# Patient Record
Sex: Male | Born: 1975 | Race: White | Hispanic: No | Marital: Single | Smoking: Never smoker
Health system: Southern US, Community
[De-identification: ages and names within clinical notes are randomized; demographics above are authoritative.]

## PROBLEM LIST (undated history)

## (undated) DIAGNOSIS — U071 COVID-19: Secondary | ICD-10-CM

## (undated) DIAGNOSIS — E663 Overweight: Secondary | ICD-10-CM

## (undated) DIAGNOSIS — F32A Depression, unspecified: Secondary | ICD-10-CM

## (undated) DIAGNOSIS — E119 Type 2 diabetes mellitus without complications: Secondary | ICD-10-CM

## (undated) DIAGNOSIS — K219 Gastro-esophageal reflux disease without esophagitis: Secondary | ICD-10-CM

## (undated) DIAGNOSIS — F329 Major depressive disorder, single episode, unspecified: Secondary | ICD-10-CM

## (undated) HISTORY — DX: Overweight: E66.3

---
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
## 2003-09-13 ENCOUNTER — Emergency Department (HOSPITAL_COMMUNITY): Admission: EM | Admit: 2003-09-13 | Discharge: 2003-09-13 | Payer: Self-pay

## 2004-02-08 ENCOUNTER — Encounter: Payer: Self-pay | Admitting: Family Medicine

## 2004-02-08 LAB — CONVERTED CEMR LAB
Eosinophils Absolute: 0.1 10*3/uL
HCT: 45.7 %
Hemoglobin: 15.8 g/dL
MCHC: 34.5 g/dL
RBC: 5.18 M/uL
RDW: 12 %
Rapid Strep: NEGATIVE

## 2004-05-19 ENCOUNTER — Ambulatory Visit: Payer: Self-pay

## 2004-05-26 ENCOUNTER — Ambulatory Visit: Payer: Self-pay | Admitting: Gastroenterology

## 2004-05-26 HISTORY — PX: ESOPHAGOGASTRODUODENOSCOPY: SHX1529

## 2005-02-05 ENCOUNTER — Ambulatory Visit: Payer: Self-pay | Admitting: Internal Medicine

## 2005-04-02 ENCOUNTER — Ambulatory Visit: Payer: Self-pay | Admitting: Family Medicine

## 2005-08-14 ENCOUNTER — Ambulatory Visit: Payer: Self-pay | Admitting: Family Medicine

## 2005-08-24 ENCOUNTER — Ambulatory Visit: Payer: Self-pay | Admitting: Family Medicine

## 2005-08-24 LAB — CONVERTED CEMR LAB
ALT: 71 units/L
LDL Cholesterol: 100 mg/dL

## 2005-08-27 ENCOUNTER — Ambulatory Visit: Payer: Self-pay | Admitting: Family Medicine

## 2005-08-28 ENCOUNTER — Encounter: Payer: Self-pay | Admitting: Family Medicine

## 2005-08-28 LAB — CONVERTED CEMR LAB
Albumin: 4.6 g/dL
BUN: 20 mg/dL
Calcium: 9.9 mg/dL
Chloride: 108 meq/L
Creatinine, Ser: 1.2 mg/dL
GFR calc non Af Amer: 76 mL/min
Glucose, Bld: 122 mg/dL
HCT: 45.8 %
Hemoglobin: 16.2 g/dL
MCHC: 35.4 g/dL
MCV: 88.2 fL
Potassium: 4.5 meq/L
RBC: 5.19 M/uL
Total Bilirubin: 1.7 mg/dL
Total Protein: 7.3 g/dL
WBC: 10.4 10*3/uL

## 2005-08-30 ENCOUNTER — Ambulatory Visit: Payer: Self-pay | Admitting: Family Medicine

## 2005-09-02 ENCOUNTER — Emergency Department (HOSPITAL_COMMUNITY): Admission: EM | Admit: 2005-09-02 | Discharge: 2005-09-02 | Payer: Self-pay | Admitting: Family Medicine

## 2005-09-28 ENCOUNTER — Ambulatory Visit: Payer: Self-pay | Admitting: Family Medicine

## 2006-07-17 ENCOUNTER — Emergency Department (HOSPITAL_COMMUNITY): Admission: EM | Admit: 2006-07-17 | Discharge: 2006-07-17 | Payer: Self-pay | Admitting: Family Medicine

## 2006-12-09 ENCOUNTER — Emergency Department: Payer: Self-pay | Admitting: Unknown Physician Specialty

## 2007-04-08 ENCOUNTER — Ambulatory Visit: Payer: Self-pay | Admitting: Family Medicine

## 2007-04-08 ENCOUNTER — Encounter (INDEPENDENT_AMBULATORY_CARE_PROVIDER_SITE_OTHER): Payer: Self-pay | Admitting: Internal Medicine

## 2007-04-08 DIAGNOSIS — R071 Chest pain on breathing: Secondary | ICD-10-CM

## 2007-04-08 DIAGNOSIS — R079 Chest pain, unspecified: Secondary | ICD-10-CM

## 2008-03-10 ENCOUNTER — Emergency Department (HOSPITAL_COMMUNITY): Admission: EM | Admit: 2008-03-10 | Discharge: 2008-03-10 | Payer: Self-pay | Admitting: Family Medicine

## 2008-03-11 ENCOUNTER — Telehealth: Payer: Self-pay | Admitting: Family Medicine

## 2008-05-19 ENCOUNTER — Ambulatory Visit: Payer: Self-pay | Admitting: Family Medicine

## 2008-05-19 DIAGNOSIS — G4700 Insomnia, unspecified: Secondary | ICD-10-CM

## 2008-05-19 LAB — CONVERTED CEMR LAB: Rapid Strep: NEGATIVE

## 2008-08-11 ENCOUNTER — Telehealth: Payer: Self-pay | Admitting: Family Medicine

## 2008-10-25 ENCOUNTER — Telehealth: Payer: Self-pay | Admitting: Family Medicine

## 2008-12-17 ENCOUNTER — Emergency Department: Payer: Self-pay | Admitting: Emergency Medicine

## 2008-12-20 ENCOUNTER — Ambulatory Visit: Payer: Self-pay | Admitting: Family Medicine

## 2008-12-20 ENCOUNTER — Encounter: Payer: Self-pay | Admitting: Family Medicine

## 2008-12-20 DIAGNOSIS — I8390 Asymptomatic varicose veins of unspecified lower extremity: Secondary | ICD-10-CM | POA: Insufficient documentation

## 2008-12-20 DIAGNOSIS — L723 Sebaceous cyst: Secondary | ICD-10-CM

## 2008-12-21 ENCOUNTER — Ambulatory Visit: Payer: Self-pay | Admitting: Family Medicine

## 2008-12-21 DIAGNOSIS — K219 Gastro-esophageal reflux disease without esophagitis: Secondary | ICD-10-CM | POA: Insufficient documentation

## 2008-12-21 DIAGNOSIS — K259 Gastric ulcer, unspecified as acute or chronic, without hemorrhage or perforation: Secondary | ICD-10-CM | POA: Insufficient documentation

## 2008-12-23 ENCOUNTER — Ambulatory Visit: Payer: Self-pay | Admitting: Family Medicine

## 2008-12-27 ENCOUNTER — Ambulatory Visit: Payer: Self-pay | Admitting: Family Medicine

## 2009-01-25 ENCOUNTER — Telehealth (INDEPENDENT_AMBULATORY_CARE_PROVIDER_SITE_OTHER): Payer: Self-pay | Admitting: Internal Medicine

## 2009-02-08 ENCOUNTER — Ambulatory Visit: Payer: Self-pay | Admitting: Family Medicine

## 2009-04-11 ENCOUNTER — Telehealth: Payer: Self-pay | Admitting: Family Medicine

## 2009-05-11 ENCOUNTER — Telehealth: Payer: Self-pay | Admitting: Family Medicine

## 2009-06-27 ENCOUNTER — Telehealth: Payer: Self-pay | Admitting: Family Medicine

## 2009-09-07 ENCOUNTER — Telehealth: Payer: Self-pay | Admitting: Family Medicine

## 2009-12-12 ENCOUNTER — Encounter: Payer: Self-pay | Admitting: Family Medicine

## 2009-12-14 ENCOUNTER — Encounter (INDEPENDENT_AMBULATORY_CARE_PROVIDER_SITE_OTHER): Payer: Self-pay | Admitting: *Deleted

## 2009-12-23 ENCOUNTER — Telehealth: Payer: Self-pay | Admitting: Family Medicine

## 2009-12-27 ENCOUNTER — Ambulatory Visit: Payer: Self-pay | Admitting: Family Medicine

## 2009-12-27 DIAGNOSIS — Z8639 Personal history of other endocrine, nutritional and metabolic disease: Secondary | ICD-10-CM | POA: Insufficient documentation

## 2009-12-27 DIAGNOSIS — E119 Type 2 diabetes mellitus without complications: Secondary | ICD-10-CM

## 2010-01-18 ENCOUNTER — Encounter: Payer: Self-pay | Admitting: Family Medicine

## 2010-01-18 ENCOUNTER — Ambulatory Visit: Payer: Self-pay | Admitting: Family Medicine

## 2010-03-06 ENCOUNTER — Encounter: Payer: Self-pay | Admitting: Family Medicine

## 2010-03-06 ENCOUNTER — Encounter (INDEPENDENT_AMBULATORY_CARE_PROVIDER_SITE_OTHER): Payer: Self-pay | Admitting: *Deleted

## 2010-04-17 ENCOUNTER — Telehealth: Payer: Self-pay | Admitting: Family Medicine

## 2010-05-30 ENCOUNTER — Encounter: Payer: Self-pay | Admitting: Family Medicine

## 2010-05-30 ENCOUNTER — Ambulatory Visit
Admission: RE | Admit: 2010-05-30 | Discharge: 2010-05-30 | Payer: Self-pay | Source: Home / Self Care | Attending: Family Medicine | Admitting: Family Medicine

## 2010-06-13 NOTE — Progress Notes (Signed)
Summary: Rx Zolipidem  Phone Note Refill Request Call back at [phone] Message from:  [facility] on June 27, 2009 8:45 AM  Refills Requested: Medication #1:  AMBIEN 10 MG TABS 1/2 to 1 tab by mouth each night for sleep as needed. To be used sparingly..   Last Refilled: 05/11/2009 Received faxed refill request, please advise   Method Requested: Telephone to Pharmacy Initial call taken by: Linde Gillis CMA Duncan Dull),  June 27, 2009 8:46 AM  Follow-up for Phone Call        Rx called to pharmacy Follow-up by: Linde Gillis CMA (AAMA),  June 27, 2009 1:20 PM    Prescriptions: AMBIEN 10 MG TABS (ZOLPIDEM TARTRATE) 1/2 to 1 tab by mouth each night for sleep as needed. To be used sparingly.  #30 x 0   Entered by:   Shaune Leeks MD   Authorized by:   Kerby Nora MD   Signed by:   Shaune Leeks MD on 06/27/2009   Method used:   Telephoned to ...       [facility] [address]       [address]       [place], [place]  [address]       Ph: [phone] or [phone]       Fax: [phone]   RxID:   [id number]

## 2010-06-13 NOTE — Progress Notes (Signed)
Summary: ambien   Phone Note Refill Request Call back at [phone] Message from:  Fax from Pharmacy on April 17, 2010 5:00 PM  Refills Requested: Medication #1:  AMBIEN 10 MG TABS 1/2 to 1 tab by mouth each night for sleep as needed. To be used sparingly.   Last Refilled: 02/26/2010 Refill request from [address]   Initial call taken by: Melody Comas,  April 17, 2010 5:01 PM  Follow-up for Phone Call        Rx called to pharmacy Follow-up by: Linde Gillis CMA Duncan Dull),  April 18, 2010 8:21 AM    Prescriptions: AMBIEN 10 MG TABS (ZOLPIDEM TARTRATE) 1/2 to 1 tab by mouth each night for sleep as needed. To be used sparingly.  #30 x 1   Entered and Authorized by:   Ruthe Mannan MD   Signed by:   Ruthe Mannan MD on 04/18/2010   Method used:   Telephoned to ...       [facility] [address]       [address], [address]       [place], [place]  [address]       Ph: [phone]       Fax: [phone]   RxID:   [id number]

## 2010-06-13 NOTE — Assessment & Plan Note (Signed)
Summary: TRANSFER FROM DR SCHALLER/[facility]   Vital Signs:  Patient profile:   35 year old male Height:      65 inches Weight:      233.50 pounds BMI:     39.00 Temp:     98.1 degrees F oral Pulse rate:   76 / minute Pulse rhythm:   regular BP sitting:   110 / 70  (left arm) Cuff size:   large  Vitals Entered By: Linde Gillis CMA Duncan Dull) (December 27, 2009 11:52 AM) CC: transfer from Dr. Hetty Ely   History of Present Illness: 35 yo here to establish care with me with complaint of ?DM.  Had fasting labs drawn at work two weeks ago: CBG 130, a1c 6.3.  fasting today, CBG 126. Since he found out, he has cut out all soft drinnks, sweets and and breads. Has a very strong family history of DM.  Has had increased thirst and increased urination. No obvious fungal infections.  Current Medications (verified): 1)  Protonix 40 Mg  Tbec (Pantoprazole Sodium) .... Take 1 Tablet By Mouth Once A Day 2)  Ambien 10 Mg Tabs (Zolpidem Tartrate) .... 1/2 To 1 Tab By Mouth Each Night For Sleep As Needed. To Be Used Sparingly. 3)  Metformin Hcl 500 Mg Tabs (Metformin Hcl) .... Take 1 Tab By Mouth Daily 4)  Onetouch Lancets  Misc (Lancets) .... Use As Directed  Allergies (verified): No Known Drug Allergies  Past History:  Past Surgical History: Last updated: 12/20/2008 Appendectomy  1988 EGD  Esophagitis (Dr. 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 Eyes:  Denies blurring. GI:  Denies nausea and vomiting. Endo:  Complains of excessive thirst and excessive urination.  Physical Exam  General:  alert, well-developed, well-nourished, well-hydrated, and overweight-appearing.  NAD Lungs:  Normal respiratory effort, chest expands  symmetrically. Lungs are clear to auscultation, no crackles or wheezes. Heart:  Normal rate and regular rhythm. S1 and S2 normal without gallop, murmur, click, rub or other extra sounds. Psych:  Cognition and judgment appear intact. Alert and cooperative with normal attention span and concentration. No apparent delusions, illusions, hallucinations   Impression & Recommendations:  Problem # 1:  DM (ICD-250.00) Assessment New Time spent with patient 25 minutes, more than 50% of this time was spent counseling patient on diabetes. At this point, pt is prediabetic/diabetic based on guidelines of fasting CBG >126 (which he was at his office), he would be considered a diabetic.  CBG today is 126, a1c 6.3. Start Metformin 500 mg daily, given a glucometer and refer to diabetic nutrition counselling.  His updated medication list for this problem includes:    Metformin Hcl 500 Mg Tabs (Metformin hcl) .Marland Kitchen... Take 1 tab by mouth daily  Orders: Nutrition Referral (Nutrition)  Complete Medication List: 1)  Protonix 40 Mg Tbec (Pantoprazole sodium) .... Take 1 tablet by mouth once a day 2)  Ambien 10 Mg Tabs (Zolpidem tartrate) .... 1/2 to 1 tab by mouth each night for sleep as needed. to be used sparingly. 3)  Metformin Hcl 500 Mg Tabs (Metformin hcl) .... Take 1 tab by mouth daily 4)  Onetouch Lancets Misc (Lancets) .... Use as directed  Patient Instructions: 1)  great to see you. 2)  please stop by to see Shirlee Limerick  on your way out. 3)  Make a follow up appointment in 2 months, sooner if you feel bad. Prescriptions: AMBIEN 10 MG TABS (ZOLPIDEM TARTRATE) 1/2 to 1 tab by mouth each night for sleep as needed. To be used sparingly.  #30 x 1   Entered and Authorized by:   Ruthe Mannan MD   Signed by:   Ruthe Mannan MD on 12/27/2009   Method used:   Print then Give to Patient   RxID:   [id number] [facility] LANCETS  MISC (LANCETS) Use as directed  #100 x 1   Entered and Authorized by:   Ruthe Mannan MD    Signed by:   Ruthe Mannan MD on 12/27/2009   Method used:   [facility]        [facility] [address]       [address]       [place], [place]  [address]       Ph: [phone]       Fax: [phone]   RxID:   [id number] METFORMIN HCL 500 MG TABS (METFORMIN HCL) Take 1 tab by mouth daily  #30 x 3   Entered and Authorized by:   Ruthe Mannan MD   Signed by:   Ruthe Mannan MD on 12/27/2009   Method used:   [facility]        [facility] [address]       [address], [address]       [place], [place]  [address]       Ph: [phone]       Fax: [phone]   RxID:   [id number]   Current Allergies (reviewed today): No known allergies

## 2010-06-13 NOTE — Miscellaneous (Signed)
Summary: med list update- test strips  Medications Added ONETOUCH ULTRA BLUE  STRP (GLUCOSE BLOOD) Check blood sugar as directed       Clinical Lists Changes  Medications: Added new medication of ONETOUCH ULTRA BLUE  STRP (GLUCOSE BLOOD) Check blood sugar as directed     Prior Medications: PROTONIX 40 MG  TBEC (PANTOPRAZOLE SODIUM) Take 1 tablet by mouth once a day AMBIEN 10 MG TABS (ZOLPIDEM TARTRATE) 1/2 to 1 tab by mouth each night for sleep as needed. To be used sparingly. METFORMIN HCL 500 MG TABS (METFORMIN HCL) Take 1 tab by mouth daily ONETOUCH LANCETS  MISC (LANCETS) Use as directed ONETOUCH ULTRA BLUE  STRP (GLUCOSE BLOOD) Check blood sugar as directed Current Allergies: No known allergies

## 2010-06-13 NOTE — Progress Notes (Signed)
Summary: refill request for ambien  Phone Note Refill Request Message from:  Fax from Pharmacy  Refills Requested: Medication #1:  AMBIEN 10 MG TABS 1/2 to 1 tab by mouth each night for sleep as needed. To be used sparingly..   Last Refilled: 06/27/2009 [facility] [address], [phone].  Initial call taken by: Lowella Petties CMA,  September 07, 2009 8:43 AM  Follow-up for Phone Call        [facility] Follow-up by: Lowella Petties CMA,  September 07, 2009 10:40 AM    Prescriptions: AMBIEN 10 MG TABS (ZOLPIDEM TARTRATE) 1/2 to 1 tab by mouth each night for sleep as needed. To be used sparingly.  #30 x 1   Entered and Authorized by:   Shaune Leeks MD   Signed by:   Shaune Leeks MD on 09/07/2009   Method used:   Telephoned to ...       [facility] [address]       [address]       [place], [place]  [address]       Ph: [phone]       Fax: [phone]   RxID:   [id number]

## 2010-06-13 NOTE — Letter (Signed)
Summary: Nadara Eaton letter  [place] at [facility]  [address] [place], [place] [address]   Phone: [phone]  Fax: [phone]       12/14/2009 MRN: [id number]  STACIE KNUTZEN [address] [place], [place]  [address]  Dear Mr. Irven Shelling [facility] [place], and [place] announce the retirement of Arta Silence, M.D., from full-time practice at the [facility] office effective November 10, 2009 and his plans of returning part-time.  It is important to Dr. Hetty Ely and to our practice that you understand that [facility] has seven physicians in our office for your health care needs.  We will continue to offer the same exceptional care that you have today.    Dr. Hetty Ely has spoken to many of you about his plans for retirement and returning part-time in the fall.   We will continue to work with you through the transition to schedule appointments for you in the office and meet the high standards that [place] is committed to.   Again, it is with great pleasure that we share the news that Dr. Hetty Ely will return to [facility] in October of 2011 with a reduced schedule.    If you have any questions, or would like to request an appointment with one of our physicians, please call us at [phone] and press the option for Scheduling an appointment.  We take pleasure in providing you with excellent patient care and look forward to seeing you at your next office visit.  Our [facility] Physicians are:  Tillman Abide, M.D. Laurita Quint, M.D. Roxy Manns, M.D. Kerby Nora, M.D. Hannah Beat, M.D. Ruthe Mannan, M.D. We proudly welcomed Raechel Ache, M.D. and Eustaquio Boyden, M.D. to the practice in July/August 2011.  Sincerely,   [facility]

## 2010-06-13 NOTE — Progress Notes (Signed)
  Phone Note Call from Patient Call back at [phone]   Caller: Patient Call For: Dr.Aron Summary of Call: Pt. would like to switch from Dr.Schaller to you.  His wife,Cassa,is a pt. of yours.  They'd like to have the same doctor.  Pt is having trouble w/ his sugar and needs to be seen on Monday.  Can pt. switch to you? Initial call taken by: Beau Fanny,  December 23, 2009 1:20 PM  Follow-up for Phone Call        yes, please make a 30 min appt. Follow-up by: Ruthe Mannan MD,  December 23, 2009 1:43 PM  Additional Follow-up for Phone Call Additional follow up Details #1::        Pt. scheduled 30 min. appt. on 12/27/09 @ 12:15. Additional Follow-up by: Beau Fanny,  December 23, 2009 2:03 PM

## 2010-06-13 NOTE — Consult Note (Signed)
Summary: [facility]   Imported By: Sherian Rein 01/27/2010 11:35:58  _____________________________________________________________________  External Attachment:    Type:   Image     Comment:   External Document

## 2010-06-13 NOTE — Letter (Signed)
Summary: Unable to Contact Patient/[facility]  Unable to Contact Patient/[facility]   Imported By: Lanelle Bal 03/15/2010 09:03:54  _____________________________________________________________________  External Attachment:    Type:   Image     Comment:   External Document

## 2010-06-15 ENCOUNTER — Other Ambulatory Visit (HOSPITAL_COMMUNITY): Payer: Self-pay | Admitting: General Surgery

## 2010-06-15 ENCOUNTER — Encounter: Payer: Self-pay | Admitting: Family Medicine

## 2010-06-15 NOTE — Assessment & Plan Note (Signed)
Summary: PAPERWORK FOR BARIATRIC SURGERY / [facility]   Vital Signs:  Patient profile:   35 year old male Height:      65 inches Weight:      242.50 pounds BMI:     40.50 Temp:     98.5 degrees F oral Pulse rate:   80 / minute Pulse rhythm:   regular BP sitting:   110 / 70  (left arm) Cuff size:   large  Vitals Entered By: Linde Gillis CMA Duncan Dull) (May 30, 2010 11:37 AM) CC: fill out paper work for bariatric surgery   History of Present Illness: 35 yo here to fill out his paperwork for bariatric surgery.  Has already attended the orientation at [facility] and feels he would do very well with lap band surgery. Knows several people who have had it done.  Brings in forms to fill out.  Pt has tried weight watchers, ATkins diet, phenteramine, Slim fast and other diets with no success.  He recently was diagnosed with DM and has a very strong FH of diabetes as well.  He is very motivated to make this work!  This is the most he has ever weighed in his life.  Current Medications (verified): 1)  Protonix 40 Mg  Tbec (Pantoprazole Sodium) .... Take 1 Tablet By Mouth Once A Day 2)  Ambien 10 Mg Tabs (Zolpidem Tartrate) .... 1/2 To 1 Tab By Mouth Each Night For Sleep As Needed. To Be Used Sparingly. 3)  Metformin Hcl 500 Mg Tabs (Metformin Hcl) .... Take 1 Tab By Mouth Daily 4)  Onetouch Lancets  Misc (Lancets) .... Use As Directed 5)  Onetouch Ultra Blue  Strp (Glucose Blood) .... Check Blood Sugar As Directed  Allergies (verified): No Known Drug Allergies  Past History:  Past Surgical History: Last updated: 12/20/2008 Appendectomy  1988 EGD  Esophagitis (Dr. 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 MS:  Complains of joint pain; denies  joint redness and joint swelling.  Physical Exam  General:  alert, well-developed, well-nourished, well-hydrated, and overweight-appearing.  NAD Psych:  Cognition and judgment appear intact. Alert and cooperative with normal attention span and concentration. No apparent delusions, illusions, hallucinations   Impression & Recommendations:  Problem # 1:  OBESITY, MORBID (ICD-278.01) Assessment Deteriorated  Time spent with patient 25 minutes, more than 50% of this time was spent counseling patient on his weight and previous weight loss attempts.  Forms filled out and letter written to [facility].  Copy given to patient and faxed to [facility] as well.  Orders: Form Completion (295[address])  Complete Medication List: 1)  Protonix 40 Mg Tbec (Pantoprazole sodium) .... Take 1 tablet by mouth once a day 2)  Ambien 10 Mg Tabs (Zolpidem tartrate) .... 1/2 to 1 tab by mouth each night for sleep as needed. to be used sparingly. 3)  Metformin Hcl 500 Mg Tabs (Metformin hcl) .... Take 1 tab by mouth daily 4)  Onetouch Lancets Misc (Lancets) .... Use as directed 5)  Onetouch Ultra Blue Strp (Glucose blood) .... Check blood sugar as directed   Orders Added: 1)  Est. Patient Level IV [84166] 2)  Form Completion [06301]    Current Allergies (reviewed today): No known allergies

## 2010-06-15 NOTE — Letter (Signed)
Summary: *Referral Letter  [place] [facility]  [address] [place], [place] [address]   Phone: [phone]  Fax: [phone]    05/30/2010  Thank you in advance for agreeing to see my patient:  Eric [name] [address] [place], [place]  [address]  Phone: [phone]  To whom it may concern:  The above named patient has been seen in our office for 10 years.  He suffers from Diabetes.  His current weight is 242.5 pounds, BMI 40.50.  The patient has undergone several weight loss attempts, including diet and exercise, specifically the Atkins diet, Weight watchers, Slim Fast, Phenteramine, It works Diet.  I feel this patient would benefit from weight loss surgery because he is very motivated but has been unsuccessful with previous attempts.  He needs to get his weight under control to help improve his other medical condition, diabetes.   I appreciate your consideration and please contact me with any questions.  Sincerely,  Ruthe Mannan MD

## 2010-06-20 ENCOUNTER — Encounter: Payer: BC Managed Care – PPO | Attending: General Surgery | Admitting: *Deleted

## 2010-06-20 ENCOUNTER — Encounter: Payer: Self-pay | Admitting: Family Medicine

## 2010-06-20 DIAGNOSIS — Z01818 Encounter for other preprocedural examination: Secondary | ICD-10-CM | POA: Insufficient documentation

## 2010-06-20 DIAGNOSIS — Z713 Dietary counseling and surveillance: Secondary | ICD-10-CM | POA: Insufficient documentation

## 2010-06-28 ENCOUNTER — Ambulatory Visit (HOSPITAL_COMMUNITY)
Admission: RE | Admit: 2010-06-28 | Discharge: 2010-06-28 | Disposition: A | Discharge status: Home / Self Care | Payer: BC Managed Care – PPO | Source: Ambulatory Visit | Attending: General Surgery | Admitting: General Surgery

## 2010-06-28 DIAGNOSIS — E119 Type 2 diabetes mellitus without complications: Secondary | ICD-10-CM | POA: Insufficient documentation

## 2010-06-28 DIAGNOSIS — K219 Gastro-esophageal reflux disease without esophagitis: Secondary | ICD-10-CM | POA: Insufficient documentation

## 2010-06-28 DIAGNOSIS — Z6841 Body Mass Index (BMI) 40.0 and over, adult: Secondary | ICD-10-CM | POA: Insufficient documentation

## 2010-06-30 ENCOUNTER — Other Ambulatory Visit (HOSPITAL_COMMUNITY): Payer: Self-pay

## 2010-07-04 ENCOUNTER — Telehealth: Payer: Self-pay | Admitting: Family Medicine

## 2010-07-05 NOTE — Letter (Signed)
[facility]   Imported By: Maryln Gottron 06/27/2010 14:12:07  _____________________________________________________________________  External Attachment:    Type:   Image     Comment:   External Document

## 2010-07-05 NOTE — Consult Note (Signed)
Summary: [facility]   Imported By: Maryln Gottron 06/27/2010 15:37:44  _____________________________________________________________________  External Attachment:    Type:   Image     Comment:   External Document

## 2010-07-11 NOTE — Progress Notes (Signed)
Summary: Eric [name]  Phone Note Refill Request Message from:  Fax from Pharmacy on July 04, 2010 8:51 AM  Refills Requested: Medication #1:  AMBIEN 10 MG TABS 1/2 to 1 tab by mouth each night for sleep as needed. To be used sparingly.   Last Refilled: 05/26/2010 [facility] [address] [phone].  Initial call taken by: Melody Comas,  July 04, 2010 8:52 AM  Follow-up for Phone Call        Rx called to pharmacy Follow-up by: Linde Gillis CMA Duncan Dull),  July 04, 2010 9:04 AM    Prescriptions: AMBIEN 10 MG TABS (ZOLPIDEM TARTRATE) 1/2 to 1 tab by mouth each night for sleep as needed. To be used sparingly.  #30 x 1   Entered and Authorized by:   Ruthe Mannan MD   Signed by:   Ruthe Mannan MD on 07/04/2010   Method used:   [facility]phoned to ...       [facility] [address]       [address], [address]       [place], [place]  [address]       Ph: [phone]       Fax: [phone]   RxID:   [id number]

## 2010-07-14 ENCOUNTER — Encounter: Payer: Self-pay | Admitting: Family Medicine

## 2010-07-14 ENCOUNTER — Ambulatory Visit (INDEPENDENT_AMBULATORY_CARE_PROVIDER_SITE_OTHER): Payer: BC Managed Care – PPO | Admitting: Family Medicine

## 2010-07-14 DIAGNOSIS — L723 Sebaceous cyst: Secondary | ICD-10-CM

## 2010-07-20 NOTE — Assessment & Plan Note (Signed)
Summary: check lumps on left shoulder,back of head/cle  bcbs   Vital Signs:  Patient profile:   35 year old male Weight:      245.50 pounds Temp:     98.5 degrees F oral Pulse rate:   84 / minute Pulse rhythm:   regular BP sitting:   110 / 70  (left arm) Cuff size:   large  Vitals Entered By: Selena Batten Dance CMA Duncan Dull) (July 14, 2010 3:23 PM) CC: check lumps on shoulder and head   History of Present Illness: CC: lump on shoulder  presents with wife.  2 lumps, one on midline neck and one on left shoulder.    Has had for years. Previously one on shoulder drained here twice, told sebaceous cyst, came back.  going on for 4 years.  midline on neck, has been there for 1 year, never drained.  Irritated, tender, not draining.  No fevers/chills, abd pain.    requests refill metformin.  Current Medications (verified): 1)  Protonix 40 Mg  Tbec (Pantoprazole Sodium) .... Take 1 Tablet By Mouth Once A Day 2)  Ambien 10 Mg Tabs (Zolpidem Tartrate) .... 1/2 To 1 Tab By Mouth Each Night For Sleep As Needed. To Be Used Sparingly. 3)  Metformin Hcl 500 Mg Tabs (Metformin Hcl) .... Take 1 Tab By Mouth Daily 4)  Onetouch Lancets  Misc (Lancets) .... Use As Directed 5)  Onetouch Ultra Blue  Strp (Glucose Blood) .... Check Blood Sugar As Directed  Allergies (verified): No Known Drug Allergies  Past History:  Past Surgical History: Last updated: 12/20/2008 Appendectomy  1988 EGD  Esophagitis (Dr. 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  NAD Skin:  L shoulder with soft nodule about 2cm, lateral to scar from previous procedure, slight erythema, nontender to palpation.  scant amt clear serous discharge  from 2 pores.  currently no fluctuance.  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  currently not infected, but seems some inflammed.  treat with abx to decrease inflammation, return 1-2 wks hopeful for easier complete excision.  lesion on neck, ? LN vs another epidermal cyst.  consider removal at next visit as well.  Complete Medication List: 1)  Protonix 40 Mg Tbec (Pantoprazole sodium) .... Take 1 tablet by mouth once a day 2)  Ambien 10 Mg Tabs (Zolpidem tartrate) .... 1/2 to 1 tab by mouth each night for sleep as needed. to be used sparingly. 3)  Metformin Hcl 500 Mg Tabs (Metformin hcl) .... Take 1 tab by mouth daily 4)  Onetouch Lancets Misc (Lancets) .... Use as directed 5)  Onetouch Ultra Blue Strp (Glucose blood) .... Check blood sugar as directed 6)  Doxycycline Hyclate 100 Mg Caps (Doxycycline hyclate) .... Take one twice daily for 7 days  Patient Instructions: 1)  looks like inflamed epiderma inclusion cyst.  2)  course of antibiotics for now. 3)  return when finished with course for removal.  (return next week) Prescriptions: METFORMIN HCL 500 MG TABS (METFORMIN HCL) Take 1 tab by mouth daily  #90 x 1   Entered and Authorized by:   Wynona Canes  Sharen Hones  MD   Signed by:   Eustaquio Boyden  MD on 07/14/2010   Method used:   [facility] [address]       [address], [address]       [place], [place]  [address]       Ph: [phone]       Fax: [phone]   RxID:   [id number] DOXYCYCLINE HYCLATE 100 MG CAPS (DOXYCYCLINE HYCLATE) take one twice daily for 7 days  #14 x 0   Entered and Authorized by:   Eustaquio Boyden  MD   Signed by:   Eustaquio Boyden  MD on 07/14/2010   Method used:   [facility] [address]       [address], [address]       [place], [place]  [address]       Ph: [phone]       Fax: [phone]   RxID:   [id number]    Orders Added: 1)  Est. Patient Level III [88416]    Current Allergies (reviewed today): No known allergies

## 2010-07-21 ENCOUNTER — Ambulatory Visit (INDEPENDENT_AMBULATORY_CARE_PROVIDER_SITE_OTHER): Payer: BC Managed Care – PPO | Admitting: Family Medicine

## 2010-07-21 ENCOUNTER — Encounter: Payer: Self-pay | Admitting: Family Medicine

## 2010-07-21 DIAGNOSIS — L723 Sebaceous cyst: Secondary | ICD-10-CM

## 2010-07-25 NOTE — Assessment & Plan Note (Signed)
Summary: ONE WEEK FOLLOW UP / LFW - I&D   Vital Signs:  Patient profile:   35 year old male Weight:      248.25 pounds Temp:     97.7 degrees F oral Pulse rate:   84 / minute Pulse rhythm:   regular BP sitting:   122 / 80  (left arm) Cuff size:   large  Vitals Entered By: Selena Batten Dance CMA Duncan Dull) (July 21, 2010 12:06 PM) CC: 1 week follow up   History of Present Illness: see prior OV.  seen last week with inflammed epidermal inclusion cyst on L shoulder.  treated with abx, returns today for I&D.  wife actually drain some material out of it earlier this week.  staying erythematous, not as tender but still a bother.  IC obtained and in chart.    Allergies (verified): No Known Drug Allergies  Physical Exam  Skin:  L shoulder with soft nodule about 1.5cm, lateral to scar from previous procedure, slight erythema, nontender to palpation.  fluctuant.  midline posterior occipital scalp with induratd nodule about 1cm diameter, no tenderness   Impression & Recommendations:  Problem # 1:  SEBACEOUS CYST, NECK (ICD-706.2) unable to fully remove cyst wall.  discussed possibility of recurrence.  Orders: I&D Abscess, Complex (10061)  Complete Medication List: 1)  Protonix 40 Mg Tbec (Pantoprazole sodium) .... Take 1 tablet by mouth once a day 2)  Ambien 10 Mg Tabs (Zolpidem tartrate) .... 1/2 to 1 tab by mouth each night for sleep as needed. to be used sparingly. 3)  Metformin Hcl 500 Mg Tabs (Metformin hcl) .... Take 1 tab by mouth daily 4)  Onetouch Lancets Misc (Lancets) .... Use as directed 5)  Onetouch Ultra Blue Strp (Glucose blood) .... Check blood sugar as directed 6)  Vicodin 5-500 Mg Tabs (Hydrocodone-acetaminophen) .... Take one by mouth q6 hour as needed breakthrough pain  Patient Instructions: 1)  Keep area covered for 24 hours.   2)  Return mid next week for recheck. 3)  vicodin for pain not controlled with tylenol/ibuprofen. 4)  If fever, uncontrolled pain, draining  pus or spreading redness please return sooner. Prescriptions: VICODIN 5-500 MG TABS (HYDROCODONE-ACETAMINOPHEN) take one by mouth q6 hour as needed breakthrough pain  #20 x 0   Entered and Authorized by:   Eustaquio Boyden  MD   Signed by:   Eustaquio Boyden  MD on 07/21/2010   Method used:   Print then Give to Patient   RxID:   [id number]    Orders Added: 1)  I&D Abscess, Complex [10061]     Procedure Note  Incision & Drainage: The patient complains of redness, irritation, inflammation, tenderness, and swelling. Onset of lesion: > 3 months Indication: inflamed lesion Consent signed: yes  Procedure # 1: I & D with packing    Size (in cm): 1.0 x 1.0    Region: posterior    Location: back-upper-left    Comment: cleaned with betadine, anesthesia achieved wtih lidocaine with epi and buffered with NaHCO3.  incision along langer's line with 11 scalpel.  large amt caseous material expressed.  minimal blood loss.  cleaned out with saline.  packed with iodoform gauze about 1 ft.  dressed.  no culture as currently on abx.  pt tolerated procedure well.  wound care discussed    Instrument used: #11 blade    Anesthesia: 1% lidocaine w/epinephrine  Cleaned and prepped with: betadine Wound dressing: pressure dressing Instructions: daily dressing changes  Current Allergies (reviewed  today): No known allergies

## 2010-08-01 NOTE — Miscellaneous (Signed)
Summary: Consent to Procedure   Consent to Procedure   Imported By: Kassie Mends 07/25/2010 11:49:42  _____________________________________________________________________  External Attachment:    Type:   Image     Comment:   External Document

## 2010-08-13 HISTORY — PX: LAPAROSCOPIC GASTRIC BANDING: SHX1100

## 2010-08-15 ENCOUNTER — Ambulatory Visit: Payer: BC Managed Care – PPO | Admitting: *Deleted

## 2010-08-17 ENCOUNTER — Ambulatory Visit: Payer: BC Managed Care – PPO

## 2010-08-22 ENCOUNTER — Encounter: Payer: BC Managed Care – PPO | Attending: General Surgery | Admitting: *Deleted

## 2010-08-22 DIAGNOSIS — Z01818 Encounter for other preprocedural examination: Secondary | ICD-10-CM | POA: Insufficient documentation

## 2010-08-22 DIAGNOSIS — Z713 Dietary counseling and surveillance: Secondary | ICD-10-CM | POA: Insufficient documentation

## 2010-08-29 ENCOUNTER — Other Ambulatory Visit: Payer: Self-pay | Admitting: General Surgery

## 2010-08-29 ENCOUNTER — Encounter (HOSPITAL_COMMUNITY): Payer: BC Managed Care – PPO

## 2010-08-29 DIAGNOSIS — Z01818 Encounter for other preprocedural examination: Secondary | ICD-10-CM | POA: Insufficient documentation

## 2010-08-29 DIAGNOSIS — Z01812 Encounter for preprocedural laboratory examination: Secondary | ICD-10-CM | POA: Insufficient documentation

## 2010-08-29 DIAGNOSIS — Z01811 Encounter for preprocedural respiratory examination: Secondary | ICD-10-CM | POA: Insufficient documentation

## 2010-08-29 LAB — COMPREHENSIVE METABOLIC PANEL
AST: 40 U/L — ABNORMAL HIGH (ref 0–37)
CO2: 29 mEq/L (ref 19–32)
Calcium: 9.5 mg/dL (ref 8.4–10.5)
Creatinine, Ser: 1.05 mg/dL (ref 0.4–1.5)
GFR calc Af Amer: >60 mL/min (ref >60)
GFR calc non Af Amer: >60 mL/min (ref >60)
Glucose, Bld: 225 mg/dL — ABNORMAL HIGH (ref 70–99)
Total Protein: 7.2 g/dL (ref 6.0–8.3)

## 2010-08-29 LAB — DIFFERENTIAL
Basophils Absolute: 0 10*3/uL (ref 0.0–0.1)
Basophils Relative: 0 % (ref 0–1)
Eosinophils Absolute: 0.2 10*3/uL (ref 0.0–0.7)
Lymphs Abs: 1.9 10*3/uL (ref 0.7–4.0)
Monocytes Absolute: 0.7 10*3/uL (ref 0.1–1.0)
Monocytes Relative: 8 % (ref 3–12)
Neutrophils Relative %: 67 % (ref 43–77)

## 2010-08-29 LAB — CBC
MCH: 30.8 pg (ref 26.0–34.0)
MCHC: 35.8 g/dL (ref 30.0–36.0)
RBC: 5.09 MIL/uL (ref 4.22–5.81)
WBC: 8.6 10*3/uL (ref 4.0–10.5)

## 2010-08-29 LAB — SURGICAL PCR SCREEN: Staphylococcus aureus: NEGATIVE

## 2010-09-05 ENCOUNTER — Ambulatory Visit (HOSPITAL_COMMUNITY)
Admission: RE | Admit: 2010-09-05 | Discharge: 2010-09-06 | Disposition: A | Discharge status: Home / Self Care | Payer: BC Managed Care – PPO | Source: Ambulatory Visit | Attending: General Surgery | Admitting: General Surgery

## 2010-09-05 DIAGNOSIS — E119 Type 2 diabetes mellitus without complications: Secondary | ICD-10-CM | POA: Insufficient documentation

## 2010-09-05 DIAGNOSIS — Z6841 Body Mass Index (BMI) 40.0 and over, adult: Secondary | ICD-10-CM | POA: Insufficient documentation

## 2010-09-05 DIAGNOSIS — Z79899 Other long term (current) drug therapy: Secondary | ICD-10-CM | POA: Insufficient documentation

## 2010-09-05 DIAGNOSIS — R1013 Epigastric pain: Secondary | ICD-10-CM | POA: Insufficient documentation

## 2010-09-05 DIAGNOSIS — K219 Gastro-esophageal reflux disease without esophagitis: Secondary | ICD-10-CM | POA: Insufficient documentation

## 2010-09-05 DIAGNOSIS — Z01812 Encounter for preprocedural laboratory examination: Secondary | ICD-10-CM | POA: Insufficient documentation

## 2010-09-05 DIAGNOSIS — M2550 Pain in unspecified joint: Secondary | ICD-10-CM | POA: Insufficient documentation

## 2010-09-05 LAB — GLUCOSE, CAPILLARY
Glucose-Capillary: 145 mg/dL — ABNORMAL HIGH (ref 70–99)
Glucose-Capillary: 180 mg/dL — ABNORMAL HIGH (ref 70–99)
Glucose-Capillary: 202 mg/dL — ABNORMAL HIGH (ref 70–99)
Glucose-Capillary: 168 mg/dL — ABNORMAL HIGH (ref 70–99)

## 2010-09-06 ENCOUNTER — Ambulatory Visit (HOSPITAL_COMMUNITY): Payer: BC Managed Care – PPO

## 2010-09-06 LAB — DIFFERENTIAL
Eosinophils Relative: 1 % (ref 0–5)
Lymphocytes Relative: 19 % (ref 12–46)
Monocytes Absolute: 1.2 10*3/uL — ABNORMAL HIGH (ref 0.1–1.0)
Neutro Abs: 8.6 10*3/uL — ABNORMAL HIGH (ref 1.7–7.7)

## 2010-09-06 LAB — CBC
MCH: 30.2 pg (ref 26.0–34.0)
MCHC: 34.8 g/dL (ref 30.0–36.0)
MCV: 87 fL (ref 78.0–100.0)
Platelets: 239 10*3/uL (ref 150–400)

## 2010-09-06 LAB — GLUCOSE, CAPILLARY: Glucose-Capillary: 166 mg/dL — ABNORMAL HIGH (ref 70–99)

## 2010-09-06 NOTE — Op Note (Signed)
NAME:  Eric [name]                ACCOUNT NO.:  [id number]  MEDICAL RECORD NO.:  [id number]           PATIENT TYPE:  O  LOCATION:  DAYL                         FACILITY:  [facility]  PHYSICIAN:  Sharlet Salina T. Rasheen Bells, M.D.DATE OF BIRTH:  Mar 08, 1976  DATE OF PROCEDURE:  09/05/2010 DATE OF DISCHARGE:                              OPERATIVE REPORT   PREOPERATIVE DIAGNOSIS:  Morbid obesity.  POSTOPERATIVE DIAGNOSIS:  Morbid obesity.  SURGICAL PROCEDURE:  Placement of laparoscopic adjustable gastric band.  SURGEON:  Lorne Skeens. Monisha Siebel, MD  ANESTHESIA:  General.  BRIEF HISTORY:  Eric [name] is a 35 year old male with a number of years history of progressive morbid obesity unresponsive to multiple efforts at nonsurgical management.  After extensive preoperative discussion regarding alternatives and risks detailed elsewhere, we elected to proceed with placement of laparoscopic adjustable gastric band for treatment of his obesity.  He presents is at 5 feet 4 inches, 243 pounds, BMI of 41.4 with comorbidities of adult onset diabetes mellitus, reflux, and joint pain.  DESCRIPTION OF OPERATION:  The patient was brought to the operating room, placed in supine position on the operating table, and general endotracheal anesthesia was induced.  He had received preoperative IV antibiotics and subcutaneous heparin.  PAS were placed.  The abdomen was widely and sterilely prepped and draped and correct patient and procedure were verified.  Local anesthesia was used to infiltrate the trocar sites.  Access was obtained with a 12 mm OptiVu trocar in the left upper quadrant midclavicular line without difficulty and pneumoperitoneum established.  There is no evidence of trocar injury. Under direct vision, a 15 mm trocar was placed laterally in the right upper quadrant.  Another 12 mm trocar in right upper quadrant midclavicular line and 12 mm trocar just above left umbilicus for the camera port.  The  patient was placed in steep reverse Trendelenburg and through a 5 mm subxiphoid site, the [facility] retractor was placed and left lobe of the liver elevated with good exposure of the hiatus and upper stomach.  The liver was mildly enlarged and fatty but not difficulty as far as exposure.  There was quite a lot of fat around the upper stomach and hiatus and the gastrohepatic omentum was quite fatty and I chose an AP large band system.  Initially, due to the patient's history of reflux, although we had a normal upper GI series, the sizing balloon was introduced orally into the stomach and the balloon inflated to 10 mL and brought back snugly against the hiatus with no evidence of herniation. Balloon was deflated and the tube pulled into the mid esophagus. Initially, the angle of His was exposed.  The peritoneum overlying the left crus opened and the careful blunt dissection carried down toward the retrogastric space with the finger dissector.  Following this, the gastrohepatic omentum was opened with the hook cautery and the right crus exposed and the base of the right crus, there are crossing fat clearly defined.  The peritoneum here was incised and then using the finger dissector, a careful blunt dissection was carried back retrogastrically with the finger dissector which was then deployed  up to the previous dissector of angle of His without difficulty.  The AP large flushed band system was introduced in the abdomen and tubing placed and the finger dissector was brought back retrogastric.  The band was then brought back through the retrogastric tunnel without difficulty.  The sizing tube was introduced back into the stomach and with that in place, the band was snapped into place without any undue tension.  The sizing tube was removed.  Holding tubing toward the patient's feet, the fundus was imbricated up over the band to the small gastric pouch with 3 interrupted 2-0 Ethibond sutures.  Band  appeared to be in excellent position.  There was no bleeding or evidence of trocar injury or other problems.  This nasal retractor was removed.  The tubing brought up through the right mid abdominal site and all CO2 evacuated.  Trocars removed.  This right mid abdominal incision was lengthened slightly and subcutaneous pocket created.  The tubing was cut and attached to the port, which piece of Prolene mesh was sutured back and the port was positioned subcutaneously just lateral to the incision.  The tubing introduced mainly into the abdomen.  The subcu here was closed with running 3-0 Vicryl.  Skin incisions were closed with subcuticular Monocryl and Dermabond.  Sponge and needle counts were correct.  The patient taken to recovery in good condition.     Lorne Skeens. Shivonne Schwartzman, M.D.     Tory Emerald  D:  09/05/2010  T:  09/05/2010  Job:  [id number]  Electronically Signed by Glenna Fellows M.D. on 09/06/2010 11:03:33 AM

## 2010-09-08 LAB — GLUCOSE, CAPILLARY: Glucose-Capillary: 158 mg/dL — ABNORMAL HIGH (ref 70–99)

## 2010-09-11 ENCOUNTER — Encounter: Payer: BC Managed Care – PPO | Admitting: *Deleted

## 2010-10-23 ENCOUNTER — Ambulatory Visit: Payer: BC Managed Care – PPO | Admitting: *Deleted

## 2010-10-31 ENCOUNTER — Encounter: Payer: Self-pay | Admitting: Family Medicine

## 2010-11-02 ENCOUNTER — Encounter: Payer: Self-pay | Admitting: Family Medicine

## 2010-11-02 ENCOUNTER — Other Ambulatory Visit: Payer: Self-pay | Admitting: Family Medicine

## 2010-11-02 ENCOUNTER — Ambulatory Visit (INDEPENDENT_AMBULATORY_CARE_PROVIDER_SITE_OTHER): Payer: BC Managed Care – PPO | Admitting: Family Medicine

## 2010-11-02 DIAGNOSIS — F411 Generalized anxiety disorder: Secondary | ICD-10-CM

## 2010-11-02 DIAGNOSIS — F419 Anxiety disorder, unspecified: Secondary | ICD-10-CM

## 2010-11-02 DIAGNOSIS — E119 Type 2 diabetes mellitus without complications: Secondary | ICD-10-CM

## 2010-11-02 DIAGNOSIS — F329 Major depressive disorder, single episode, unspecified: Secondary | ICD-10-CM | POA: Insufficient documentation

## 2010-11-02 MED ORDER — FLUOXETINE HCL 20 MG PO CAPS: 20.0000 mg | ORAL_CAPSULE | Freq: Every day | ORAL | Status: DC

## 2010-11-02 NOTE — Patient Instructions (Signed)
What is this medicine? FLUOXETINE (floo OX e teen) belongs to a class of drugs known as selective serotonin reuptake inhibitors (SSRIs). It helps to treat mood problems such as depression, obsessive compulsive disorder, and panic attacks. It can also treat certain eating disorders. This medicine may be used for other purposes; ask your health care provider or pharmacist if you have questions.   What should I tell my health care provider before I take this medicine? They need to know if you have any of these conditions: -bipolar disorder or mania -diabetes -glaucoma -liver disease -psychosis -seizures -suicidal thoughts or history of attempted suicide -an unusual or allergic reaction to fluoxetine, other medicines, foods, dyes, or preservatives -pregnant or trying to get pregnant -breast-feeding   How should I use this medicine? Take this medicine by mouth with a glass of water. Follow the directions on the prescription label. You can take this medicine with or without food. Take your medicine at regular intervals. Do not take it more often than directed. Do not stop taking except on your doctor's advice.   A special MedGuide will be given to you by the pharmacist with each prescription and refill. Be sure to read this information carefully each time.   Talk to your pediatrician regarding the use of this medicine in children. While this drug may be prescribed for children as young as 7 years for selected conditions, precautions do apply.   Overdosage: If you think you have taken too much of this medicine contact a poison control center or emergency room at once. NOTE: This medicine is only for you. Do not share this medicine with others.   What if I miss a dose? If you miss a dose, skip the missed dose and go back to your regular dosing schedule. Do not take double or extra doses.   What may interact with this medicine? Do not take fluoxetine with any of the following  medications: -other medicines containing fluoxetine, like Sarafem or Symbyax -certain diet drugs like dexfenfluramine, fenfluramine, phentermine -cisapride -linezolid -medicines called MAO Inhibitors like Azilect, Carbex, Eldepryl, Marplan, Nardil, and Parnate -methylene blue -pimozide -procarbazine -thioridazine -tryptophan Fluoxetine may also interact with the following medications: -alcohol -any other medicines for depression, anxiety, or psychotic disturbances -aspirin and aspirin-like medicines -carbamazepine -cyproheptadine -dextromethorphan -flecainide -lithium -medicines for diabetes -medicines for migraine headache, like sumatriptan -medicines for sleep -medicines that treat or prevent blood clots like warfarin, enoxaparin, and dalteparin -metoprolol -NSAIDs, medicines for pain and inflammation, like ibuprofen or naproxen -phenytoin -propafenone -propranolol -St. John's wort -vinblastine This list may not describe all possible interactions. Give your health care provider a list of all the medicines, herbs, non-prescription drugs, or dietary supplements you use. Also tell them if you smoke, drink alcohol, or use illegal drugs. Some items may interact with your medicine.   What should I watch for while using this medicine? Visit your doctor or health care professional for regular checks on your progress. Continue to take your medicine even if you do not immediately feel better. It can take several weeks before you notice the full effect of this medicine.   Patients and their families should watch out for worsening depression or thoughts of suicide. Also watch out for any sudden or severe changes in feelings such as feeling anxious, agitated, panicky, irritable, hostile, aggressive, impulsive, severely restless, overly excited and hyperactive, or not being able to sleep. If this happens, especially at the beginning of treatment or after a change in dose, call your doctor.  You may get drowsy or dizzy. Do not drive, use machinery, or do anything that needs mental alertness until you know how this medicine affects you. Do not stand or sit up quickly, especially if you are an older patient. This reduces the risk of dizzy or fainting spells. Alcohol can make you more drowsy and dizzy. Avoid alcoholic drinks.   Your mouth may get dry. Chewing sugarless gum or sucking hard candy, and drinking plenty of water may help. Contact your doctor if the problem does not go away or is severe.   If you have diabetes, this medicine may affect blood sugar levels. Check your blood sugar. Talk to your doctor or health care professional if you notice changes.   If you have been taking this medicine regularly for some time, do not suddenly stop taking it. You must gradually reduce the dose or you may get side effects or have a worsening of your condition. Ask your doctor or health care professional for advice.   Do not treat yourself for coughs, colds or allergies without asking your doctor or health care professional for advice. Some ingredients can increase possible side effects.   What side effects may I notice from receiving this medicine? Side effects that you should report to your doctor or health care professional as soon as possible: -allergic reactions like skin rash, itching or hives, swelling of the face, lips, or tongue -breathing problems -confusion -fast or irregular heart rate, palpitations -flu-like fever, chills, cough, muscle or joint aches and pains -seizures -suicidal thoughts or other mood changes -tremors -trouble sleeping -unusual bleeding or bruising -unusually tired or weak -vomiting   Side effects that usually do not require medical attention (report to your doctor or health care professional if they continue or are bothersome): -blurred vision -change in sex drive or performance -diarrhea -dry mouth -flushing -headache -increased or decreased  appetite -nausea -sweating   This list may not describe all possible side effects. Call your doctor for medical advice about side effects. You may report side effects to FDA at 1-800-FDA-1088.   Where should I keep my medicine? Keep out of the reach of children.   Store at room temperature between 15 and 30 degrees C (59 and 86 degrees F). Throw away any unused medicine after the expiration date.   NOTE:This sheet is a summary. It may not cover all possible information. If you have questions about this medicine, talk to your doctor, pharmacist, or health care provider.      2011, Elsevier/Gold Standard.

## 2010-11-02 NOTE — Progress Notes (Signed)
35 yo Eric [name]. S/p Lapband surgery 09/05/2010.  Wt Readings from Last 3 Encounters:  11/02/10 231 lb 8 oz (105.008 kg)  07/21/10 248 lb 4 oz (112.605 kg)  07/14/10 245 lb 8 oz (111.358 kg)   Doing great.  Already feels better physically. Saw Dr. Johna Sheriff last week, told things are going as planned. No vomiting, epigastric pain or diarrhea.   Borderline DM- a1c was 6.3 in August 2011. Has been on Metformin 500 mg daily.  Anxiety- has had issues with anxiety for months. Gets anxious about things at work, finds himself snapping at his kids. Denies any symptoms of depression. Sleeping well. No SI or HI. Has never been on any medication for anxiety or depression. Denies any symptoms of mania.   Patient Active Problem List  Diagnoses  . DM  . VARICOSE VEINS, LOWER EXTREMITIES  . GERD  . GASTRIC ULCER  . SEBACEOUS CYST, NECK  . INSOMNIA  . CHEST PAIN  . CHEST WALL PAIN, ACUTE  . OBESITY, MORBID   Past Medical History  Diagnosis Date  . Overweight    Past Surgical History  Procedure Date  . Appendectomy 1988  . Esophagogastroduodenoscopy 05/26/2004    Dr. Arlyce Dice  . Laparoscopic gastric banding 08/2010    Current Outpatient Prescriptions on File Prior to Visit  Medication Sig Dispense Refill  . glucose blood (ONE TOUCH ULTRA TEST) test strip Check blood sugar as directed       . HYDROcodone-acetaminophen (VICODIN) 5-500 MG per tablet Take 1 tablet by mouth every 6 (six) hours as needed.        . metformin (FORTAMET) 500 MG (OSM) 24 hr tablet Take 500 mg by mouth daily with breakfast.        . ONE TOUCH LANCETS MISC Use as directed       . pantoprazole (PROTONIX) 40 MG tablet Take 40 mg by mouth daily.        Marland Kitchen zolpidem (AMBIEN) 10 MG tablet Take 1/2 to 1 tablet by mouth each night for sleep as needed.  To be used sparingly        The PMH, PSH, Social History, Family History, Medications, and allergies have been reviewed in [facility], and have been updated if  relevant.   Review of Systems       See HPI General:  Denies malaise. MS:  Complains of joint pain; denies joint redness and joint swelling.  Physical Exam BP 110/80  Pulse 76  Temp(Src) 98.6 F (37 C) (Oral)  Ht 5\' 6"  (1.676 m)  Wt 231 lb 8 oz (105.008 kg)  BMI 37.37 kg/m2  General:  alert, well-developed, well-nourished, well-hydrated, and overweight-appearing.  NAD Psych:  Cognition and judgment appear intact. Alert and cooperative with normal attention span and concentration. No apparent delusions, illusions, hallucinations   1. OBESITY, MORBID  Improving s/p lapband surgery!   2. DM  Wants to stop Metformin. Will recheck a1c today and likely stop Metformin. Orders Placed This Encounter  Procedures  . HgB A1c      3. Anxiety  New. Discussed treatment options. Not interested in psychotherapy at this time. Will start prozac 20 mg daily, follow [name] in 1 month.

## 2010-11-06 ENCOUNTER — Other Ambulatory Visit: Payer: Self-pay | Admitting: Family Medicine

## 2010-11-06 NOTE — Telephone Encounter (Signed)
Rx called to [facility]. 

## 2010-11-16 ENCOUNTER — Encounter (INDEPENDENT_AMBULATORY_CARE_PROVIDER_SITE_OTHER): Payer: BC Managed Care – PPO | Admitting: General Surgery

## 2010-11-30 ENCOUNTER — Ambulatory Visit (INDEPENDENT_AMBULATORY_CARE_PROVIDER_SITE_OTHER): Payer: BC Managed Care – PPO | Admitting: General Surgery

## 2010-11-30 ENCOUNTER — Encounter (INDEPENDENT_AMBULATORY_CARE_PROVIDER_SITE_OTHER): Payer: Self-pay | Admitting: General Surgery

## 2010-11-30 NOTE — Patient Instructions (Signed)
Continue to exercise regularly. Concentrated on small bites at eating slowly. Call as needed.

## 2010-11-30 NOTE — Progress Notes (Signed)
Patient returns for followup for lap band. Since his last fill 6 weeks ago he has lost an additional 5 pounds. He is now total weight loss of 20 pounds. He fills up more quickly than preop but still is eating well more than a cup of food at a time. He has no significant symptoms of over restriction.  On exam his abdomen is soft and nontender in the wounds and the port site looks fine.  With this information we elected to go ahead with a fill. We added one cc to bring his total to 5.5 cc. He was able to tolerate water well. We discussed diet and exercise strategies. He will return in 4-6 weeks.

## 2011-01-05 ENCOUNTER — Encounter (INDEPENDENT_AMBULATORY_CARE_PROVIDER_SITE_OTHER): Payer: BC Managed Care – PPO | Admitting: General Surgery

## 2011-01-09 ENCOUNTER — Other Ambulatory Visit: Payer: Self-pay | Admitting: Family Medicine

## 2011-01-19 ENCOUNTER — Other Ambulatory Visit: Payer: Self-pay | Admitting: Family Medicine

## 2011-01-30 MED ORDER — ZOLPIDEM TARTRATE 10 MG PO TABS: ORAL_TABLET | ORAL | Status: DC

## 2011-01-30 NOTE — Telephone Encounter (Signed)
Pt called about ambien  refill, surescripts sent this on 9/07,  he is asking that this be sent to [facility] [address].  ( with sure script request it looks like script is to be printed, rather than called in, and I cant change that from my end).

## 2011-01-30 NOTE — Telephone Encounter (Signed)
Please phone in rx to [facility] as entered below.

## 2011-01-30 NOTE — Telephone Encounter (Signed)
Rx called to [facility].  Left message on cell phone voicemail advising patient.

## 2011-02-09 ENCOUNTER — Ambulatory Visit (INDEPENDENT_AMBULATORY_CARE_PROVIDER_SITE_OTHER): Payer: BC Managed Care – PPO | Admitting: Physician Assistant

## 2011-02-09 ENCOUNTER — Encounter (INDEPENDENT_AMBULATORY_CARE_PROVIDER_SITE_OTHER): Payer: Self-pay | Admitting: Physician Assistant

## 2011-02-09 VITALS — BP 118/76 | HR 64 | Temp 96.9°F | Resp 16 | Ht 65.5 in | Wt 217.8 lb

## 2011-02-09 DIAGNOSIS — Z4651 Encounter for fitting and adjustment of gastric lap band: Secondary | ICD-10-CM

## 2011-02-09 NOTE — Patient Instructions (Signed)
Take clear liquids for the next 48 hours. Thin protein shakes are ok to start on Saturday evening. Call us if you have persistent vomiting or regurgitation, night cough or reflux symptoms. Return as scheduled or sooner if you notice no changes in hunger/portion sizes.   

## 2011-02-09 NOTE — Progress Notes (Signed)
  HISTORY: Eric [name] is a 35 y.o.male who received an AP-Large lap-band in April 2012 by Dr. Johna Sheriff. He's been doing well since his last appointment. He denies persistent regurgitation symptoms but says his hunger has increased as have his portion sizes.  VITAL SIGNS: Filed Vitals:   02/09/11 0944  BP: 118/76  Pulse: 64  Temp: 96.9 F (36.1 C)  Resp: 16    PHYSICAL EXAM: Physical exam reveals a very well-appearing 35 y.o.male in no apparent distress Neurologic: Awake, alert, oriented Psych: Bright affect, conversant Respiratory: Breathing even and unlabored. No stridor or wheezing Abdomen: Soft, nontender, nondistended to palpation. Incisions well-healed. No incisional hernias. Port easily palpated. Extremities: Atraumatic, good range of motion.  ASSESMENT: 35 y.o.  male  s/p AP-Large lap-band.   PLAN: The patient's port was accessed with a 20G Huber needle without difficulty. Clear fluid was aspirated and 0.5 mL saline was added to the port to give a total predicted volume of 6.0 mL. The patient was able to swallow water without difficulty following the procedure and was instructed to take clear liquids for the next 24-48 hours and advance slowly as tolerated.

## 2011-02-19 ENCOUNTER — Encounter: Payer: Self-pay | Admitting: Family Medicine

## 2011-02-19 ENCOUNTER — Ambulatory Visit (INDEPENDENT_AMBULATORY_CARE_PROVIDER_SITE_OTHER): Payer: BC Managed Care – PPO | Admitting: Family Medicine

## 2011-02-19 VITALS — BP 110/70 | HR 80 | Temp 98.5°F | Ht 65.5 in | Wt 225.0 lb

## 2011-02-19 DIAGNOSIS — E119 Type 2 diabetes mellitus without complications: Secondary | ICD-10-CM

## 2011-02-19 DIAGNOSIS — F419 Anxiety disorder, unspecified: Secondary | ICD-10-CM

## 2011-02-19 DIAGNOSIS — F411 Generalized anxiety disorder: Secondary | ICD-10-CM

## 2011-02-19 LAB — HEMOGLOBIN A1C: Hgb A1c MFr Bld: 6.6 % — ABNORMAL HIGH (ref 4.6–6.5)

## 2011-02-19 MED ORDER — PANTOPRAZOLE SODIUM 40 MG PO TBEC: 40.0000 mg | DELAYED_RELEASE_TABLET | Freq: Every day | ORAL | Status: DC

## 2011-02-19 MED ORDER — FLUOXETINE HCL 20 MG PO CAPS: 20.0000 mg | ORAL_CAPSULE | Freq: Every day | ORAL | Status: DC

## 2011-02-19 NOTE — Patient Instructions (Signed)
Great to see you, Eric [name]! You look great! Say hi to your lovely family for me.  I will call you with your lab results in a day or two.  We can probably stop your Metformin very soon.

## 2011-02-19 NOTE — Progress Notes (Signed)
35 yo here for follow up. S/p Lapband surgery 09/05/2010.   Doing great.  He is thrilled with his results and wishes he had done it sooner.   No vomiting, epigastric pain or diarrhea. Weighs 210 now without clothes on!  Wt Readings from Last 3 Encounters:  02/19/11 225 lb (102.059 kg)  02/09/11 217 lb 12.8 oz (98.793 kg)  11/02/10 231 lb 8 oz (105.008 kg)      DM- a1c was 6.3 in August 2011 and 6.9 in June 2012. Lab Results  Component Value Date   HGBA1C 6.9* 11/02/2010  Has been on Metformin 500 mg daily. Had one episode of hypoglycemia- CBG 50- became diaphoretic, nauseated. Never had an episode like that- had a piece of candy and felt much better.  Anxiety- Gets anxious about things at work, finds himself snapping at his kids. Denies any symptoms of depression. Sleeping well. No SI or HI. Prozac 20 mg daily started a few months ago. Feels much better!  More "chill." No noticeable adverse side effects.   Patient Active Problem List  Diagnoses  . DM  . VARICOSE VEINS, LOWER EXTREMITIES  . GERD  . GASTRIC ULCER  . SEBACEOUS CYST, NECK  . INSOMNIA  . CHEST PAIN  . CHEST WALL PAIN, ACUTE  . OBESITY, MORBID  . Anxiety   Past Medical History  Diagnosis Date  . Overweight    Past Surgical History  Procedure Date  . Appendectomy 1988  . Esophagogastroduodenoscopy 05/26/2004    Dr. Arlyce Dice  . Laparoscopic gastric banding 08/2010    Current Outpatient Prescriptions on File Prior to Visit  Medication Sig Dispense Refill  . FLUoxetine (PROZAC) 20 MG capsule Take 1 capsule (20 mg total) by mouth daily.  30 capsule  2  . glucose blood (ONE TOUCH ULTRA TEST) test strip Check blood sugar as directed       . HYDROcodone-acetaminophen (VICODIN) 5-500 MG per tablet Take 1 tablet by mouth every 6 (six) hours as needed.        . metformin (FORTAMET) 500 MG (OSM) 24 hr tablet Take 500 mg by mouth daily with breakfast.        . ONE TOUCH LANCETS MISC Use as directed       .  pantoprazole (PROTONIX) 40 MG tablet Take 40 mg by mouth daily.        Marland Kitchen zolpidem (AMBIEN) 10 MG tablet TAKE ONE-HALF TO ONE TABLET BY MOUTH AT BEDTIME AS NEEDED FOR SLEEP  30 tablet  0   The PMH, PSH, Social History, Family History, Medications, and allergies have been reviewed in [facility], and have been updated if relevant.   Review of Systems       See HPI   Physical Exam BP 110/70  Pulse 80  Temp(Src) 98.5 F (36.9 C) (Oral)  Ht 5' 5.5" (1.664 m)  Wt 225 lb (102.059 kg)  BMI 36.87 kg/m2  General:  overweght male in NAD Eyes:  PERRL Ears:  External ear exam shows no significant lesions or deformities.  Otoscopic examination reveals clear canals, tympanic membranes are intact bilaterally without bulging, retraction, inflammation or discharge. Hearing is grossly normal bilaterally. Nose:  External nasal examination shows no deformity or inflammation. Nasal mucosa are pink and moist without lesions or exudates. Mouth:  Oral mucosa and oropharynx without lesions or exudates.  Teeth in good repair. Neck:  no carotid bruit or thyromegaly no cervical or supraclavicular lymphadenopathy  Lungs:  Normal respiratory effort, chest expands symmetrically. Lungs are clear  to auscultation, no crackles or wheezes. Heart:  Normal rate and regular rhythm. S1 and S2 normal without gallop, murmur, click, rub or other extra sounds. Pulses:  R and L posterior tibial pulses are full and equal bilaterally  Extremities:  no edema     1. OBESITY, MORBID  Improving s/p lapband surgery!   2. DM  Improved with weight loss. Wants to stop Metformin. Will recheck a1c today and likely stop Metformin.     3. Anxiety  Improved. Continue prozac 20 mg daily, follow up in 1 month.

## 2011-03-09 ENCOUNTER — Encounter (INDEPENDENT_AMBULATORY_CARE_PROVIDER_SITE_OTHER): Payer: BC Managed Care – PPO

## 2011-03-16 ENCOUNTER — Encounter (INDEPENDENT_AMBULATORY_CARE_PROVIDER_SITE_OTHER): Payer: Self-pay

## 2011-03-16 ENCOUNTER — Ambulatory Visit (INDEPENDENT_AMBULATORY_CARE_PROVIDER_SITE_OTHER): Payer: BC Managed Care – PPO | Admitting: Physician Assistant

## 2011-03-16 VITALS — BP 128/88 | HR 64 | Temp 96.8°F | Resp 20 | Ht 65.5 in | Wt 213.2 lb

## 2011-03-16 DIAGNOSIS — Z4651 Encounter for fitting and adjustment of gastric lap band: Secondary | ICD-10-CM

## 2011-03-16 DIAGNOSIS — L723 Sebaceous cyst: Secondary | ICD-10-CM

## 2011-03-16 NOTE — Progress Notes (Signed)
  HISTORY: Eric [name] is a 35 y.o.male who received an AP-Large lap-band in April 2012 by Dr. Johna Sheriff. He's lost weight since his last visit a month ago but is noticing increasing hunger and portion sizes. He denies persistent vomiting or regurgitation, and this happens only if he eats too quickly or doesn't chew adequately. He also complains of a cyst to the back of his scalp that's recurrent. No fevers. Occasionally it exudes an oily material.  VITAL SIGNS: Filed Vitals:   03/16/11 0904  BP: 128/88  Pulse: 64  Temp: 96.8 F (36 C)  Resp: 20    PHYSICAL EXAM: Physical exam reveals a very well-appearing 35 y.o.male in no apparent distress Neurologic: Awake, alert, oriented Psych: Bright affect, conversant Respiratory: Breathing even and unlabored. No stridor or wheezing Abdomen: Soft, nontender, nondistended to palpation. Incisions well-healed. No incisional hernias. Port easily palpated. Extremities: Atraumatic, good range of motion.  ASSESMENT: 35 y.o.  male  s/p AP-Large lap-band. Sebaceous cyst.  PLAN: The patient's port was accessed with a 20G Huber needle without difficulty. Clear fluid was aspirated and 0.5 mL saline was added to the port to give a total predicted volume of 6.5 mL. The patient was able to swallow water without difficulty following the procedure and was instructed to take clear liquids for the next 24-48 hours and advance slowly as tolerated. He'll be set up to see Dr. Johna Sheriff to address the sebaceous cyst.

## 2011-03-16 NOTE — Patient Instructions (Signed)
Take clear liquids for the next 48 hours. Thin protein shakes are ok to start on Saturday evening. Call us if you have persistent vomiting or regurgitation, night cough or reflux symptoms. Return as scheduled or sooner if you notice no changes in hunger/portion sizes. Follow-up with Dr. Johna Sheriff regarding your sebaceous cyst.

## 2011-03-22 ENCOUNTER — Other Ambulatory Visit: Payer: Self-pay | Admitting: Family Medicine

## 2011-03-22 ENCOUNTER — Ambulatory Visit: Payer: BC Managed Care – PPO | Admitting: Family Medicine

## 2011-03-22 NOTE — Telephone Encounter (Signed)
E-script request denied, Rx was called in to pharmacy.

## 2011-03-22 NOTE — Telephone Encounter (Signed)
Rx called to [facility]. 

## 2011-04-13 ENCOUNTER — Ambulatory Visit (INDEPENDENT_AMBULATORY_CARE_PROVIDER_SITE_OTHER): Payer: BC Managed Care – PPO | Admitting: Physician Assistant

## 2011-04-13 ENCOUNTER — Encounter (INDEPENDENT_AMBULATORY_CARE_PROVIDER_SITE_OTHER): Payer: Self-pay

## 2011-04-13 VITALS — BP 110/82 | HR 77 | Temp 97.2°F | Ht 65.0 in | Wt 207.6 lb

## 2011-04-13 DIAGNOSIS — Z4651 Encounter for fitting and adjustment of gastric lap band: Secondary | ICD-10-CM

## 2011-04-13 NOTE — Patient Instructions (Signed)
Take clear liquids for the next 48 hours. Thin protein shakes are ok to start on Saturday evening. Call us if you have persistent vomiting or regurgitation, night cough or reflux symptoms. Return as scheduled or sooner if you notice no changes in hunger/portion sizes.   

## 2011-04-13 NOTE — Progress Notes (Signed)
  HISTORY: Eric [name] is a 35 y.o.male who received an AP-Large lap-band in April 2012 by Dr. Johna Sheriff. He has no new complaints but has noticed hunger has increased. No untoward symptoms including persistent regurgitation.  VITAL SIGNS: Filed Vitals:   04/13/11 1039  BP: 110/82  Pulse: 77  Temp: 97.2 F (36.2 C)    PHYSICAL EXAM: Physical exam reveals a very well-appearing 35 y.o.male in no apparent distress Neurologic: Awake, alert, oriented Psych: Bright affect, conversant Respiratory: Breathing even and unlabored. No stridor or wheezing Abdomen: Soft, nontender, nondistended to palpation. Incisions well-healed. No incisional hernias. Port easily palpated. Extremities: Atraumatic, good range of motion.  ASSESMENT: 35 y.o.  male  s/p AP-Large lap-band.   PLAN: The patient's port was accessed with a 20G Huber needle without difficulty. Clear fluid was aspirated and 0.5 mL saline was added to the port to give a total predicted volume of 7 mL. The patient was able to swallow water without difficulty following the procedure and was instructed to take clear liquids for the next 24-48 hours and advance slowly as tolerated.

## 2011-04-19 ENCOUNTER — Encounter (INDEPENDENT_AMBULATORY_CARE_PROVIDER_SITE_OTHER): Payer: BC Managed Care – PPO | Admitting: General Surgery

## 2011-05-04 ENCOUNTER — Other Ambulatory Visit: Payer: Self-pay | Admitting: Family Medicine

## 2011-05-04 NOTE — Telephone Encounter (Signed)
Rx called to [facility] pharmacy

## 2011-05-25 ENCOUNTER — Encounter (INDEPENDENT_AMBULATORY_CARE_PROVIDER_SITE_OTHER): Payer: BC Managed Care – PPO

## 2011-06-11 ENCOUNTER — Other Ambulatory Visit: Payer: Self-pay | Admitting: Family Medicine

## 2011-06-12 ENCOUNTER — Other Ambulatory Visit: Payer: Self-pay | Admitting: Family Medicine

## 2011-06-12 NOTE — Telephone Encounter (Signed)
Rx called to pharmacy as instructed. 

## 2011-06-12 NOTE — Telephone Encounter (Signed)
Received refill request electronically from pharmacy. Is it okay to refill medication? 

## 2011-06-13 NOTE — Telephone Encounter (Signed)
I think I already approved this request. If not, ok to refill.

## 2011-06-13 NOTE — Telephone Encounter (Signed)
Received refill request electronically from pharmacy. Is it okay to refill medication? 

## 2011-07-26 ENCOUNTER — Ambulatory Visit (INDEPENDENT_AMBULATORY_CARE_PROVIDER_SITE_OTHER): Payer: BC Managed Care – PPO | Admitting: Physician Assistant

## 2011-07-26 ENCOUNTER — Encounter (INDEPENDENT_AMBULATORY_CARE_PROVIDER_SITE_OTHER): Payer: Self-pay

## 2011-07-26 VITALS — BP 122/86 | HR 80 | Temp 97.4°F | Resp 16 | Ht 65.5 in | Wt 213.2 lb

## 2011-07-26 DIAGNOSIS — Z4651 Encounter for fitting and adjustment of gastric lap band: Secondary | ICD-10-CM

## 2011-07-26 NOTE — Patient Instructions (Signed)
Take clear liquids tonight. Thin protein shakes are ok to start tomorrow morning. Slowly advance your diet thereafter. Call us if you have persistent vomiting or regurgitation, night cough or reflux symptoms. Return as scheduled or sooner if you notice no changes in hunger/portion sizes.  

## 2011-07-26 NOTE — Progress Notes (Signed)
  HISTORY: Eric [name] is a 36 y.o.male who received an AP-Large lap-band in April 2012 by Dr. Johna Sheriff. He comes in today with progressively increasing hunger and portion sizes since his last visit four months ago. He's gained 5.6 lbs. He denies persistent regurgitation or vomiting. He thinks an adjustment is needed.  VITAL SIGNS: Filed Vitals:   07/26/11 0950  BP: 122/86  Pulse: 80  Temp: 97.4 F (36.3 C)  Resp: 16    PHYSICAL EXAM: Physical exam reveals a very well-appearing 35 y.o.male in no apparent distress Neurologic: Awake, alert, oriented Psych: Bright affect, conversant Respiratory: Breathing even and unlabored. No stridor or wheezing Abdomen: Soft, nontender, nondistended to palpation. Incisions well-healed. No incisional hernias. Port easily palpated. Extremities: Atraumatic, good range of motion.  ASSESMENT: 36 y.o.  male  s/p AP-Large lap-band.   PLAN: The patient's port was accessed with a 20G Huber needle without difficulty. Clear fluid was aspirated and 0.5 mL saline was added to the port to give a total predicted volume of 7.5 mL. The patient was able to swallow water without difficulty following the procedure and was instructed to take clear liquids for the next 24-48 hours and advance slowly as tolerated.

## 2011-08-14 ENCOUNTER — Other Ambulatory Visit: Payer: Self-pay | Admitting: Family Medicine

## 2011-08-15 ENCOUNTER — Other Ambulatory Visit: Payer: Self-pay | Admitting: Family Medicine

## 2011-08-15 NOTE — Telephone Encounter (Signed)
Medicine called to pharmacy. 

## 2011-09-06 ENCOUNTER — Ambulatory Visit (INDEPENDENT_AMBULATORY_CARE_PROVIDER_SITE_OTHER): Payer: BC Managed Care – PPO | Admitting: Physician Assistant

## 2011-09-06 ENCOUNTER — Encounter (INDEPENDENT_AMBULATORY_CARE_PROVIDER_SITE_OTHER): Payer: Self-pay

## 2011-09-06 VITALS — BP 116/82 | Ht 65.5 in | Wt 212.4 lb

## 2011-09-06 DIAGNOSIS — Z4651 Encounter for fitting and adjustment of gastric lap band: Secondary | ICD-10-CM

## 2011-09-06 NOTE — Progress Notes (Signed)
  HISTORY: Eric [name] is a 36 y.o.male who received an AP-Large lap-band in April 2012 by Dr. Johna Sheriff. He comes in today with no new complaints. No untoward symptoms. He says he's engaged in an exercise program in the past three weeks. He still thinks he eats more than he should.  VITAL SIGNS: Filed Vitals:   09/06/11 0938  BP: 116/82    PHYSICAL EXAM: Physical exam reveals a very well-appearing 35 y.o.male in no apparent distress Neurologic: Awake, alert, oriented Psych: Bright affect, conversant Respiratory: Breathing even and unlabored. No stridor or wheezing Abdomen: Soft, nontender, nondistended to palpation. Incisions well-healed. No incisional hernias. Port easily palpated. Extremities: Atraumatic, good range of motion.  ASSESMENT: 36 y.o.  male  s/p AP-Large lap-band.   PLAN: The patient's port was accessed with a 20G Huber needle without difficulty. Clear fluid was aspirated and 0.5 mL saline was added to the port to give a total predicted volume of 8 mL. The patient was able to swallow water without difficulty following the procedure and was instructed to take clear liquids for the next 24-48 hours and advance slowly as tolerated.

## 2011-09-06 NOTE — Patient Instructions (Signed)
Take clear liquids tonight. Thin protein shakes are ok to start tomorrow morning. Slowly advance your diet thereafter. Call us if you have persistent vomiting or regurgitation, night cough or reflux symptoms. Return as scheduled or sooner if you notice no changes in hunger/portion sizes.  

## 2011-09-30 ENCOUNTER — Encounter (HOSPITAL_COMMUNITY): Payer: Self-pay | Admitting: Emergency Medicine

## 2011-09-30 ENCOUNTER — Emergency Department (HOSPITAL_COMMUNITY): Payer: BC Managed Care – PPO

## 2011-09-30 ENCOUNTER — Emergency Department (HOSPITAL_COMMUNITY)
Admission: EM | Admit: 2011-09-30 | Discharge: 2011-09-30 | Disposition: A | Discharge status: Home / Self Care | Payer: BC Managed Care – PPO | Attending: Emergency Medicine | Admitting: Emergency Medicine

## 2011-09-30 DIAGNOSIS — R079 Chest pain, unspecified: Secondary | ICD-10-CM | POA: Insufficient documentation

## 2011-09-30 DIAGNOSIS — R0789 Other chest pain: Secondary | ICD-10-CM | POA: Insufficient documentation

## 2011-09-30 HISTORY — DX: Major depressive disorder, single episode, unspecified: F32.9

## 2011-09-30 HISTORY — DX: Depression, unspecified: F32.A

## 2011-09-30 LAB — CBC
HCT: 40.7 % (ref 39.0–52.0)
Hemoglobin: 14.8 g/dL (ref 13.0–17.0)
MCHC: 36.4 g/dL — ABNORMAL HIGH (ref 30.0–36.0)
MCV: 86.2 fL (ref 78.0–100.0)
RDW: 12.1 % (ref 11.5–15.5)
WBC: 7.2 10*3/uL (ref 4.0–10.5)

## 2011-09-30 LAB — POCT I-STAT TROPONIN I: Troponin i, poc: 0 ng/mL (ref 0.00–0.08)

## 2011-09-30 LAB — POCT I-STAT, CHEM 8
BUN: 17 mg/dL (ref 6–23)
Calcium, Ion: 1.19 mmol/L (ref 1.12–1.32)
Chloride: 104 mEq/L (ref 96–112)
Glucose, Bld: 97 mg/dL (ref 70–99)
TCO2: 26 mmol/L (ref 0–100)

## 2011-09-30 LAB — BASIC METABOLIC PANEL
BUN: 17 mg/dL (ref 6–23)
Chloride: 102 mEq/L (ref 96–112)
Creatinine, Ser: 1 mg/dL (ref 0.50–1.35)
GFR calc Af Amer: >90 mL/min (ref >90)
Glucose, Bld: 95 mg/dL (ref 70–99)

## 2011-09-30 MED ORDER — HYDROCODONE-ACETAMINOPHEN 5-325 MG PO TABS
2.0000 | ORAL_TABLET | Freq: Once | ORAL | Status: AC
  Administered 2011-09-30: 2 via ORAL
  Filled 2011-09-30: qty 2

## 2011-09-30 MED ORDER — HYDROCODONE-ACETAMINOPHEN 5-325 MG PO TABS: 2.0000 | ORAL_TABLET | ORAL | Status: AC | PRN

## 2011-09-30 NOTE — Discharge Instructions (Signed)
Chest Pain (Nonspecific) The [place] cardiology office will call you tomorrow to schedule an office visit for this week. If you don't hear from the office by tomorrow afternoon call to schedule the appointment at [phone]. Take Tylenol for mild pain or the pain medicine prescribed for bad pain It is often hard to give a specific diagnosis for the cause of chest pain. There is always a chance that your pain could be related to something serious, such as a heart attack or a blood clot in the lungs. You need to follow up with your caregiver for further evaluation. CAUSES   Heartburn.   Pneumonia or bronchitis.   Anxiety or stress.   Inflammation around your heart (pericarditis) or lung (pleuritis or pleurisy).   A blood clot in the lung.   A collapsed lung (pneumothorax). It can develop suddenly on its own (spontaneous pneumothorax) or from injury (trauma) to the chest.   Shingles infection (herpes zoster virus).  The chest wall is composed of bones, muscles, and cartilage. Any of these can be the source of the pain.  The bones can be bruised by injury.   The muscles or cartilage can be strained by coughing or overwork.   The cartilage can be affected by inflammation and become sore (costochondritis).  DIAGNOSIS  Lab tests or other studies, such as X-rays, electrocardiography, stress testing, or cardiac imaging, may be needed to find the cause of your pain.  TREATMENT   Treatment depends on what may be causing your chest pain. Treatment may include:   Acid blockers for heartburn.   Anti-inflammatory medicine.   Pain medicine for inflammatory conditions.   Antibiotics if an infection is present.   You may be advised to change lifestyle habits. This includes stopping smoking and avoiding alcohol, caffeine, and chocolate.   You may be advised to keep your head raised (elevated) when sleeping. This reduces the chance of acid going backward from your stomach into your esophagus.    Most of the time, nonspecific chest pain will improve within 2 to 3 days with rest and mild pain medicine.  HOME CARE INSTRUCTIONS   If antibiotics were prescribed, take your antibiotics as directed. Finish them even if you start to feel better.   For the next few days, avoid physical activities that bring on chest pain. Continue physical activities as directed.   Do not smoke.   Avoid drinking alcohol.   Only take over-the-counter or prescription medicine for pain, discomfort, or fever as directed by your caregiver.   Follow your caregiver's suggestions for further testing if your chest pain does not go away.   Keep any follow-up appointments you made. If you do not go to an appointment, you could develop lasting (chronic) problems with pain. If there is any problem keeping an appointment, you must call to reschedule.  SEEK MEDICAL CARE IF:   You think you are having problems from the medicine you are taking. Read your medicine instructions carefully.   Your chest pain does not go away, even after treatment.   You develop a rash with blisters on your chest.  SEEK IMMEDIATE MEDICAL CARE IF:   You have increased chest pain or pain that spreads to your arm, neck, jaw, back, or abdomen.   You develop shortness of breath, an increasing cough, or you are coughing up blood.   You have severe back or abdominal pain, feel nauseous, or vomit.   You develop severe weakness, fainting, or chills.   You have a  fever.  THIS IS AN EMERGENCY. Do not wait to see if the pain will go away. Get medical help at once. Call your local emergency services (911 in [place] Do not drive yourself to the hospital. MAKE SURE YOU:   Understand these instructions.   Will watch your condition.   Will get help right away if you are not doing well or get worse.  Document Released: 02/07/2005 Document Revised: 04/19/2011 Document Reviewed: 12/04/2007 [facility] Patient Information 2012 [place].

## 2011-09-30 NOTE — ED Notes (Signed)
C/o intermittent L sided chest pain since Wednesday that has been constant since last night.  Mild sob.  Denies nausea and vomiting.

## 2011-09-30 NOTE — ED Notes (Signed)
Pt states that he has been having CP intermittantly for the past couple of days. Pt states pain was worse last night, pt denies SOB, N/V. Pt states one episode of diaporhesis last night with pain. Pt states pain is center left chest and does not radiate anywhere. Pt alert and oriented. Pt able to move all extremities.

## 2011-09-30 NOTE — ED Provider Notes (Signed)
History     CSN: [id number]  Arrival date & time 09/30/11  1916   First MD Initiated Contact with Patient 09/30/11 1957      Chief Complaint  Patient presents with  . Chest Pain    (Consider location/radiation/quality/duration/timing/severity/associated sxs/prior treatment) HPI Complains of left anterior chest pain nonradiating sharp in quality onset 4 days ago constant no associated nausea no shortness of breath no sweatiness symptoms not made better or worse by anything symptoms are nonexertional. Constant treated with Advil 4 days ago, without relief. Pain is moderate at present he denies other complaint. No other associated symptoms. Chest pain covers approximately 5 cm area of left antero- lateral chest   Past Medical History  Diagnosis Date  . Overweight   . Depression     Past Surgical History  Procedure Date  . Appendectomy 1988  . Esophagogastroduodenoscopy 05/26/2004    Dr. Arlyce Dice  . Laparoscopic gastric banding 08/2010    Family History  Problem Relation Age of Onset  . Heart disease Father     History  Substance Use Topics  . Smoking status: Never Smoker   . Smokeless tobacco: Never Used  . Alcohol Use: Yes      Review of Systems  Constitutional: Negative.   HENT: Negative.   Respiratory: Negative.   Cardiovascular: Positive for chest pain.  Gastrointestinal: Negative.   Musculoskeletal: Negative.   Skin: Negative.   Neurological: Negative.   Hematological: Negative.   Psychiatric/Behavioral: Negative.   All other systems reviewed and are negative.    Allergies  Review of patient's allergies indicates no known allergies.  Home Medications   Current Outpatient Rx  Name Route Sig Dispense Refill  . PROZAC PO Oral Take 1 tablet by mouth daily.    Marland Kitchen PANTOPRAZOLE SODIUM 40 MG PO TBEC Oral Take 1 tablet (40 mg total) by mouth daily. 30 tablet 12  . ZOLPIDEM TARTRATE 10 MG PO TABS Oral Take 10 mg by mouth at bedtime as needed. For sleep    .  GLUCOSE BLOOD VI STRP  Check blood sugar as directed       BP 103/68  Pulse 66  Temp(Src) 98.2 F (36.8 C) (Oral)  Resp 18  SpO2 97%  Physical Exam  Nursing note and vitals reviewed. Constitutional: He appears well-developed and well-nourished.  HENT:  Head: Normocephalic and atraumatic.  Eyes: Conjunctivae are normal. Pupils are equal, round, and reactive to light.  Neck: Neck supple. No tracheal deviation present. No thyromegaly present.  Cardiovascular: Normal rate and regular rhythm.   No murmur heard. Pulmonary/Chest: Effort normal and breath sounds normal.  Abdominal: Soft. Bowel sounds are normal. He exhibits no distension. There is tenderness.       Left chest mildly tender at anterolateral aspect  Musculoskeletal: Normal range of motion. He exhibits no edema and no tenderness.  Neurological: He is alert. Coordination normal.  Skin: Skin is warm and dry. No rash noted.  Psychiatric: He has a normal mood and affect.    Date: 09/30/2011  Rate: 70  Rhythm: normal sinus rhythm  QRS Axis: normal  Intervals: normal  ST/T Wave abnormalities: normal  Conduction Disutrbances: none  Narrative Interpretation: unremarkable Unchanged from 06/28/2010   ED Course  Procedures (including critical care time) 10:15 PM pain improved after treatment with  Norco  Labs Reviewed  CBC  BASIC METABOLIC PANEL   No results found.   No diagnosis found.  Results for orders placed during the hospital encounter of 09/30/11  CBC  Component Value Range   WBC 7.2  4.0 - 10.5 (K/uL)   RBC 4.72  4.22 - 5.81 (MIL/uL)   Hemoglobin 14.8  13.0 - 17.0 (g/dL)   HCT 16.1  09.6 - 04.5 (%)   MCV 86.2  78.0 - 100.0 (fL)   MCH 31.4  26.0 - 34.0 (pg)   MCHC 36.4 (*) 30.0 - 36.0 (g/dL)   RDW 40.9  81.1 - 91.4 (%)   Platelets 254  150 - 400 (K/uL)  BASIC METABOLIC PANEL      Component Value Range   Sodium 137  135 - 145 (mEq/L)   Potassium 4.1  3.5 - 5.1 (mEq/L)   Chloride 102  96 - 112  (mEq/L)   CO2 24  19 - 32 (mEq/L)   Glucose, Bld 95  70 - 99 (mg/dL)   BUN 17  6 - 23 (mg/dL)   Creatinine, Ser 7.82  0.50 - 1.35 (mg/dL)   Calcium 9.2  8.4 - 95.6 (mg/dL)   GFR calc non Af Amer >90  >90 (mL/min)   GFR calc Af Amer >90  >90 (mL/min)  POCT I-STAT, CHEM 8      Component Value Range   Sodium 141  135 - 145 (mEq/L)   Potassium 3.7  3.5 - 5.1 (mEq/L)   Chloride 104  96 - 112 (mEq/L)   BUN 17  6 - 23 (mg/dL)   Creatinine, Ser 2.13  0.50 - 1.35 (mg/dL)   Glucose, Bld 97  70 - 99 (mg/dL)   Calcium, Ion 0.86  5.78 - 1.32 (mmol/L)   TCO2 26  0 - 100 (mmol/L)   Hemoglobin 14.3  13.0 - 17.0 (g/dL)   HCT 46.9  62.9 - 52.8 (%)  POCT I-STAT TROPONIN I      Component Value Range   Troponin i, poc 0.00  0.00 - 0.08 (ng/mL)   Comment 3            Dg Chest 2 View  09/30/2011  *RADIOLOGY REPORT*  Clinical Data: Left-sided pain  CHEST - 2 VIEW  Comparison: Chest radiograph 06/28/2010  Findings: Gastric banding device noted.  Normal cardiac silhouette. No effusion, infiltrate, or pneumothorax.  IMPRESSION: No acute cardiopulmonary process.  Original Report Authenticated By: Genevive Bi, M.D.     MDM  Strongly doubt acute coronary syndrome i.e. normal EKG pain nonexertional constant chest pain four days normal troponin atypical symptoms. Doubt pulmonary embolism i.e. no shortness of breath atypical symptoms doubt aortic dissection or aneurysm noted nonacute EKG atypical symptoms Spoke with Dr. Norris Cross Plan prescription hydrocodone-A. [facility] to call patient tomorrow to schedule outpatient visit Diagnoses atypical chest pain        Doug Sou, MD 09/30/11 2221

## 2011-10-03 ENCOUNTER — Ambulatory Visit (INDEPENDENT_AMBULATORY_CARE_PROVIDER_SITE_OTHER): Payer: BC Managed Care – PPO | Admitting: Family Medicine

## 2011-10-03 ENCOUNTER — Encounter: Payer: Self-pay | Admitting: Family Medicine

## 2011-10-03 VITALS — BP 110/78 | HR 68 | Temp 98.2°F | Wt 202.0 lb

## 2011-10-03 DIAGNOSIS — F419 Anxiety disorder, unspecified: Secondary | ICD-10-CM

## 2011-10-03 DIAGNOSIS — F411 Generalized anxiety disorder: Secondary | ICD-10-CM

## 2011-10-03 DIAGNOSIS — R0789 Other chest pain: Secondary | ICD-10-CM

## 2011-10-03 MED ORDER — FLUOXETINE HCL 20 MG PO CAPS: 40.0000 mg | ORAL_CAPSULE | Freq: Every day | ORAL | Status: DC

## 2011-10-03 NOTE — Progress Notes (Signed)
Subjective:    Patient ID: Eric [name], male    DOB: [birth year], 36 y.o.   MRN: [id number]  HPI  36 yo here for ER follow up.  Note reviewed.   Went to [facility] on 5/19 with 4 days of left anterior chest pain.   Pain actually woke him up from his sleep the night of 19th, so his wife urged him to go to the ER. Never had anything like this before. PPI did not help with symptoms.  Not having any issues with his lap band.  Pt is still having pain although improved.  Pain is not reproducible with palpation.  Pain does not radiate and denies diaphoresis, although he did feel "hot."  Pain not worsened by exertion or relieved by rest.  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 He has also had some increased anxiety at work prior to his chief's passing. On Prozac 20 mg daily.  Sleeping and eating ok.  Denies any SI or HI. Father died of ? MI at 26.  Lab Results  Component Value Date   CHOL 154 08/24/2005   HDL 44 08/24/2005   LDLCALC 100 08/24/2005      Results for orders placed during the hospital encounter of 09/30/11  CBC      Component Value Range   WBC 7.2  4.0 - 10.5 (K/uL)   RBC 4.72  4.22 - 5.81 (MIL/uL)   Hemoglobin 14.8  13.0 - 17.0 (g/dL)   HCT 40.9  81.1 - 91.4 (%)   MCV 86.2  78.0 - 100.0 (fL)   MCH 31.4  26.0 - 34.0 (pg)   MCHC 36.4 (*) 30.0 - 36.0 (g/dL)   RDW 78.2  95.6 - 21.3 (%)   Platelets 254  150 - 400 (K/uL)  BASIC METABOLIC PANEL      Component Value Range   Sodium 137  135 - 145 (mEq/L)   Potassium 4.1  3.5 - 5.1 (mEq/L)   Chloride 102  96 - 112 (mEq/L)   CO2 24  19 - 32 (mEq/L)   Glucose, Bld 95  70 - 99 (mg/dL)   BUN 17  6 - 23 (mg/dL)   Creatinine, Ser 0.86  0.50 - 1.35 (mg/dL)   Calcium 9.2  8.4 - 57.8 (mg/dL)   GFR calc non Af Amer >90  >90 (mL/min)   GFR calc Af Amer >90  >90 (mL/min)  POCT I-STAT, CHEM 8      Component Value Range   Sodium 141  135 - 145 (mEq/L)   Potassium 3.7  3.5 - 5.1 (mEq/L)   Chloride 104  96 - 112 (mEq/L)   BUN 17  6 - 23 (mg/dL)   Creatinine, Ser 4.69  0.50 - 1.35 (mg/dL)   Glucose, Bld 97  70 - 99 (mg/dL)   Calcium, Ion 6.29  5.28 - 1.32 (mmol/L)   TCO2 26  0 - 100 (mmol/L)   Hemoglobin 14.3  13.0 - 17.0 (g/dL)   HCT 41.3  24.4 - 01.0 (%)  POCT I-STAT TROPONIN I      Component Value Range   Troponin i, poc 0.00  0.00 - 0.08 (ng/mL)   Comment 3            EKG from 5/19- unchanged from prior, NSR CXR neg  Review of Systems See HPI  No SOB No dizziness    Objective:   Physical Exam BP 110/78  Pulse 68  Temp 98.2 F (36.8 C)  Wt 202 lb (91.627 kg) General:  overweght male in NAD Eyes:  PERRL Ears:  External ear exam shows no significant lesions or deformities.  Otoscopic examination reveals clear canals, tympanic membranes are intact bilaterally without bulging, retraction, inflammation or discharge. Hearing is grossly normal bilaterally. Nose:  External nasal examination shows no deformity or inflammation. Nasal mucosa are pink and moist without lesions or exudates. Mouth:  Oral mucosa and oropharynx without lesions or exudates.  Teeth in good repair. Neck:  no carotid bruit or thyromegaly no cervical or supraclavicular lymphadenopathy  Lungs:  Normal respiratory effort, chest expands symmetrically. Lungs are clear to auscultation, no crackles or wheezes. Heart:  Normal rate and regular rhythm. S1 and S2 normal without gallop, murmur, click, rub or other extra sounds. Chest wall NTTP Abdomen:  Bowel sounds positive,abdomen soft and non-tender without masses, organomegaly or hernias noted. Extremities:  no edema      Assessment & Plan:   1. Atypical chest pain  New- atypical, improving but still present. Of note, ER note states that pt would be contacted by radiology to set up appt?  ?perhaps CT angio (no d dimer in chart)? Pt was not contacted. Will refer to cardiology for stress testing given his persistence of symptoms. I do think that  anxiety is the largest factor here, especially since his chief just suddenly died of MI at young age.  Ambulatory referral to Cardiology  2. Anxiety  Deteriorated. See above. Will increase Prozac to 40 mg daily. The patient indicates understanding of these issues and agrees with the plan.

## 2011-10-03 NOTE — Patient Instructions (Signed)
Good to see you. I am so sorry for your loss. We are increasing your Prozac to 40 mg daily.  Please call me in a few weeks with an update. Please stop by to see Shirlee Limerick on your way out to set up your cardiology referral.

## 2011-10-04 ENCOUNTER — Ambulatory Visit (INDEPENDENT_AMBULATORY_CARE_PROVIDER_SITE_OTHER): Payer: BC Managed Care – PPO | Admitting: Cardiovascular Disease

## 2011-10-04 ENCOUNTER — Telehealth: Payer: Self-pay | Admitting: *Deleted

## 2011-10-04 ENCOUNTER — Encounter: Payer: Self-pay | Admitting: Cardiovascular Disease

## 2011-10-04 ENCOUNTER — Other Ambulatory Visit: Payer: Self-pay | Admitting: Family Medicine

## 2011-10-04 DIAGNOSIS — R0789 Other chest pain: Secondary | ICD-10-CM

## 2011-10-04 DIAGNOSIS — R079 Chest pain, unspecified: Secondary | ICD-10-CM

## 2011-10-04 DIAGNOSIS — R0989 Other specified symptoms and signs involving the circulatory and respiratory systems: Secondary | ICD-10-CM

## 2011-10-04 DIAGNOSIS — R0600 Dyspnea, unspecified: Secondary | ICD-10-CM

## 2011-10-04 MED ORDER — ZOLPIDEM TARTRATE 10 MG PO TABS: 10.0000 mg | ORAL_TABLET | Freq: Every evening | ORAL | Status: DC | PRN

## 2011-10-04 NOTE — Patient Instructions (Signed)
Your physician has requested that you have an exercise tolerance test. For further information please visit [email or web address]. Please also follow instruction sheet, as given.  Your physician has requested that you have an echocardiogram. Echocardiography is a painless test that uses sound waves to create images of your heart. It provides your doctor with information about the size and shape of your heart and how well your heart's chambers and valves are working. This procedure takes approximately one hour. There are no restrictions for this procedure.  Follow up as needed  

## 2011-10-04 NOTE — Procedures (Signed)
    Treadmill Stress test  Indication: Atypical chest pain.  Baseline Data:  Resting EKG shows NSR with rate of 80 bpm, no significant ST or T wave changes. Resting blood pressure of 128/72 mm Hg Stand bruce protocal was used.  Exercise Data:  Patient exercised for 9 min 26 sec,  Peak heart rate of 162 bpm.  This was 88 % of the maximum predicted heart rate. No symptoms of chest pain or lightheadedness were reported at peak stress or in recovery.  Peak Blood pressure recorded was 158/76 Maximal work level: 10.1 METs.  Heart rate at 3 minutes in recovery was 103 bpm. BP response: Normal HR response: Normal  EKG with Exercise: Sinus tachycardia with no significant ST changes. No arrhythmia.  FINAL IMPRESSION: Normal exercise stress test. No significant EKG changes concerning for ischemia. Good exercise tolerance. Low-risk Duke treadmill score.

## 2011-10-04 NOTE — Progress Notes (Signed)
HPI  This is a pleasant 36 yo [name] who was referred by Dr.Aron for evaluation of chest pain. This started at about 2 weeks ago. He describes substernal tightness without radiation which mostly happens at rest and does not worsen with physical activities. It lasts anywhere from 15 minutes to an hour it is associated with anxiety. He does have GERD but the current symptoms are different according to him. The pain does not worsen with deep breath, coughing or change in position. He has not had any recent upper or lower respiratory tract infections. He went to the emergency room at [facility] in the 19th when the chest pain woke him up from sleep. His basic workup in the ED was unremarkable.  He has been on a PPI which did not improve the chest pain. 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 However, the patient mentioned that he had the chest discomfort even before that event. Never had anything like this before.  He does have history of diabetes it actually improved significantly after he had lap band surgery last year. He lost about 50 pounds. He has no history of hypertension and does not smoke. He does have family history of coronary artery disease as his father died at the age of 69 from myocardial infarction.   No Known Allergies   Current Outpatient Prescriptions on File Prior to Visit  Medication Sig Dispense Refill  . FLUoxetine (PROZAC) 20 MG capsule Take 2 capsules (40 mg total) by mouth daily.  30 capsule  6  . glucose blood (ONE TOUCH ULTRA TEST) test strip Check blood sugar as directed       . HYDROcodone-acetaminophen (NORCO) 5-325 MG per tablet Take 2 tablets by mouth every 4 (four) hours as needed for pain.  16 tablet  0  . pantoprazole (PROTONIX) 40 MG tablet Take 1 tablet (40 mg total) by mouth daily.  30 tablet  12     Past Medical History  Diagnosis Date  . Overweight   . Depression       Past Surgical History  Procedure Date  . Appendectomy 1988  . Esophagogastroduodenoscopy 05/26/2004    Dr. Arlyce Dice  . Laparoscopic gastric banding 08/2010     Family History  Problem Relation Age of Onset  . Heart disease Father   . Heart attack Father 62     History   Social History  . Marital Status: Married    Spouse Name: N/A    Number of Children: 2  . Years of Education: N/A   Occupational History  . POLICE OFFICER    Social History Main Topics  . Smoking status: Never Smoker   . Smokeless tobacco: Never Used  . Alcohol Use: Yes  . Drug Use: No  . Sexually Active: Not on file   Other Topics Concern  . Not on file   Social History Narrative  . No narrative on file     ROS Constitutional: Negative for fever, chills, diaphoresis, activity change, appetite change and fatigue.  HENT: Negative for hearing loss, nosebleeds, congestion, sore throat, facial swelling, drooling, trouble swallowing, neck pain, voice change, sinus pressure and tinnitus.  Eyes: Negative for photophobia, pain, discharge and visual disturbance.  Respiratory: Negative for apnea, cough and wheezing.  Cardiovascular: Negative for palpitations and leg swelling.  Gastrointestinal: Negative for nausea, vomiting, abdominal pain, diarrhea, constipation, blood in stool and abdominal  distention.  Genitourinary: Negative for dysuria, urgency, frequency, hematuria and decreased urine volume.  Musculoskeletal: Negative for myalgias, back pain, joint swelling, arthralgias and gait problem.  Skin: Negative for color change, pallor, rash and wound.  Neurological: Negative for dizziness, tremors, seizures, syncope, speech difficulty, weakness, light-headedness, numbness and headaches.  Psychiatric/Behavioral: Negative for suicidal ideas, hallucinations, behavioral problems and agitation. The patient is not nervous/anxious.     PHYSICAL EXAM   BP 128/72  Pulse 73  Resp 18  Ht 5\' 5"  (1.651 m)   Wt 201 lb (91.173 kg)  BMI 33.45 kg/m2  SpO2 97% Constitutional: He is oriented to person, place, and time. He appears well-developed and well-nourished. No distress.  HENT: No nasal discharge.  Head: Normocephalic and atraumatic.  Eyes: Pupils are equal and round. Right eye exhibits no discharge. Left eye exhibits no discharge.  Neck: Normal range of motion. Neck supple. No JVD present. No thyromegaly present.  Cardiovascular: Normal rate, regular rhythm, normal heart sounds and. Exam reveals no gallop and no friction rub. No murmur heard.  Pulmonary/Chest: Effort normal and breath sounds normal. No stridor. No respiratory distress. He has no wheezes. He has no rales. He exhibits no tenderness.  Abdominal: Soft. Bowel sounds are normal. He exhibits no distension. There is no tenderness. There is no rebound and no guarding.  Musculoskeletal: Normal range of motion. He exhibits no edema and no tenderness.  Neurological: He is alert and oriented to person, place, and time. Coordination normal.  Skin: Skin is warm and dry. No rash noted. He is not diaphoretic. No erythema. No pallor.  Psychiatric: He has a normal mood and affect. His behavior is normal. Judgment and thought content normal.       EKG: Normal sinus rhythm with no significant ST or T wave changes. Normal PR and QT intervals.   ASSESSMENT AND PLAN

## 2011-10-04 NOTE — Assessment & Plan Note (Signed)
The patient's chest pain is overall atypical and is likely possibility of anxiety aggravating his symptoms. His physical exam is unremarkable and baseline ECG is within normal limits. He does have risk factors for coronary artery disease including family history as well as diabetes. I recommend proceeding with a treadmill stress test. I will also obtain an echocardiogram to evaluate his LV systolic function.  I discussed with the patient the importance of lifestyle changes in order to decrease the chance of future coronary artery disease and cardiovascular events. We discussed the importance of controlling risk factors, healthy diet as well as regular exercise. I also explained to him that a normal stress test does not rule out atherosclerosis.

## 2011-10-04 NOTE — Patient Instructions (Signed)
Your physician has requested that you have an echocardiogram. Echocardiography is a painless test that uses sound waves to create images of your heart. It provides your doctor with information about the size and shape of your heart and how well your heart's chambers and valves are working. This procedure takes approximately one hour. There are no restrictions for this procedure.   

## 2011-10-04 NOTE — Telephone Encounter (Signed)
Pt's wife called to ask that the quantity on pt's fluoxetine script that was sent in yesterday be changed to 60, since pt is to take 2 a day.  I did change script to 60 with 6 refills at [facility].  Also, wife wants you to know that pt's stress test today was negative and he will be having a 2D Echo.

## 2011-10-04 NOTE — Telephone Encounter (Signed)
Noted! Thank you

## 2011-10-05 ENCOUNTER — Other Ambulatory Visit: Payer: Self-pay

## 2011-10-05 MED ORDER — ALPRAZOLAM 0.25 MG PO TABS: 0.2500 mg | ORAL_TABLET | Freq: Three times a day (TID) | ORAL | Status: DC | PRN

## 2011-10-05 NOTE — Telephone Encounter (Signed)
pts wife,Cassa left v/m pt still having chest pain; request refill hydrocodone-APAP to [facility] [address]. Pt saw cardiologist yesterday.Please advise.

## 2011-10-05 NOTE — Telephone Encounter (Signed)
Advised pt's wife, medicine called to [facility].

## 2011-10-05 NOTE — Telephone Encounter (Signed)
Hydrocodone is not going to help much, not chest wall pain (not worsened by movement or palpation). Most likely related to anxiety.  I would recommend trying low dose xanax instead as needed. Please phone in rx as entered below- please advise of sedation and addiction potential.

## 2011-10-18 ENCOUNTER — Other Ambulatory Visit: Payer: Self-pay | Admitting: Family Medicine

## 2011-10-19 ENCOUNTER — Other Ambulatory Visit: Payer: Self-pay | Admitting: Family Medicine

## 2011-10-19 NOTE — Telephone Encounter (Signed)
Medicine called to pharmacy. 

## 2011-10-22 ENCOUNTER — Other Ambulatory Visit (INDEPENDENT_AMBULATORY_CARE_PROVIDER_SITE_OTHER): Payer: BC Managed Care – PPO

## 2011-10-22 ENCOUNTER — Other Ambulatory Visit: Payer: Self-pay

## 2011-10-22 DIAGNOSIS — R0600 Dyspnea, unspecified: Secondary | ICD-10-CM

## 2011-10-22 DIAGNOSIS — R0989 Other specified symptoms and signs involving the circulatory and respiratory systems: Secondary | ICD-10-CM

## 2011-10-22 DIAGNOSIS — R072 Precordial pain: Secondary | ICD-10-CM

## 2011-10-23 ENCOUNTER — Other Ambulatory Visit: Payer: Self-pay | Admitting: Family Medicine

## 2011-10-24 ENCOUNTER — Other Ambulatory Visit: Payer: Self-pay | Admitting: Family Medicine

## 2011-10-24 NOTE — Telephone Encounter (Signed)
Medicine called to pharmacy. 

## 2011-12-01 ENCOUNTER — Other Ambulatory Visit: Payer: Self-pay | Admitting: Family Medicine

## 2011-12-03 NOTE — Telephone Encounter (Signed)
Medication phoned to pharmacy.  

## 2011-12-03 NOTE — Telephone Encounter (Signed)
Pt states he is out of this medicine and needs refilled today, Dr. Elmer Sow patient, she will not have access to her computer this afternoon.

## 2011-12-03 NOTE — Telephone Encounter (Signed)
Please call in

## 2011-12-06 ENCOUNTER — Encounter (INDEPENDENT_AMBULATORY_CARE_PROVIDER_SITE_OTHER): Payer: BC Managed Care – PPO

## 2011-12-09 ENCOUNTER — Other Ambulatory Visit: Payer: Self-pay | Admitting: Family Medicine

## 2011-12-10 ENCOUNTER — Encounter (INDEPENDENT_AMBULATORY_CARE_PROVIDER_SITE_OTHER): Payer: Self-pay | Admitting: Physician Assistant

## 2011-12-17 NOTE — Telephone Encounter (Signed)
Pt has not called [facility] [address] she called our office first to get status of ambien refill. Notified pts wife called in on 12/03/11. She will ck with [facility].

## 2011-12-19 ENCOUNTER — Other Ambulatory Visit: Payer: Self-pay

## 2011-12-19 MED ORDER — ALPRAZOLAM 0.25 MG PO TABS: 0.2500 mg | ORAL_TABLET | Freq: Every evening | ORAL | Status: DC | PRN

## 2011-12-19 NOTE — Telephone Encounter (Signed)
Rx called in as directed.   

## 2011-12-19 NOTE — Telephone Encounter (Signed)
plz phone in. 

## 2011-12-19 NOTE — Telephone Encounter (Signed)
Pt wife called back today and said [facility] [address] did not receive refill. I spoke with Hope at [facility]; med was received on 12/03/11 but pt had gotten previously on 11/11/11 and rx was put on hold. I asked Hope to fill for pt. Patient's wife notified as instructed by telephone.

## 2011-12-19 NOTE — Telephone Encounter (Signed)
pts wife request refill for alprazolam to [facility] [address].Please advise.

## 2012-01-02 ENCOUNTER — Other Ambulatory Visit: Payer: Self-pay | Admitting: Family Medicine

## 2012-01-02 NOTE — Telephone Encounter (Signed)
plz phone in. Will route to PCP as fyi as early refill.

## 2012-01-03 NOTE — Telephone Encounter (Signed)
Rx called in as directed.   

## 2012-01-06 ENCOUNTER — Emergency Department (INDEPENDENT_AMBULATORY_CARE_PROVIDER_SITE_OTHER): Payer: BC Managed Care – PPO

## 2012-01-06 ENCOUNTER — Emergency Department (HOSPITAL_COMMUNITY)
Admission: EM | Admit: 2012-01-06 | Discharge: 2012-01-06 | Disposition: A | Discharge status: Home / Self Care | Payer: BC Managed Care – PPO | Source: Home / Self Care | Attending: Family Medicine | Admitting: Family Medicine

## 2012-01-06 ENCOUNTER — Encounter (HOSPITAL_COMMUNITY): Payer: Self-pay | Admitting: *Deleted

## 2012-01-06 DIAGNOSIS — S40019A Contusion of unspecified shoulder, initial encounter: Secondary | ICD-10-CM

## 2012-01-06 DIAGNOSIS — S40012A Contusion of left shoulder, initial encounter: Secondary | ICD-10-CM

## 2012-01-06 HISTORY — DX: Gastro-esophageal reflux disease without esophagitis: K21.9

## 2012-01-06 MED ORDER — DICLOFENAC POTASSIUM 50 MG PO TABS: 50.0000 mg | ORAL_TABLET | Freq: Three times a day (TID) | ORAL | Status: DC

## 2012-01-06 NOTE — ED Provider Notes (Signed)
History     CSN: [id number]  Arrival date & time 01/06/12  1101   First MD Initiated Contact with Patient 01/06/12 1110      Chief Complaint  Patient presents with  . Shoulder Injury  . Shoulder Pain    (Consider location/radiation/quality/duration/timing/severity/associated sxs/prior treatment) Patient is a 36 y.o. male presenting with shoulder injury. The history is provided by the patient.  Shoulder Injury This is a new problem. The current episode started 12 to 24 hours ago (landed on left shoulder playing softball yest.). The problem has not changed since onset.   Past Medical History  Diagnosis Date  . Overweight   . Depression   . Acid reflux     Past Surgical History  Procedure Date  . Appendectomy 1988  . Esophagogastroduodenoscopy 05/26/2004    Dr. Arlyce Dice  . Laparoscopic gastric banding 08/2010    Family History  Problem Relation Age of Onset  . Heart disease Father   . Heart attack Father 39    History  Substance Use Topics  . Smoking status: Never Smoker   . Smokeless tobacco: Never Used  . Alcohol Use: Yes      Review of Systems  Constitutional: Negative.   Musculoskeletal: Negative for joint swelling.    Allergies  Review of patient's allergies indicates no known allergies.  Home Medications   Current Outpatient Rx  Name Route Sig Dispense Refill  . ALPRAZOLAM 0.25 MG PO TABS  TAKE ONE TABLET BY MOUTH AT BEDTIME AS NEEDED FOR SLEEP 30 tablet 0  . FLUOXETINE HCL 20 MG PO CAPS Oral Take 2 capsules (40 mg total) by mouth daily. 30 capsule 6  . PANTOPRAZOLE SODIUM 40 MG PO TBEC Oral Take 1 tablet (40 mg total) by mouth daily. 30 tablet 12  . ZOLPIDEM TARTRATE 10 MG PO TABS  TAKE ONE TABLET BY MOUTH AT BEDTIME AS NEEDED FOR SLEEP 30 tablet 0  . DICLOFENAC POTASSIUM 50 MG PO TABS Oral Take 1 tablet (50 mg total) by mouth 3 (three) times daily. 30 tablet 0  . GLUCOSE BLOOD VI STRP  Check blood sugar as directed       BP 126/80  Pulse 83   Temp 97.6 F (36.4 C) (Oral)  Resp 16  SpO2 97%  Physical Exam  Nursing note and vitals reviewed. Constitutional: He appears well-developed and well-nourished. He appears distressed.  Musculoskeletal: He exhibits tenderness.       Left shoulder: He exhibits decreased range of motion, tenderness and bony tenderness. He exhibits no swelling, no effusion and no crepitus.       Arms: Skin: Skin is warm and dry.    ED Course  Procedures (including critical care time)  Labs Reviewed - No data to display Dg Ac Joints  01/06/2012  *RADIOLOGY REPORT*  Clinical Data: Left shoulder pain, basketball injury  LEFT ACROMIOCLAVICULAR JOINTS  Comparison: None.  Findings: No fracture or dislocation is seen.  The left AC joint is within normal limits and does not widen following administration of weights.  Visualized left lung is clear.  IMPRESSION: No fracture or dislocation is seen.  Specifically, the left [facility] joint is normal.   Original Report Authenticated By: Charline Bills, M.D.      1. Contusion of shoulder, left       MDM  X-rays reviewed and report per radiologist.         Linna Hoff, MD 01/06/12 1208

## 2012-01-06 NOTE — ED Notes (Signed)
Pt with onset of left shoulder pain onset last night playing softball fell catching ball landed on left shoulder - increased pain with movement

## 2012-01-17 ENCOUNTER — Encounter (INDEPENDENT_AMBULATORY_CARE_PROVIDER_SITE_OTHER): Payer: BC Managed Care – PPO

## 2012-01-24 ENCOUNTER — Other Ambulatory Visit: Payer: Self-pay | Admitting: *Deleted

## 2012-01-24 MED ORDER — ZOLPIDEM TARTRATE 10 MG PO TABS: 10.0000 mg | ORAL_TABLET | Freq: Every evening | ORAL | Status: DC | PRN

## 2012-01-24 NOTE — Telephone Encounter (Signed)
Medicine called to [facility] [address]. 

## 2012-01-24 NOTE — Telephone Encounter (Signed)
Faxed refill request   

## 2012-02-18 ENCOUNTER — Encounter: Payer: Self-pay | Admitting: Family Medicine

## 2012-02-18 ENCOUNTER — Ambulatory Visit (INDEPENDENT_AMBULATORY_CARE_PROVIDER_SITE_OTHER): Payer: BC Managed Care – PPO | Admitting: Family Medicine

## 2012-02-18 VITALS — BP 120/80 | HR 72 | Temp 98.1°F | Wt 181.0 lb

## 2012-02-18 DIAGNOSIS — F419 Anxiety disorder, unspecified: Secondary | ICD-10-CM

## 2012-02-18 DIAGNOSIS — F411 Generalized anxiety disorder: Secondary | ICD-10-CM

## 2012-02-18 MED ORDER — CITALOPRAM HYDROBROMIDE 20 MG PO TABS: 20.0000 mg | ORAL_TABLET | Freq: Every day | ORAL | Status: DC

## 2012-02-18 MED ORDER — ALPRAZOLAM 0.25 MG PO TABS: 0.2500 mg | ORAL_TABLET | Freq: Three times a day (TID) | ORAL | Status: DC | PRN

## 2012-02-18 MED ORDER — ZOLPIDEM TARTRATE 10 MG PO TABS: 10.0000 mg | ORAL_TABLET | Freq: Every evening | ORAL | Status: DC | PRN

## 2012-02-18 NOTE — Patient Instructions (Addendum)
I'm sorry to hear all you are going through. Hang in there. Let's wean off the prozac: 1 tablet every other day for 1 week, then 1/2 tablet every other day for 1 week and stop. Go ahead and start the celexa right away.

## 2012-02-18 NOTE — Progress Notes (Signed)
HPI:  36 yo with h/o anxiety here to discuss worsening anxiety and depression.  His wife recently asked her to move out.  She wants to get a divorce and does not want to work things out. Eric [name] is obviously upset about this. Sleeping ok with the ambien. Decreased appetite- has lost 20 pounds since May. Feels anxious and sad. No SI or HI.  On prozac 40 mg daily- feels it is no longer working.  Taking xanax 0.25 mg at night- wants to take it during the day if he is not at work and feeling anxious.  Patient Active Problem List  Diagnosis  . DM  . VARICOSE VEINS, LOWER EXTREMITIES  . GERD  . GASTRIC ULCER  . SEBACEOUS CYST, NECK  . INSOMNIA  . CHEST PAIN  . CHEST WALL PAIN, ACUTE  . OBESITY, MORBID  . Anxiety  . Atypical chest pain   Past Medical History  Diagnosis Date  . Overweight   . Depression   . Acid reflux    Past Surgical History  Procedure Date  . Appendectomy 1988  . Esophagogastroduodenoscopy 05/26/2004    Dr. Arlyce Dice  . Laparoscopic gastric banding 08/2010   History  Substance Use Topics  . Smoking status: Never Smoker   . Smokeless tobacco: Never Used  . Alcohol Use: Yes   Family History  Problem Relation Age of Onset  . Heart disease Father   . Heart attack Father 57   No Known Allergies Current Outpatient Prescriptions on File Prior to Visit  Medication Sig Dispense Refill  . glucose blood (ONE TOUCH ULTRA TEST) test strip Check blood sugar as directed       . pantoprazole (PROTONIX) 40 MG tablet Take 1 tablet (40 mg total) by mouth daily.  30 tablet  12  . citalopram (CELEXA) 20 MG tablet Take 1 tablet (20 mg total) by mouth daily.  30 tablet  2   The PMH, PSH, Social History, Family History, Medications, and allergies have been reviewed in [facility], and have been updated if relevant.   BP 120/80  Pulse 72  Temp 98.1 F (36.7 C)  Wt 181 lb (82.101 kg) Wt Readings from Last 3 Encounters:  02/18/12 181 lb (82.101 kg)  10/04/11 201 lb (91.173  kg)  10/04/11 201 lb (91.173 kg)   Gen: alert, pleasant, NAD Psych:  Good eye contact, depressed appearing  Assessment and Plan: 1. Anxiety   >25 min spent with face to face with patient, >50% counseling and/or coordinating care Deteriorated. He is going to talk with a counselor at work- I encouraged him to proceed with this. Wean off prozac (see pt instructions) and start celexa. Call me in 3 weeks with an update. The patient indicates understanding of these issues and agrees with the plan.

## 2012-02-22 ENCOUNTER — Inpatient Hospital Stay: Payer: Self-pay | Admitting: Internal Medicine

## 2012-02-22 LAB — CSF CELL CT + PROT + GLU PANEL
CSF Tube #: 1
Lymphocytes: 43 %
Monocytes/Macrophages: 0 %
Neutrophils: 57 %
Protein, CSF: 35 mg/dL (ref 15–45)
RBC (CSF): 1759 /mm3
WBC (CSF): 21 /mm3

## 2012-02-22 LAB — URINALYSIS, COMPLETE
Bacteria: NONE SEEN
Bilirubin,UR: NEGATIVE
Ketone: NEGATIVE
Ph: 7 (ref 4.5–8.0)
Protein: NEGATIVE
Squamous Epithelial: NONE SEEN
WBC UR: 3 /HPF (ref 0–5)

## 2012-02-22 LAB — COMPREHENSIVE METABOLIC PANEL
Albumin: 4.2 g/dL (ref 3.4–5.0)
Alkaline Phosphatase: 80 U/L (ref 50–136)
BUN: 12 mg/dL (ref 7–18)
Bilirubin,Total: 1.9 mg/dL — ABNORMAL HIGH (ref 0.2–1.0)
Calcium, Total: 8.8 mg/dL (ref 8.5–10.1)
Creatinine: 1.04 mg/dL (ref 0.60–1.30)
Osmolality: 277 (ref 275–301)
SGOT(AST): 29 U/L (ref 15–37)
SGPT (ALT): 38 U/L (ref 12–78)
Sodium: 139 mmol/L (ref 136–145)

## 2012-02-22 LAB — DRUG SCREEN, URINE
Barbiturates, Ur Screen: NEGATIVE (ref ?–200)
Benzodiazepine, Ur Scrn: NEGATIVE (ref ?–200)
Cannabinoid 50 Ng, Ur ~~LOC~~: NEGATIVE (ref ?–50)
MDMA (Ecstasy)Ur Screen: NEGATIVE (ref ?–500)
Methadone, Ur Screen: NEGATIVE (ref ?–300)
Phencyclidine (PCP) Ur S: NEGATIVE (ref ?–25)

## 2012-02-22 LAB — CSF CELL COUNT WITH DIFFERENTIAL
CSF Tube #: 3
Lymphocytes: 71 %
Monocytes/Macrophages: 14 %
Neutrophils: 14 %
WBC (CSF): 5 /mm3

## 2012-02-22 LAB — CBC
HCT: 45 % (ref 40.0–52.0)
HGB: 15.6 g/dL (ref 13.0–18.0)
MCV: 90 fL (ref 80–100)
RDW: 13.3 % (ref 11.5–14.5)
WBC: 18 10*3/uL — ABNORMAL HIGH (ref 3.8–10.6)

## 2012-02-22 LAB — TROPONIN I: Troponin-I: <0.02 ng/mL

## 2012-02-22 LAB — ETHANOL
Ethanol %: <0.003 % (ref 0.000–0.080)
Ethanol: <3 mg/dL

## 2012-02-22 LAB — ACETAMINOPHEN LEVEL: Acetaminophen: <2 ug/mL

## 2012-02-22 LAB — PROTIME-INR: INR: 0.9

## 2012-02-22 LAB — MAGNESIUM: Magnesium: 1.4 mg/dL — ABNORMAL LOW

## 2012-02-23 LAB — CBC WITH DIFFERENTIAL/PLATELET
Basophil #: 0 10*3/uL (ref 0.0–0.1)
Eosinophil #: 0 10*3/uL (ref 0.0–0.7)
HGB: 14.3 g/dL (ref 13.0–18.0)
Lymphocyte %: 16.1 %
MCHC: 35.4 g/dL (ref 32.0–36.0)
Neutrophil %: 74.2 %
RBC: 4.45 10*6/uL (ref 4.40–5.90)
RDW: 13.3 % (ref 11.5–14.5)
WBC: 7.6 10*3/uL (ref 3.8–10.6)

## 2012-02-24 LAB — CREATININE, SERUM: Creatinine: 0.96 mg/dL (ref 0.60–1.30)

## 2012-02-25 LAB — CSF CULTURE W GRAM STAIN

## 2012-03-03 ENCOUNTER — Other Ambulatory Visit: Payer: Self-pay

## 2012-03-03 ENCOUNTER — Other Ambulatory Visit: Payer: Self-pay | Admitting: *Deleted

## 2012-03-03 NOTE — Telephone Encounter (Signed)
Ok to write note stating it is ok for him to return to work.

## 2012-03-03 NOTE — Telephone Encounter (Signed)
Pt is asking for work note releasing him to go back to work.  He went today back today.  Please call when ready.

## 2012-03-03 NOTE — Telephone Encounter (Signed)
Pt left v/m requesting refill on ambien to [facility] [address].I spoke with Melissa at [facility] and she does not have refill ambien called in on 02/18/12. Gave verbal order as instructed. Left v/m for pt to call back.

## 2012-03-03 NOTE — Telephone Encounter (Signed)
Advised patient medicine was called to [facility] and note is ready for pick up, note placed at front desk.

## 2012-03-17 ENCOUNTER — Encounter: Payer: Self-pay | Admitting: Family Medicine

## 2012-03-17 ENCOUNTER — Ambulatory Visit (INDEPENDENT_AMBULATORY_CARE_PROVIDER_SITE_OTHER): Payer: BC Managed Care – PPO | Admitting: Family Medicine

## 2012-03-17 VITALS — BP 130/90 | HR 72 | Temp 98.2°F | Wt 180.0 lb

## 2012-03-17 DIAGNOSIS — F32A Depression, unspecified: Secondary | ICD-10-CM

## 2012-03-17 DIAGNOSIS — F329 Major depressive disorder, single episode, unspecified: Secondary | ICD-10-CM

## 2012-03-17 DIAGNOSIS — F332 Major depressive disorder, recurrent severe without psychotic features: Secondary | ICD-10-CM | POA: Insufficient documentation

## 2012-03-17 MED ORDER — VENLAFAXINE HCL 37.5 MG PO TABS: ORAL_TABLET | ORAL | Status: DC

## 2012-03-17 NOTE — Patient Instructions (Addendum)
To wean off celexa-  1 tablet every other day for 1 week, then 1/2 tablet every other day for 1 week and stop.  We are starting Effexor- 1 tablet by mouth twice daily- start out taking 1 tablet by mouth daily for first 4 days.  Please stop by to see Shirlee Limerick on your way out.

## 2012-03-17 NOTE — Progress Notes (Signed)
HPI:  36 yo with h/o anxiety here to discuss worsening anxiety and depression with his wife.  His wife recently asked her to move out.  She wants to get a divorce and does not want to work things out.  Eric [name] is obviously upset about this. Sleeping ok with the ambien. Decreased appetite- has lost 20 pounds since May. Feels anxious and sad. No SI or HI.  Was on  40 mg daily- felt  it was longer working so we switched to celexa.  He feels worse.  More depressed.  Taking xanax 0.25 mg at night- wants to take it during the day if he is not at work and feeling anxious.  Denies any manic symptoms.  No family h/o psychiatric issues.  Patient Active Problem List  Diagnosis  . DM  . VARICOSE VEINS, LOWER EXTREMITIES  . GERD  . GASTRIC ULCER  . SEBACEOUS CYST, NECK  . INSOMNIA  . CHEST PAIN  . CHEST WALL PAIN, ACUTE  . OBESITY, MORBID  . Anxiety  . Atypical chest pain  . Depression   Past Medical History  Diagnosis Date  . Overweight   . Depression   . Acid reflux    Past Surgical History  Procedure Date  . Appendectomy 1988  . Esophagogastroduodenoscopy 05/26/2004    Dr. Arlyce Dice  . Laparoscopic gastric banding 08/2010   History  Substance Use Topics  . Smoking status: Never Smoker   . Smokeless tobacco: Never Used  . Alcohol Use: Yes   Family History  Problem Relation Age of Onset  . Heart disease Father   . Heart attack Father 54   No Known Allergies Current Outpatient Prescriptions on File Prior to Visit  Medication Sig Dispense Refill  . ALPRAZolam (XANAX) 0.25 MG tablet Take 1 tablet (0.25 mg total) by mouth 3 (three) times daily as needed for sleep or anxiety.  90 tablet  0  . glucose blood (ONE TOUCH ULTRA TEST) test strip Check blood sugar as directed       . pantoprazole (PROTONIX) 40 MG tablet Take 1 tablet (40 mg total) by mouth daily.  30 tablet  12  . zolpidem (AMBIEN) 10 MG tablet Take 1 tablet (10 mg total) by mouth at bedtime as needed for sleep.  30  tablet  0  . venlafaxine (EFFEXOR) 37.5 MG tablet 1 tablet by mouth daily x 4 days, then increase to 1 tablet by mouth twice daily.  60 tablet  0   The PMH, PSH, Social History, Family History, Medications, and allergies have been reviewed in [facility], and have been updated if relevant.   BP 130/90  Pulse 72  Temp 98.2 F (36.8 C)  Wt 180 lb (81.647 kg) Wt Readings from Last 3 Encounters:  03/17/12 180 lb (81.647 kg)  02/18/12 181 lb (82.101 kg)  10/04/11 201 lb (91.173 kg)   Gen: alert, pleasant, NAD Psych:  Good eye contact, depressed appearing and tearful.  Assessment and Plan: 1. Depression  Ambulatory referral to Psychiatry  >25 min spent with face to face with patient, >50% counseling and/or coordinating care Deteriorated. Refer to psychiatry for further evaluation and treatment. Wean off celexa (see pt instructions) and start effexor. Call me in 3 weeks with an update. The patient indicates understanding of these issues and agrees with the plan.

## 2012-03-24 ENCOUNTER — Other Ambulatory Visit: Payer: Self-pay | Admitting: Family Medicine

## 2012-03-25 ENCOUNTER — Other Ambulatory Visit: Payer: Self-pay | Admitting: Family Medicine

## 2012-03-25 NOTE — Telephone Encounter (Signed)
Medicine called to [facility]. 

## 2012-03-31 ENCOUNTER — Ambulatory Visit (INDEPENDENT_AMBULATORY_CARE_PROVIDER_SITE_OTHER): Payer: BC Managed Care – PPO | Admitting: Psychiatry

## 2012-03-31 ENCOUNTER — Encounter (HOSPITAL_COMMUNITY): Payer: Self-pay | Admitting: Psychiatry

## 2012-03-31 VITALS — BP 102/72 | HR 82 | Ht 65.0 in | Wt 180.0 lb

## 2012-03-31 DIAGNOSIS — F329 Major depressive disorder, single episode, unspecified: Secondary | ICD-10-CM

## 2012-03-31 MED ORDER — VENLAFAXINE HCL ER 75 MG PO CP24: 75.0000 mg | ORAL_CAPSULE | Freq: Every day | ORAL | Status: DC

## 2012-03-31 NOTE — Progress Notes (Signed)
Psychiatric Assessment Adult  Patient Identification:  Eric [name] Date of Evaluation:  03/31/2012 Chief Complaint:   Chief Complaint  Patient presents with  . Depression  . Anxiety   History of Chief Complaint:   HPI Comments: [name] is a 36 y/o male with a past psychiatric history significant for Depression, NOS. The patient is referred for psychiatric services for psychiatric evaluation and medication management.   The patient reports that his main stressors are: "being separated"- He reports that he and his wife separated.  His wife told the patient that she "needed a break."  The patient reports that he had been having mood swings for the past year. He states he never realized he was having mood swings, but in retrospect has gotten worse as his son and stepdaughter have gotten older.  The patient reports he is the disciplinarian of the house and would sometimes "blow up."  He states that her daughter has Bipolar disorder, and he reports that her daughter would play the patient against his wife for the past 3-4 years. The patient reports that he is getting along better with his wife, although she appears to be moving forward in her life.  He believes she was talking to someone 4-5 months ago.    In the area of affective symptoms, patient appears mildly depressed. Patient denies current suicidal ideation, intent, or plan. Patient denies current homicidal ideation, intent, or plan. Patient denies auditory hallucinations. Patient denies visual hallucinations. Patient denies symptoms of paranoia. Patient states sleep is poor, with approximately 6-7 hours of sleep per night. Appetite is fair. Energy level is poor. Patient denies symptoms of anhedonia. Patient endorses some hopelessness, helplessness, but denies guilt.   Denies any recent episodes consistent with mania, particularly decreased need for sleep with increased energy, grandiosity, impulsivity, hyperverbal and pressured speech, or  increased productivity. Denies any recent symptoms consistent with psychosis, particularly auditory or visual hallucinations, thought broadcasting/insertion/withdrawal, or ideas of reference. Also denies excessive worry to the point of physical symptoms as well as any panic attacks. Denies any history of trauma or symptoms consistent with PTSD such as flashbacks, nightmares, hypervigilance, feelings of numbness or inability to connect with others.   The patient reports that he was initiated on fluoxetine but felt that the medication wasn't helping. He denied any side effects from fluoxetine but states a 40 mg did not depression and mood swings. He was then switched to citalopram and started to have headaches.  He is currently on venlafaxine 75 mg.   The patient reports that he has been on Ambien for the past few months but has done things in his sleep including arguing with wife and sleep walking, and sleep eating.  Review of Systems  Constitutional: Negative.   Respiratory: Negative.   Cardiovascular: Negative.   Gastrointestinal: Negative.   Neurological: Negative.    Filed Vitals:   03/31/12 0910  BP: 102/72  Pulse: 82  Height: 5\' 5"  (1.651 m)  Weight: 180 lb (81.647 kg)   Physical Exam  Vitals reviewed. Constitutional: He appears well-developed and well-nourished. No distress.  Skin: He is not diaphoretic.   Traumatic Brain Injury: No   Past Psychiatric History: Diagnosis: Depression, NOS  Hospitalizations: Patient denies.  Outpatient Care: Patient denies.  Substance Abuse Care: Patient denies.  Self-Mutilation: Patient denies.  Suicidal Attempts: Patient denies.  Violent Behaviors: Patient denies.   Past Medical History:   Past Medical History  Diagnosis Date  . Overweight   . Depression   .  Acid reflux    History of Loss of Consciousness:  No Seizure History:  No Cardiac History:  No  Allergies:  No Known Allergies Current Medications:  Current Outpatient  Prescriptions  Medication Sig Dispense Refill  . ALPRAZolam (XANAX) 0.25 MG tablet TAKE ONE TABLET BY MOUTH THREE TIMES DAILY AS NEEDED FOR ANXIETY/SLEEP  90 tablet  0  . glucose blood (ONE TOUCH ULTRA TEST) test strip Check blood sugar as directed       . pantoprazole (PROTONIX) 40 MG tablet Take 1 tablet (40 mg total) by mouth daily.  30 tablet  12  . venlafaxine (EFFEXOR) 37.5 MG tablet 1 tablet by mouth daily x 4 days, then increase to 1 tablet by mouth twice daily.  60 tablet  0  . zolpidem (AMBIEN) 10 MG tablet Take 1 tablet (10 mg total) by mouth at bedtime as needed for sleep.  30 tablet  0    Previous Psychotropic Medications:  Medication Dose   Venlafaxine: Currently  75 mg   Alprazolam: currently  0.25 mg   Zolpidem  10 mg   Citalopram:    Fluoxetine    Substance Abuse History in the last 12 months: Caffeine: Patient denies. Tobacco: Cigarettes: 3-4 per day. Alcohol:Patient denies. Illicit Drugs: Patient denies.  Medical Consequences of Substance Abuse:N/A  Legal Consequences of Substance Abuse:N/A  Family Consequences of Substance Abuse: N/A  Blackouts:  No DT's:  No Withdrawal Symptoms:  No None  Social History: Current Place of Residence: [place], [place] Place of Birth: [place], [place] Family Members: Has an two older brothers, and one older sister: Eric [name], Eric [name]) Marital Status:  Separated Children: 2  Sons: 1  Daughters: 1 Relationships: The patient has been living with his brother since his seperation from his wife Education:  HS Graduate Educational Problems/Performance: B's,C's, had problems with spelling Religious Beliefs/Practices: Goes to [employer]. History of Abuse: none Occupational Experiences: Works for the town of [place], [place]. He reports he enjoys his job. Military History:  None. Legal History: None Hobbies/Interests: Plays with son, plays sports with his son.   Family History:   Family History  Problem Relation Age of  Onset  . Heart disease Father   . Heart attack Father 49    Mental Status Examination/Evaluation: Objective:  Appearance: Casual and Neat  Eye Contact::  Good  Speech:  Clear and Coherent and Normal Rate  Volume:  Normal  Mood: "fair"  Affect:  Appropriate, Congruent and Full Range  Thought Process:  Coherent, Logical and Loose  Orientation:  Full  Thought Content:  WDL  Suicidal Thoughts:  No  Homicidal Thoughts:  No  Judgement:  Good  Insight:  Good  Psychomotor Activity:  Normal  Akathisia:  Yes  Handed:  Right  Memory: Intact recent and immediate  Assets:  Communication Skills Desire for Improvement Financial Resources/Insurance Housing Intimacy Leisure Time Physical Health Resilience Social Support Talents/Skills Transportation Vocational/Educational    Laboratory/X-Ray Psychological Evaluation(s)  None  None   Assessment:   AXIS I Depressive Disorder NOS  AXIS II No diagnosis  AXIS III Past Medical History  Diagnosis Date  . Overweight   . Depression   . Acid reflux      AXIS IV economic problems, educational problems, housing problems, occupational problems, other psychosocial or environmental problems, problems related to legal system/crime, problems related to social environment, problems with access to health care services and problems with primary support group  AXIS V 51-60 moderate symptoms   Treatment Plan/Recommendations:  PLAN:  1. Affirm with the patient that the medications are taken as ordered. Patient expressed understanding of how their medications were to be used.  2. Continue the following psychiatric medications as written prior to this appointment with the following changes:  a) Increase venlafaxine 112.5 mg B) Alprazolam 0.25 mg only as needed C) Discontinue Ambien.  3. Therapy: brief supportive therapy provided. Continue current services.  4. Risks and benefits, side effects and alternatives discussed with patient, he was given  an opportunity to ask questions about his/her medication, illness, and treatment. All current psychiatric medications have been reviewed and discussed with the patient and adjusted as clinically appropriate. The patient has been provided an accurate and updated list of the medications being now prescribed.  5. Patient told to call clinic if any problems occur. Patient advised to go to ER  if he should develop SI/HI, side effects, or if symptoms worsen. Has crisis numbers to call if needed.   6. No labs warranted at this time.  7. The patient was encouraged to keep all PCP and specialty clinic appointments.  8. Patient was instructed to return to clinic in 1 month.  9. The patient was advised to call and cancel their mental health appointment within 24 hours of the appointment, if they are unable to keep the appointment, as well as the three no show and termination from clinic policy. 10. The patient expressed understanding of the plan and agrees with the above.   Jacqulyn Cane, MD 11/18/20139:01 AM

## 2012-04-14 ENCOUNTER — Other Ambulatory Visit: Payer: Self-pay | Admitting: Family Medicine

## 2012-04-14 NOTE — Telephone Encounter (Signed)
Medicine called to [facility]. 

## 2012-04-15 ENCOUNTER — Other Ambulatory Visit: Payer: Self-pay | Admitting: Family Medicine

## 2012-04-16 NOTE — Telephone Encounter (Signed)
Left message asking patient to call back

## 2012-04-16 NOTE — Telephone Encounter (Signed)
Declined.  Per psychiatry note in Epic, he was advised to stop this medication.  Please advise him to call psychiatrist if he would like to restart.

## 2012-04-17 ENCOUNTER — Encounter (INDEPENDENT_AMBULATORY_CARE_PROVIDER_SITE_OTHER): Payer: BC Managed Care – PPO

## 2012-04-17 NOTE — Telephone Encounter (Signed)
Pt left v/m returning call. Call pt [phone].

## 2012-04-17 NOTE — Telephone Encounter (Signed)
Advised pt to follow up with psychiatrist regarding sleep meds.

## 2012-04-28 ENCOUNTER — Ambulatory Visit (HOSPITAL_COMMUNITY): Payer: Self-pay | Admitting: Psychiatry

## 2012-05-28 ENCOUNTER — Other Ambulatory Visit: Payer: Self-pay | Admitting: Family Medicine

## 2012-05-28 MED ORDER — PANTOPRAZOLE SODIUM 40 MG PO TBEC: 40.0000 mg | DELAYED_RELEASE_TABLET | Freq: Every day | ORAL | Status: DC

## 2012-05-29 ENCOUNTER — Encounter (INDEPENDENT_AMBULATORY_CARE_PROVIDER_SITE_OTHER): Payer: Self-pay

## 2012-05-29 ENCOUNTER — Ambulatory Visit (INDEPENDENT_AMBULATORY_CARE_PROVIDER_SITE_OTHER): Payer: BC Managed Care – PPO | Admitting: Physician Assistant

## 2012-05-29 VITALS — BP 100/64 | HR 78 | Resp 16 | Ht 65.5 in | Wt 188.0 lb

## 2012-05-29 DIAGNOSIS — Z4651 Encounter for fitting and adjustment of gastric lap band: Secondary | ICD-10-CM

## 2012-05-29 NOTE — Progress Notes (Signed)
  HISTORY: Eric [name] is a 37 y.o.male who received an AP-Large lap-band in April 2012 by Dr. Johna Sheriff. He has lost 24 lbs since his last appointment in April 2013. He says he got down to about 176 but has gotten hungry and is able to tolerate a large portion size. He denies persistent regurgitation or reflux. He would like a fill today.  VITAL SIGNS: Filed Vitals:   05/29/12 0952  BP: 100/64  Pulse: 78  Resp: 16    PHYSICAL EXAM: Physical exam reveals a very well-appearing 36 y.o.male in no apparent distress Neurologic: Awake, alert, oriented Psych: Bright affect, conversant Respiratory: Breathing even and unlabored. No stridor or wheezing Abdomen: Soft, nontender, nondistended to palpation. Incisions well-healed. No incisional hernias. Port easily palpated. Extremities: Atraumatic, good range of motion.  ASSESMENT: 37 y.o.  male  S/P AP-Large lap-band.   PLAN: The patient's port was accessed with a 20G Huber needle without difficulty. Clear fluid was aspirated and 0.5 mL saline was added to the port to give a total predicted volume of 8.5 mL. The patient was able to swallow water without difficulty following the procedure and was instructed to take clear liquids for the next 24-48 hours and advance slowly as tolerated.

## 2012-05-29 NOTE — Patient Instructions (Signed)
Take clear liquids tonight. Thin protein shakes are ok to start tomorrow morning. Slowly advance your diet thereafter. Call us if you have persistent vomiting or regurgitation, night cough or reflux symptoms. Return as scheduled or sooner if you notice no changes in hunger/portion sizes.  

## 2012-06-13 ENCOUNTER — Other Ambulatory Visit: Payer: Self-pay | Admitting: Family Medicine

## 2012-06-16 ENCOUNTER — Other Ambulatory Visit: Payer: Self-pay | Admitting: Family Medicine

## 2012-06-16 NOTE — Telephone Encounter (Signed)
Medicine called to [facility]. 

## 2012-07-08 ENCOUNTER — Encounter (HOSPITAL_COMMUNITY): Payer: Self-pay | Admitting: Psychiatry

## 2012-07-08 ENCOUNTER — Ambulatory Visit (INDEPENDENT_AMBULATORY_CARE_PROVIDER_SITE_OTHER): Payer: BC Managed Care – PPO | Admitting: Psychiatry

## 2012-07-08 VITALS — BP 116/77 | HR 93 | Ht 65.5 in | Wt 179.0 lb

## 2012-07-08 DIAGNOSIS — F329 Major depressive disorder, single episode, unspecified: Secondary | ICD-10-CM

## 2012-07-08 MED ORDER — TRAZODONE HCL 50 MG PO TABS: ORAL_TABLET | ORAL | Status: DC

## 2012-07-08 MED ORDER — VENLAFAXINE HCL ER 37.5 MG PO CP24: 37.5000 mg | ORAL_CAPSULE | Freq: Every day | ORAL | Status: DC

## 2012-07-08 MED ORDER — VENLAFAXINE HCL ER 75 MG PO CP24: 75.0000 mg | ORAL_CAPSULE | Freq: Every day | ORAL | Status: DC

## 2012-07-08 NOTE — Progress Notes (Addendum)
[facility] Follow-up Outpatient Visit  Eric [name] Sep 25, 1975  Date: 07/08/2012   Chief Complaint  Patient presents with  . Depression  . Anxiety   History of Chief Complaint:   HPI Comments: Eric [name] is a 37 y/o male with a past psychiatric history significant for Depression, NOS. The patient is referred for psychiatric services for medication management.   The patient reports that his relationship with his wife has deteriorated as he is unable to "give her space."  He reports he will text her to check on her but she does not see that as concern but rather his intrusion into her space.  The patient reports he is taking his medications and denies any side effects.   In the area of affective symptoms, patient appears mildly depressed. Patient denies current suicidal ideation, intent, or plan. Patient denies current homicidal ideation, intent, or plan. Patient denies auditory hallucinations. Patient denies visual hallucinations. Patient denies symptoms of paranoia. Patient states sleep is poor, with approximately 4-5 hours of sleep per night. Appetite is fair. Energy level is poor. Patient reports continued symptoms of anhedonia. Patient endorses some hopelessness, helplessness, but denies guilt.   Denies any recent episodes consistent with mania, particularly decreased need for sleep with increased energy, grandiosity, impulsivity, hyperverbal and pressured speech, or increased productivity. Denies any recent symptoms consistent with psychosis, particularly auditory or visual hallucinations, thought broadcasting/insertion/withdrawal, or ideas of reference. Also denies excessive worry to the point of physical symptoms as well as any panic attacks. Denies any history of trauma or symptoms consistent with PTSD such as flashbacks, nightmares, hypervigilance, feelings of numbness or inability to connect with others.    Review of Systems  Constitutional: Negative.   Respiratory:  Negative.   Cardiovascular: Negative.   Gastrointestinal: Negative.   Neurological: Negative.    Filed Vitals:   07/08/12 1143  BP: 116/77  Pulse: 93  Height: 5' 5.5" (1.664 m)  Weight: 179 lb (81.194 kg)   Physical Exam  Vitals reviewed. Constitutional: He appears well-developed and well-nourished. No distress.  Skin: He is not diaphoretic.   Traumatic Brain Injury: No   Past Psychiatric History: Diagnosis: Depression, NOS  Hospitalizations: Patient denies.  Outpatient Care: Patient denies.  Substance Abuse Care: Patient denies.  Self-Mutilation: Patient denies.  Suicidal Attempts: Patient denies.  Violent Behaviors: Patient denies.   Past Medical History:   Past Medical History  Diagnosis Date  . Overweight   . Depression   . Acid reflux    History of Loss of Consciousness:  No Seizure History:  No Cardiac History:  No  Allergies:  No Known Allergies Current Medications:  Current Outpatient Prescriptions  Medication Sig Dispense Refill  . glucose blood (ONE TOUCH ULTRA TEST) test strip Check blood sugar as directed       . pantoprazole (PROTONIX) 40 MG tablet Take 1 tablet (40 mg total) by mouth daily.  30 tablet  12  . venlafaxine (EFFEXOR) 37.5 MG tablet 1 tablet by mouth daily x 4 days, then increase to 1 tablet by mouth twice daily.  60 tablet  0    Previous Psychotropic Medications:  Medication Dose   Venlafaxine: Currently  75 mg   Alprazolam: currently  0.25 mg   Zolpidem  10 mg   Citalopram:    Fluoxetine    Substance Abuse History in the last 12 months: Caffeine: Patient denies. Tobacco: Cigarettes: 3-4 per day. Alcohol:Patient denies. Illicit Drugs: Patient denies.  Medical Consequences of Substance Abuse:N/A  Legal Consequences of Substance Abuse:N/A  Family Consequences of Substance Abuse: N/A  Blackouts:  No DT's:  No Withdrawal Symptoms:  No None  Social History: Current Place of Residence: [place], [place] Place of Birth:  [place], [place] Family Members: Has an two older brothers, and one older sister: Theodoro Grist, Johny Chess) Marital Status:  Separated Children: 2  Sons: 1  Daughters: 1 Relationships: The patient has been living with his brother since his seperation from his wife Education:  HS Graduate Educational Problems/Performance: B's,C's, had problems with spelling Religious Beliefs/Practices: Goes to [employer]. History of Abuse: none Occupational Experiences: Works for the town of [place], [place]. He reports he enjoys his job. Military History:  None. Legal History: None Hobbies/Interests: Plays with son, plays sports with his son.   Family History:   Family History  Problem Relation Age of Onset  . Heart disease Father   . Heart attack Father 11    Mental Status Examination/Evaluation: Objective:  Appearance: Casual and Neat  Eye Contact::  Good  Speech:  Clear and Coherent and Normal Rate  Volume:  Normal  Mood: "okay" 7/10  Affect:  Appropriate, Congruent and Full Range  Thought Process:  Coherent, Logical and Loose  Orientation:  Full  Thought Content:  WDL  Suicidal Thoughts:  No  Homicidal Thoughts:  No  Judgement:  Good  Insight:  Good  Psychomotor Activity:  Normal  Akathisia:  Yes  Handed:  Right  Memory: Intact recent and immediate  Assets:  Communication Skills Desire for Improvement Financial Resources/Insurance Housing Intimacy Leisure Time Physical Health Resilience Social Support Talents/Skills Transportation Vocational/Educational    Laboratory/X-Ray Psychological Evaluation(s)  None  None   Assessment:   AXIS I Depressive Disorder NOS  AXIS II No diagnosis  AXIS III Past Medical History  Diagnosis Date  . Overweight   . Depression   . Acid reflux      AXIS IV economic problems, educational problems, housing problems, occupational problems, other psychosocial or environmental problems, problems related to legal system/crime, problems related  to social environment, problems with access to health care services and problems with primary support group  AXIS V 51-60 moderate symptoms   Treatment Plan/Recommendations:  PLAN:  1. Affirm with the patient that the medications are taken as ordered. Patient expressed understanding of how their medications were to be used.  2. Continue the following psychiatric medications as written prior to this appointment with the following changes:  a) Increase venlafaxine to  112.5 mg- 75 mg and 37.5 for continued depressive symptoms. B) Trazodone 50 mg, one half to one tablet. 3. Therapy: brief supportive therapy provided. Discussed psychosocial stressors in detail. 4. Risks and benefits, side effects and alternatives discussed with patient, he was given an opportunity to ask questions about his/her medication, illness, and treatment. All current psychiatric medications have been reviewed and discussed with the patient and adjusted as clinically appropriate. The patient has been provided an accurate and updated list of the medications being now prescribed.  5. Patient told to call clinic if any problems occur. Patient advised to go to ER  if he should develop SI/HI, side effects, or if symptoms worsen. Has crisis numbers to call if needed.   6. No labs warranted at this time.  7. The patient was encouraged to keep all PCP and specialty clinic appointments.  8. Patient was instructed to return to clinic in 1 month.  9. The patient was advised to call and cancel their mental health appointment within 24 hours  of the appointment, if they are unable to keep the appointment, as well as the three no show and termination from clinic policy. 10. The patient expressed understanding of the plan and agrees with the above.   Jacqulyn Cane, MD 07/09/2011 11:43 AM

## 2012-07-14 ENCOUNTER — Telehealth (HOSPITAL_COMMUNITY): Payer: Self-pay

## 2012-07-14 NOTE — Telephone Encounter (Signed)
SLEEP MEDICATION NOT WORKING

## 2012-07-14 NOTE — Telephone Encounter (Signed)
The patient reports that he is still not sleeping at 50 mg. Advised to increase to 75 mg tonight with the option to take 100 mg total.

## 2012-07-16 ENCOUNTER — Ambulatory Visit: Payer: Self-pay | Admitting: Family Medicine

## 2012-07-23 ENCOUNTER — Telehealth (HOSPITAL_COMMUNITY): Payer: Self-pay

## 2012-07-23 DIAGNOSIS — F329 Major depressive disorder, single episode, unspecified: Secondary | ICD-10-CM

## 2012-07-23 MED ORDER — TRAZODONE HCL 100 MG PO TABS: ORAL_TABLET | ORAL | Status: DC

## 2012-07-23 NOTE — Telephone Encounter (Signed)
Will call in prescription for trazodone 100 mg daily.

## 2012-08-04 LAB — COMPREHENSIVE METABOLIC PANEL
Anion Gap: 6 — ABNORMAL LOW (ref 7–16)
BUN: 10 mg/dL (ref 7–18)
Calcium, Total: 8.7 mg/dL (ref 8.5–10.1)
Chloride: 105 mmol/L (ref 98–107)
Creatinine: 1.03 mg/dL (ref 0.60–1.30)
EGFR (African American): >60
Osmolality: 276 (ref 275–301)
Potassium: 3.8 mmol/L (ref 3.5–5.1)
SGOT(AST): 38 U/L — ABNORMAL HIGH (ref 15–37)
SGPT (ALT): 32 U/L (ref 12–78)

## 2012-08-04 LAB — CBC
HCT: 42.1 % (ref 40.0–52.0)
RBC: 4.77 10*6/uL (ref 4.40–5.90)
RDW: 12.9 % (ref 11.5–14.5)
WBC: 10.2 10*3/uL (ref 3.8–10.6)

## 2012-08-04 LAB — ETHANOL: Ethanol: <3 mg/dL

## 2012-08-04 LAB — ACETAMINOPHEN LEVEL: Acetaminophen: <2 ug/mL

## 2012-08-05 ENCOUNTER — Inpatient Hospital Stay: Payer: Self-pay | Admitting: Psychiatry

## 2012-08-05 LAB — DRUG SCREEN, URINE
Barbiturates, Ur Screen: NEGATIVE (ref ?–200)
Benzodiazepine, Ur Scrn: NEGATIVE (ref ?–200)
Cocaine Metabolite,Ur ~~LOC~~: NEGATIVE (ref ?–300)
Methadone, Ur Screen: NEGATIVE (ref ?–300)
Tricyclic, Ur Screen: NEGATIVE (ref ?–1000)

## 2012-08-05 LAB — URINALYSIS, COMPLETE
Glucose,UR: NEGATIVE mg/dL (ref 0–75)
Ketone: NEGATIVE
Nitrite: NEGATIVE
Ph: 6 (ref 4.5–8.0)
Protein: NEGATIVE
RBC,UR: 1 /HPF (ref 0–5)

## 2012-08-05 LAB — ACETAMINOPHEN LEVEL: Acetaminophen: <2 ug/mL

## 2012-08-06 ENCOUNTER — Ambulatory Visit (HOSPITAL_COMMUNITY): Payer: Self-pay | Admitting: Psychiatry

## 2012-08-06 LAB — BEHAVIORAL MEDICINE 1 PANEL
Albumin: 3.7 g/dL (ref 3.4–5.0)
Alkaline Phosphatase: 75 U/L (ref 50–136)
Anion Gap: 5 — ABNORMAL LOW (ref 7–16)
BUN: 7 mg/dL (ref 7–18)
Basophil #: 0 10*3/uL (ref 0.0–0.1)
Basophil %: 0.5 %
Bilirubin,Total: 1.6 mg/dL — ABNORMAL HIGH (ref 0.2–1.0)
Calcium, Total: 8.4 mg/dL — ABNORMAL LOW (ref 8.5–10.1)
Chloride: 108 mmol/L — ABNORMAL HIGH (ref 98–107)
Co2: 27 mmol/L (ref 21–32)
Creatinine: 0.93 mg/dL (ref 0.60–1.30)
EGFR (African American): >60
EGFR (Non-African Amer.): >60
Eosinophil #: 0.2 10*3/uL (ref 0.0–0.7)
Eosinophil %: 2.6 %
Glucose: 96 mg/dL (ref 65–99)
HCT: 40.9 % (ref 40.0–52.0)
HGB: 14.6 g/dL (ref 13.0–18.0)
Lymphocyte #: 2 10*3/uL (ref 1.0–3.6)
Lymphocyte %: 27.8 %
MCH: 31.3 pg (ref 26.0–34.0)
MCHC: 35.6 g/dL (ref 32.0–36.0)
MCV: 88 fL (ref 80–100)
Monocyte #: 0.6 x10 3/mm (ref 0.2–1.0)
Monocyte %: 8.3 %
Neutrophil #: 4.4 10*3/uL (ref 1.4–6.5)
Neutrophil %: 60.8 %
Osmolality: 277 (ref 275–301)
Platelet: 260 10*3/uL (ref 150–440)
Potassium: 3.7 mmol/L (ref 3.5–5.1)
RBC: 4.65 10*6/uL (ref 4.40–5.90)
RDW: 13.1 % (ref 11.5–14.5)
SGOT(AST): 26 U/L (ref 15–37)
SGPT (ALT): 28 U/L (ref 12–78)
Sodium: 140 mmol/L (ref 136–145)
Thyroid Stimulating Horm: 1.29 u[IU]/mL
Total Protein: 7 g/dL (ref 6.4–8.2)
WBC: 7.2 10*3/uL (ref 3.8–10.6)

## 2012-08-28 ENCOUNTER — Encounter (INDEPENDENT_AMBULATORY_CARE_PROVIDER_SITE_OTHER): Payer: BC Managed Care – PPO

## 2012-09-08 ENCOUNTER — Other Ambulatory Visit (HOSPITAL_COMMUNITY): Payer: Self-pay | Admitting: Psychiatry

## 2012-09-08 DIAGNOSIS — F329 Major depressive disorder, single episode, unspecified: Secondary | ICD-10-CM

## 2012-09-08 MED ORDER — VENLAFAXINE HCL ER 75 MG PO CP24: 75.0000 mg | ORAL_CAPSULE | Freq: Every day | ORAL | Status: DC

## 2012-09-11 ENCOUNTER — Telehealth: Payer: Self-pay | Admitting: Family Medicine

## 2012-09-11 NOTE — Telephone Encounter (Signed)
Form completed without charge but I did state in form that his pscyiaist needs to fill out these questions, not me, as they have been managing and prescribing meds.

## 2012-09-11 NOTE — Telephone Encounter (Signed)
Form is on your desk.

## 2012-09-11 NOTE — Telephone Encounter (Signed)
Jerick Khachatryan dropped off a medical/psychological information authorization to be filled out

## 2012-09-12 NOTE — Telephone Encounter (Signed)
Form faxed back by [facility].

## 2012-09-16 NOTE — Telephone Encounter (Signed)
Patient called wanting to know if the completed form had been faxed back yet? Advised patient that based on the notes in the chart it was faxed back on 09/12/12.

## 2012-09-17 ENCOUNTER — Encounter (INDEPENDENT_AMBULATORY_CARE_PROVIDER_SITE_OTHER): Payer: Self-pay | Admitting: Physician Assistant

## 2012-09-19 ENCOUNTER — Encounter (HOSPITAL_COMMUNITY): Payer: Self-pay | Admitting: Psychiatry

## 2012-09-19 ENCOUNTER — Ambulatory Visit (INDEPENDENT_AMBULATORY_CARE_PROVIDER_SITE_OTHER): Payer: BC Managed Care – PPO | Admitting: Psychiatry

## 2012-09-19 VITALS — BP 112/75 | HR 90 | Ht 65.5 in | Wt 172.0 lb

## 2012-09-19 DIAGNOSIS — F32A Depression, unspecified: Secondary | ICD-10-CM

## 2012-09-19 DIAGNOSIS — F329 Major depressive disorder, single episode, unspecified: Secondary | ICD-10-CM

## 2012-09-19 DIAGNOSIS — F3289 Other specified depressive episodes: Secondary | ICD-10-CM

## 2012-09-19 MED ORDER — VENLAFAXINE HCL ER 37.5 MG PO CP24: 37.5000 mg | ORAL_CAPSULE | Freq: Every day | ORAL | Status: DC

## 2012-09-19 MED ORDER — VENLAFAXINE HCL ER 75 MG PO CP24: 75.0000 mg | ORAL_CAPSULE | Freq: Every day | ORAL | Status: DC

## 2012-09-19 MED ORDER — TRAZODONE HCL 100 MG PO TABS: ORAL_TABLET | ORAL | Status: DC

## 2012-09-19 NOTE — Progress Notes (Signed)
[facility] Follow-up Outpatient Visit  Eric [name] [birth year]  Date: 09/19/2012  Chief Complaint  Patient presents with  . Depression  . Anxiety   History of Chief Complaint:   HPI Comments: Eric [name] is a 37 y/o male with a past psychiatric history significant for Depression, NOS. The patient is referred for psychiatric services for medication management.   He states that he has been getting along better with his wife whom he is now separated from. He reports he continues to exercise daily and has been running with his police dog.  The patient states he is dating again but very conservatively.   He spends his time with his son who he states prefers to stay with him. The patient reports he is taking his medications and denies any side effects.   In the area of affective symptoms, patient appears euthymic. Patient denies current suicidal ideation, intent, or plan. Patient denies current homicidal ideation, intent, or plan. Patient denies auditory hallucinations. Patient denies visual hallucinations. Patient denies symptoms of paranoia. Patient states sleep is fair, with approximately 5 hours of sleep per night, but he has been off trazodone.  Appetite is fair. Energy level is good. Patient reports continued symptoms of anhedonia. Patient denies hopelessness, helplessness, or guilt.   Denies any recent episodes consistent with mania, particularly decreased need for sleep with increased energy, grandiosity, impulsivity, hyperverbal and pressured speech, or increased productivity. Denies any recent symptoms consistent with psychosis, particularly auditory or visual hallucinations, thought broadcasting/insertion/withdrawal, or ideas of reference. Also denies excessive worry to the point of physical symptoms as well as any panic attacks. Denies any history of trauma or symptoms consistent with PTSD such as flashbacks, nightmares, hypervigilance, feelings of numbness or inability to  connect with others.    Review of Systems  Constitutional: Positive for weight loss. Negative for fever, chills and diaphoresis.  Respiratory: Negative for cough, shortness of breath and wheezing.   Cardiovascular: Negative for chest pain, palpitations and leg swelling.  Gastrointestinal: Negative for nausea, vomiting, abdominal pain, diarrhea and constipation.  Musculoskeletal:       No muscle weakness or gait problems  Neurological: Negative for dizziness, tremors and focal weakness.     Filed Vitals:   09/19/12 1117  BP: 112/75  Pulse: 90  Height: 5' 5.5" (1.664 m)  Weight: 172 lb (78.019 kg)   Physical Exam  Vitals reviewed. Constitutional: He appears well-developed and well-nourished. No distress.  Skin: He is not diaphoretic.   Traumatic Brain Injury: No   Past Psychiatric History:Reviewed Diagnosis: Depression, NOS  Hospitalizations: Patient denies.  Outpatient Care: Patient denies.  Substance Abuse Care: Patient denies.  Self-Mutilation: Patient denies.  Suicidal Attempts: Patient denies.  Violent Behaviors: Patient denies.   Past Medical History:  Reviewed Past Medical History  Diagnosis Date  . Overweight   . Depression   . Acid reflux    History of Loss of Consciousness:  No Seizure History:  No Cardiac History:  No  Allergies:  No Known Allergies  Current Medications:Reviewed Current Outpatient Prescriptions on File Prior to Visit  Medication Sig Dispense Refill  . pantoprazole (PROTONIX) 40 MG tablet Take 1 tablet (40 mg total) by mouth daily.  30 tablet  12  . traZODone (DESYREL) 100 MG tablet Take one half to one tablet daily at bedtime.  30 tablet  1  . venlafaxine XR (EFFEXOR-XR) 37.5 MG 24 hr capsule Take 1 capsule (37.5 mg total) by mouth daily. Take with 75 mg  30 capsule  1  . venlafaxine XR (EFFEXOR-XR) 75 MG 24 hr capsule Take 1 capsule (75 mg total) by mouth daily. Take with 37.5 mg  30 capsule  1   No current facility-administered  medications on file prior to visit.    Previous Psychotropic Medications:Reviewed  Medication Dose   Venlafaxine: Currently  75 mg   Alprazolam: currently  0.25 mg   Zolpidem  10 mg   Citalopram:    Fluoxetine    Substance Abuse History in the last 12 months:Reviewed Caffeine: Patient denies. Tobacco: Cigarettes: 3-4 per day. Alcohol:Patient denies. Illicit Drugs: Patient denies.  Medical Consequences of Substance Abuse:N/A  Legal Consequences of Substance Abuse:N/A  Family Consequences of Substance Abuse: N/A  Blackouts:  No DT's:  No Withdrawal Symptoms:  No None  Social History:Reviewed Current Place of Residence: [place], [place] Place of Birth: [place], [place] Family Members: Has an two older brothers, and one older sister: Eric [name], Eric [name]) Marital Status:  Separated Children: 2  Sons: 1  Daughters: 1 Relationships: The patient reports his brother is his main source of emotional support. Education:  HS Graduate Educational Problems/Performance: B's,C's, had problems with spelling Religious Beliefs/Practices: Goes to [employer]. History of Abuse: none Occupational Experiences: Works for the town of [place], [place]. He reports he enjoys his job. Military History:  None. Legal History: None Hobbies/Interests: Plays sports with his son.   Family History:  Reviewed Family History  Problem Relation Age of Onset  . Heart disease Father   . Heart attack Father 61    Mental Status Examination/Evaluation: Objective:  Appearance: Casual and Neat  Eye Contact::  Good  Speech:  Clear and Coherent and Normal Rate  Volume:  Normal  Mood: "good" 9/10  (0=Very depressed; 5=Neutral; 10=Very Happy)    Affect:  Appropriate, Congruent and Full Range  Thought Process:  Coherent, Logical and Loose  Orientation:  Full  Thought Content:  WDL  Suicidal Thoughts:  No  Homicidal Thoughts:  No  Judgement:  Good  Insight:  Good  Psychomotor Activity:  Normal   Akathisia:  Yes  Handed:  Right  Memory: Intact recent and immediate  Assets:  Communication Skills Desire for Improvement Financial Resources/Insurance Housing Intimacy Leisure Time Physical Health Resilience Social Support Talents/Skills Transportation Vocational/Educational    Laboratory/X-Ray Psychological Evaluation(s)  None  None   Assessment:   AXIS I Depressive Disorder NOS  AXIS II No diagnosis  AXIS III Past Medical History  Diagnosis Date  . Overweight   . Depression   . Acid reflux      AXIS IV economic problems, educational problems, housing problems, occupational problems, other psychosocial or environmental problems, problems related to legal system/crime, problems related to social environment, problems with access to health care services and problems with primary support group  AXIS V 51-60 moderate symptoms   Treatment Plan/Recommendations:  PLAN:  1. Affirm with the patient that the medications are taken as ordered. Patient expressed understanding of how their medications were to be used.  2. Continue the following psychiatric medications as written prior to this appointment with the following changes:  a) Continue venlafaxine to  112.5 mg- 75 mg and 37.5 for continued depressive symptoms. B) IncreaseTrazodone 100 mg, one to one and a half tablets daily. 3. Therapy: brief supportive therapy provided. Discussed psychosocial stressors in detail. Completed evaluation paperwork for work release on his current medications. 4. Risks and benefits, side effects and alternatives discussed with patient, he was given an opportunity to ask questions  about his/her medication, illness, and treatment. All current psychiatric medications have been reviewed and discussed with the patient and adjusted as clinically appropriate. The patient has been provided an accurate and updated list of the medications being now prescribed.  5. Patient told to call clinic if any  problems occur. Patient advised to go to ER  if he should develop SI/HI, side effects, or if symptoms worsen. Has crisis numbers to call if needed.   6. No labs warranted at this time.  7. The patient was encouraged to keep all PCP and specialty clinic appointments.  8. Patient was instructed to return to clinic in 1 month.  9. The patient was advised to call and cancel their mental health appointment within 24 hours of the appointment, if they are unable to keep the appointment, as well as the three no show and termination from clinic policy. 10. The patient expressed understanding of the plan and agrees with the above.  Jacqulyn Cane, M.D.  09/19/2012 11:22 AM

## 2012-11-25 ENCOUNTER — Ambulatory Visit: Payer: Self-pay | Admitting: Family Medicine

## 2012-12-17 ENCOUNTER — Emergency Department (HOSPITAL_COMMUNITY)
Admission: EM | Admit: 2012-12-17 | Discharge: 2012-12-18 | Disposition: A | Discharge status: Other Acute Inpatient Hospital | Payer: BC Managed Care – PPO | Attending: Emergency Medicine | Admitting: Emergency Medicine

## 2012-12-17 ENCOUNTER — Ambulatory Visit (INDEPENDENT_AMBULATORY_CARE_PROVIDER_SITE_OTHER): Payer: BC Managed Care – PPO | Admitting: Psychiatry

## 2012-12-17 ENCOUNTER — Encounter (HOSPITAL_COMMUNITY): Payer: Self-pay | Admitting: Emergency Medicine

## 2012-12-17 ENCOUNTER — Encounter (HOSPITAL_COMMUNITY): Payer: Self-pay | Admitting: Psychiatry

## 2012-12-17 ENCOUNTER — Telehealth (HOSPITAL_COMMUNITY): Payer: Self-pay | Admitting: Psychiatry

## 2012-12-17 VITALS — BP 115/76 | HR 98 | Ht 65.5 in | Wt 184.0 lb

## 2012-12-17 DIAGNOSIS — R45851 Suicidal ideations: Secondary | ICD-10-CM | POA: Insufficient documentation

## 2012-12-17 DIAGNOSIS — Z79899 Other long term (current) drug therapy: Secondary | ICD-10-CM | POA: Insufficient documentation

## 2012-12-17 DIAGNOSIS — Z862 Personal history of diseases of the blood and blood-forming organs and certain disorders involving the immune mechanism: Secondary | ICD-10-CM | POA: Insufficient documentation

## 2012-12-17 DIAGNOSIS — F329 Major depressive disorder, single episode, unspecified: Secondary | ICD-10-CM

## 2012-12-17 DIAGNOSIS — F3289 Other specified depressive episodes: Secondary | ICD-10-CM | POA: Insufficient documentation

## 2012-12-17 DIAGNOSIS — F332 Major depressive disorder, recurrent severe without psychotic features: Secondary | ICD-10-CM

## 2012-12-17 DIAGNOSIS — K219 Gastro-esophageal reflux disease without esophagitis: Secondary | ICD-10-CM | POA: Insufficient documentation

## 2012-12-17 DIAGNOSIS — Z8639 Personal history of other endocrine, nutritional and metabolic disease: Secondary | ICD-10-CM | POA: Insufficient documentation

## 2012-12-17 LAB — COMPREHENSIVE METABOLIC PANEL
Albumin: 4 g/dL (ref 3.5–5.2)
Alkaline Phosphatase: 53 U/L (ref 39–117)
BUN: 14 mg/dL (ref 6–23)
Calcium: 9.4 mg/dL (ref 8.4–10.5)
GFR calc non Af Amer: 81 mL/min — ABNORMAL LOW (ref >90)
Glucose, Bld: 117 mg/dL — ABNORMAL HIGH (ref 70–99)
Potassium: 4.1 mEq/L (ref 3.5–5.1)
Sodium: 137 mEq/L (ref 135–145)
Total Protein: 7.3 g/dL (ref 6.0–8.3)

## 2012-12-17 LAB — CBC
MCH: 31.5 pg (ref 26.0–34.0)
MCHC: 36 g/dL (ref 30.0–36.0)
RDW: 12.2 % (ref 11.5–15.5)

## 2012-12-17 LAB — SALICYLATE LEVEL: Salicylate Lvl: <2 mg/dL — ABNORMAL LOW (ref 2.8–20.0)

## 2012-12-17 LAB — RAPID URINE DRUG SCREEN, HOSP PERFORMED
Amphetamines: NOT DETECTED
Tetrahydrocannabinol: NOT DETECTED

## 2012-12-17 LAB — ACETAMINOPHEN LEVEL: Acetaminophen (Tylenol), Serum: <15 ug/mL (ref 10–30)

## 2012-12-17 MED ORDER — PANTOPRAZOLE SODIUM 40 MG PO TBEC
40.0000 mg | DELAYED_RELEASE_TABLET | Freq: Every day | ORAL | Status: DC
  Administered 2012-12-17 – 2012-12-18 (×2): 40 mg via ORAL
  Filled 2012-12-17 (×2): qty 1

## 2012-12-17 MED ORDER — VENLAFAXINE HCL ER 75 MG PO CP24
75.0000 mg | ORAL_CAPSULE | Freq: Every day | ORAL | Status: DC
  Administered 2012-12-17 – 2012-12-18 (×2): 75 mg via ORAL
  Filled 2012-12-17 (×2): qty 1

## 2012-12-17 MED ORDER — VENLAFAXINE HCL ER 37.5 MG PO CP24: 37.5000 mg | ORAL_CAPSULE | Freq: Every day | ORAL | Status: DC

## 2012-12-17 MED ORDER — IBUPROFEN 200 MG PO TABS: 600.0000 mg | ORAL_TABLET | Freq: Three times a day (TID) | ORAL | Status: DC | PRN

## 2012-12-17 MED ORDER — VENLAFAXINE HCL ER 37.5 MG PO CP24
37.5000 mg | ORAL_CAPSULE | Freq: Every day | ORAL | Status: DC
  Administered 2012-12-17 – 2012-12-18 (×2): 37.5 mg via ORAL
  Filled 2012-12-17 (×2): qty 1

## 2012-12-17 MED ORDER — ONDANSETRON HCL 4 MG PO TABS: 4.0000 mg | ORAL_TABLET | Freq: Three times a day (TID) | ORAL | Status: DC | PRN

## 2012-12-17 MED ORDER — TRAZODONE HCL 50 MG PO TABS
50.0000 mg | ORAL_TABLET | Freq: Every day | ORAL | Status: DC
  Administered 2012-12-17: 50 mg via ORAL
  Filled 2012-12-17: qty 1

## 2012-12-17 MED ORDER — ZOLPIDEM TARTRATE 5 MG PO TABS: 5.0000 mg | ORAL_TABLET | Freq: Every evening | ORAL | Status: DC | PRN

## 2012-12-17 MED ORDER — ACETAMINOPHEN 325 MG PO TABS: 650.0000 mg | ORAL_TABLET | ORAL | Status: DC | PRN

## 2012-12-17 MED ORDER — LORAZEPAM 1 MG PO TABS: 1.0000 mg | ORAL_TABLET | Freq: Three times a day (TID) | ORAL | Status: DC | PRN

## 2012-12-17 MED ORDER — GI COCKTAIL ~~LOC~~
30.0000 mL | Freq: Once | ORAL | Status: AC
  Administered 2012-12-17: 30 mL via ORAL
  Filled 2012-12-17: qty 30

## 2012-12-17 MED ORDER — NICOTINE 21 MG/24HR TD PT24: 21.0000 mg | MEDICATED_PATCH | Freq: Every day | TRANSDERMAL | Status: DC

## 2012-12-17 MED ORDER — TRAZODONE HCL 50 MG PO TABS: 50.0000 mg | ORAL_TABLET | Freq: Every evening | ORAL | Status: DC | PRN

## 2012-12-17 NOTE — Progress Notes (Signed)
[facility] Follow-up Outpatient Visit  GLENMORE KARL Jan 17, 1976  Date: 12/17/2012  Chief Complaint  Patient presents with  . Depression  . Anxiety   History of Chief Complaint:   HPI Comments: Mr. Luft is a 37 y/o male with a past psychiatric history significant for Depression, NOS. The patient is referred for psychiatric services for medication management.   The patient reports that he and his wife are now divorced as of the 29th of July.  He reports his suicidal thoughts have increased over the past few days, and is here today as he fears he is not able to control the thougts. He states that the fear that his 53 y/o son (who is currently staying with his grandparents now)  Would find him dead was the only reason he has not killed himself. He had been admitted to [facility] in May 2014, after an attempt to overdose. He states he had stopped his medication (effexor) due to cost.   He has been having difficulty with the divorce. He reports he had stopped his medications due to insurance problems.  The patient reports he is taking his medications and denies any side effects.   In the area of affective symptoms, patient appears depressed. Patient denies current suicidal intent, or plan, but has been reported suicidal thoughts with plans in the past week. He states th Patient denies current homicidal ideation, intent, or plan. Patient denies auditory hallucinations. Patient denies visual hallucinations. Patient denies symptoms of paranoia. Patient states sleep is fair, with approximately 4 hours of broken sleep per night, but he has been off trazodone.  Appetite is fair. Energy level is poor. Patient reports continued symptoms of anhedonia. Patient denies hopelessness, helplessness, or guilt.   Denies any recent episodes consistent with mania, particularly decreased need for sleep with increased energy, grandiosity, impulsivity, hyperverbal and pressured speech, or increased  productivity. Denies any recent symptoms consistent with psychosis, particularly auditory or visual hallucinations, thought broadcasting/insertion/withdrawal, or ideas of reference. Also denies excessive worry to the point of physical symptoms as well as any panic attacks. Denies any history of trauma or symptoms consistent with PTSD such as flashbacks, nightmares, hypervigilance, feelings of numbness or inability to connect with others.    Review of Systems  Constitutional: Positive for weight loss. Negative for fever, chills and diaphoresis.  Respiratory: Negative for cough, shortness of breath and wheezing.   Cardiovascular: Negative for chest pain, palpitations and leg swelling.  Gastrointestinal: Negative for nausea, vomiting, abdominal pain, diarrhea and constipation.  Musculoskeletal:       No muscle weakness or gait problems  Neurological: Negative for dizziness (Since stopping medication.), tremors and focal weakness.     Filed Vitals:   12/17/12 1350  BP: 115/76  Pulse: 98  Height: 5' 5.5" (1.664 m)  Weight: 184 lb (83.462 kg)   Physical Exam  Vitals reviewed. Constitutional: He appears well-developed and well-nourished. No distress.  Skin: He is not diaphoretic.  Musculoskeletal: Strength & Muscle Tone: within normal limits Gait & Station: normal Patient leans: N/A   Traumatic Brain Injury: No   Past Psychiatric History:Reviewed Diagnosis: Depression, NOS  Hospitalizations: Patient denies.  Outpatient Care: Patient denies.  Substance Abuse Care: Patient denies.  Self-Mutilation: Patient denies.  Suicidal Attempts: Patient denies.  Violent Behaviors: Patient denies.   Past Medical History:  Reviewed Past Medical History  Diagnosis Date  . Overweight   . Depression   . Acid reflux    History of Loss of Consciousness:  No Seizure History:  No Cardiac History:  No  Allergies:  No Known Allergies  Current Medications:Reviewed Current Outpatient  Prescriptions on File Prior to Visit  Medication Sig Dispense Refill  . pantoprazole (PROTONIX) 40 MG tablet Take 1 tablet (40 mg total) by mouth daily.  30 tablet  12  . traZODone (DESYREL) 100 MG tablet Take one half to one tablet daily at bedtime.  30 tablet  1  . venlafaxine XR (EFFEXOR-XR) 37.5 MG 24 hr capsule Take 1 capsule (37.5 mg total) by mouth daily. Take with 75 mg  30 capsule  1  . venlafaxine XR (EFFEXOR-XR) 75 MG 24 hr capsule Take 1 capsule (75 mg total) by mouth daily. Take with 37.5 mg  30 capsule  1   No current facility-administered medications on file prior to visit.    Previous Psychotropic Medications:Reviewed  Medication Dose   Venlafaxine: Currently  75 mg   Alprazolam: currently  0.25 mg   Zolpidem  10 mg   Citalopram:    Fluoxetine    Substance Abuse History in the last 12 months:Reviewed Caffeine: Patient denies. Tobacco: Cigarettes: 3-4 per day. Alcohol:Patient denies. Illicit Drugs: Patient denies.  Medical Consequences of Substance Abuse:N/A  Legal Consequences of Substance Abuse:N/A  Family Consequences of Substance Abuse: N/A  Blackouts:  No DT's:  No Withdrawal Symptoms:  No None  Social History:Reviewed Current Place of Residence: [place], [place] Place of Birth: [place], [place] Family Members: Has an two older brothers, and one older sister: Theodoro Grist, Johny Chess) Marital Status:  Separated Children: 2  Sons: 1  Daughters: 1 Relationships: The patient reports his brother is his main source of emotional support. Education:  HS Graduate Educational Problems/Performance: B's,C's, had problems with spelling Religious Beliefs/Practices: Goes to [employer]. History of Abuse: none Occupational Experiences: Works for the town of [place], [place]. He reports he enjoys his job. Military History:  None. Legal History: None Hobbies/Interests: Plays sports with his son.   Family History:  Reviewed Family History  Problem Relation Age of  Onset  . Heart disease Father   . Heart attack Father 2    Mental Status Examination/Evaluation: Objective:  Appearance: Casual and Neat  Eye Contact::  Good  Speech:  Clear and Coherent and Normal Rate  Volume:  Normal  Mood: "it not good" -2/10  (0=Very depressed; 5=Neutral; 10=Very Happy)    Affect:  Appropriate, Congruent and Full Range  Thought Process:  Coherent, Logical and Loose  Orientation:  Full  Thought Content:  WDL  Suicidal Thoughts:  Yes-patient is willing to go to the ER for Assessment  Homicidal Thoughts:  No  Judgement:  Good  Insight:  Good  Psychomotor Activity:  Normal  Akathisia:  Yes  Handed:  Right  Memory: Intact immediate 3/3; recent 2/3  Assets:  Communication Skills Desire for Improvement Financial Resources/Insurance Housing Intimacy Leisure Time Physical Health Resilience Social Support Talents/Skills Transportation Vocational/Educational    Laboratory/X-Ray Psychological Evaluation(s)  None  None   Assessment:   AXIS I Major Depressive disorder, recurrent, severe, without mention of psychotic behavior   AXIS II No diagnosis  AXIS III Past Medical History  Diagnosis Date  . Overweight   . Depression   . Acid reflux      AXIS IV economic problems, educational problems, housing problems, occupational problems, other psychosocial or environmental problems, problems related to legal system/crime, problems related to social environment, problems with access to health care services and problems with primary support group  AXIS V  20130   Treatment Plan/Recommendations:  PLAN:  1. Patient is willing to go to the ER for assessment for inpatient admission. 2. Recommend the following medicaitons a) Restart Venlafaxine 75 mg B) Continue Trazodone 100 mg, one to one and a half tablets QHS for insomnia C) Start Lithium 150 mg, titrate to 600 mg or higher while inpatient.  Jacqulyn Cane, M.D.  12/17/2012 1:47 PM

## 2012-12-17 NOTE — ED Provider Notes (Signed)
CSN: [id number]     Arrival date & time 12/17/12  1528 History    This chart was scribed for Eric Mutton, PA working with Flint Melter, MD by Quintella Reichert, ED Scribe. This patient was seen in room WTR4/WLPT4 and the patient's care was started at 5:50 PM.     Chief Complaint  Patient presents with  . Suicidal    The history is provided by the patient and medical records. No language interpreter was used.    HPI Comments: Eric [name] is a 37 y.o. male with h/o depression, gastric banding who presents to the Emergency Department, with brother, complaining of progressively-worsening depression with 2 weeks of associated SI.  Pt states that he medicates regularly with anti-depressants but due to a "lax in my insurance" he has not been taking these medications for 3 weeks.  He is also going through a divorce with his wife.  Pt was in his psychiatrist's office today and was advised that "it was in my best interest to come to the ED" due to SI thoughts that he was expressing or he would be involuntarily committed.  Presently he denies a plan to hurt himself.  However according to triage note he admitted to thoughts of either shooting or hanging himself and stated that he has access to a gun.  He denies HI, auditory or visual hallucinations, CP, SOB, cough, congestion, insomnia, decreased concentration, or loss of interest.  Pt has been seeing a psychiatrist for the past 6 months.  He uses chewing tobacco.  He denies alcohol or illicit drug use.  PCP is Dr. Dayton Martes    Past Medical History  Diagnosis Date  . Overweight(278.02)   . Depression   . Acid reflux     Past Surgical History  Procedure Laterality Date  . Appendectomy  1988  . Esophagogastroduodenoscopy  05/26/2004    Dr. Arlyce Dice  . Laparoscopic gastric banding  08/2010    Family History  Problem Relation Age of Onset  . Heart disease Father   . Heart attack Father 34  . Diabetes Mellitus II Father   . Diabetes Mellitus II  Mother   . Hepatitis B Mother     History  Substance Use Topics  . Smoking status: Never Smoker   . Smokeless tobacco: Current User    Types: Chew     Comment: Pouches-3-4 pouches/day for 6-8 years-in the process of quitting  . Alcohol Use: Yes     Comment: Twice a year.     Review of Systems  HENT: Negative for congestion.   Respiratory: Negative for cough and shortness of breath.   Cardiovascular: Negative for chest pain.  Psychiatric/Behavioral: Positive for suicidal ideas and dysphoric mood. Negative for hallucinations, sleep disturbance and decreased concentration.  All other systems reviewed and are negative.      Allergies  Review of patient's allergies indicates no known allergies.  Home Medications   Current Outpatient Rx  Name  Route  Sig  Dispense  Refill  . pantoprazole (PROTONIX) 40 MG tablet   Oral   Take 1 tablet (40 mg total) by mouth daily.   30 tablet   12   . traZODone (DESYREL) 100 MG tablet   Oral   Take 50-100 mg by mouth at bedtime.         Marland Kitchen venlafaxine XR (EFFEXOR-XR) 37.5 MG 24 hr capsule   Oral   Take 1 capsule (37.5 mg total) by mouth daily. Take with 75 mg  30 capsule   1   . venlafaxine XR (EFFEXOR-XR) 75 MG 24 hr capsule   Oral   Take 1 capsule (75 mg total) by mouth daily. Take with 37.5 mg   30 capsule   1    BP 122/73  Pulse 82  Temp(Src) 98.5 F (36.9 C) (Oral)  Resp 16  SpO2 100%  Physical Exam  Nursing note and vitals reviewed. Constitutional: He is oriented to person, place, and time. He appears well-developed and well-nourished. No distress.  HENT:  Head: Normocephalic and atraumatic.  Mouth/Throat: Oropharynx is clear and moist. No oropharyngeal exudate.  Eyes: Conjunctivae and EOM are normal. Pupils are equal, round, and reactive to light. Right eye exhibits no discharge. Left eye exhibits no discharge.  Neck: Normal range of motion. Neck supple. No tracheal deviation present.  Negative neck  stiffness Negative nuchal rigidity Negative cervical spine tenderness Negative LAD  Cardiovascular: Normal rate and regular rhythm.  Exam reveals no friction rub.   No murmur heard. Pulmonary/Chest: Effort normal and breath sounds normal. No respiratory distress. He has no wheezes. He has no rales.  Abdominal: Soft. Bowel sounds are normal. He exhibits no distension. There is no tenderness. There is no rebound and no guarding.  Has a port from lap band surgery  Musculoskeletal: Normal range of motion.  Neurological: He is alert and oriented to person, place, and time. No cranial nerve deficit. He exhibits normal muscle tone. Coordination normal.  Strength 5+/5+ with radiation Cranial nerves III-XII grossly intact    Skin: Skin is warm and dry.  No lesions, wounds, lacerations or signs of self-injury noted   Psychiatric:  Flat affect Appeared agitated    ED Course  Procedures (including critical care time)  DIAGNOSTIC STUDIES: Oxygen Saturation is 100% on room air, normal by my interpretation.    COORDINATION OF CARE: 5:58 PM-Discussed treatment plan which includes transfer to psych ED with pt at bedside and pt agreed to plan.    Labs Reviewed  COMPREHENSIVE METABOLIC PANEL - Abnormal; Notable for the following:    Glucose, Bld 117 (*)    GFR calc non Af Amer 81 (*)    All other components within normal limits  SALICYLATE LEVEL - Abnormal; Notable for the following:    Salicylate Lvl <2.0 (*)    All other components within normal limits  ACETAMINOPHEN LEVEL  CBC  ETHANOL  URINE RAPID DRUG SCREEN (HOSP PERFORMED)   No results found.  No diagnosis found.   MDM  I personally performed the services described in this documentation, which was scribed in my presence. The recorded information has been reviewed and is accurate.  Patient presenting to the ED with increased depression and suicidal ideation. When asked about plan patient denied, based on triage note patient had  access to gun and had thoughts of hanging himself. Was seen by psych doctor prior to ED and was sent to the ED, recommendation by psych doctor. Denied HI, hallucinations. Denied alcohol abuse. Denied illicit drug use. Reported that he chews tobacco.  Patient medically cleared. Psych orders placed. ACT performed. Telepsych ordered. Patient moved to psych ED. Discussed case with Dr. Berlinda Last at change in shift - transfer of care to Dr. Berlinda Last.    Eric Mutton, PA-C 12/18/12 [phone]

## 2012-12-17 NOTE — ED Notes (Signed)
md called and reported pt coming in for increasing SI thoughts. Pt has access to gun, thoughts to either shoot or hang himself.

## 2012-12-18 DIAGNOSIS — F39 Unspecified mood [affective] disorder: Secondary | ICD-10-CM

## 2012-12-18 NOTE — ED Provider Notes (Signed)
Medical screening examination/treatment/procedure(s) were performed by non-physician practitioner and as supervising physician I was immediately available for consultation/collaboration.  Rennie Rouch L Terrace Chiem, MD 12/18/12 1404 

## 2012-12-18 NOTE — Consult Note (Signed)
[facility] Psychiatry Consult   Reason for Consult:  Eval for IP Psychiatric Mgmt Referring Physician:  [facility] EDP  Eric [name] is an 37 y.o. male.  Assessment: AXIS I:  Mood Disorder NOS AXIS II:  No diagnosis AXIS III:   Past Medical History  Diagnosis Date  . Overweight(278.02)   . Depression   . Acid reflux    AXIS IV:  other psychosocial or environmental problems AXIS V:  11-20 some danger of hurting self or others possible OR occasionally fails to maintain minimal personal hygiene OR gross impairment in communication  Plan:  Recommend psychiatric Inpatient admission when medically cleared.  Subjective:   Eric [name] is a 37 y.o. male patient presenting voluntarily with concerns x 2 weeks duration of worsening depression sx. Patient has a hx of depression and has been off his medications x one month due to a lapse in his insurance.. Patient rates his depression a 8/10 and endorses SI with plan to hang or shoot himself. Patient denies HI or AVH but cannot contract for safety at this present time. Patient denies any use of alcohol and or illicit drugs.  HPI:   HPI Elements:     Past Psychiatric History: Past Medical History  Diagnosis Date  . Overweight(278.02)   . Depression   . Acid reflux     reports that he has never smoked. His smokeless tobacco use includes Chew. He reports that  drinks alcohol. He reports that he does not use illicit drugs. Family History  Problem Relation Age of Onset  . Heart disease Father   . Heart attack Father 60  . Diabetes Mellitus II Father   . Diabetes Mellitus II Mother   . Hepatitis B Mother            Allergies:  No Known Allergies  Past Psychiatric History: Diagnosis:  mood  Hospitalizations:  no  Outpatient Care:  yes  Substance Abuse Care:  no  Self-Mutilation:  no  Suicidal Attempts:  no  Violent Behaviors:  no   Objective: Blood pressure 102/66, pulse 71, temperature 97.4 F (36.3 C), temperature source Oral, resp.  rate 16, SpO2 99.00%.There is no weight on file to calculate BMI. Results for orders placed during the hospital encounter of 12/17/12 (from the past 72 hour(s))  URINE RAPID DRUG SCREEN (HOSP PERFORMED)     Status: None   Collection Time    12/17/12  5:04 PM      Result Value Range   Opiates NONE DETECTED  NONE DETECTED   Cocaine NONE DETECTED  NONE DETECTED   Benzodiazepines NONE DETECTED  NONE DETECTED   Amphetamines NONE DETECTED  NONE DETECTED   Tetrahydrocannabinol NONE DETECTED  NONE DETECTED   Barbiturates NONE DETECTED  NONE DETECTED   Comment:            DRUG SCREEN FOR MEDICAL PURPOSES     ONLY.  IF CONFIRMATION IS NEEDED     FOR ANY PURPOSE, NOTIFY LAB     WITHIN 5 DAYS.                LOWEST DETECTABLE LIMITS     FOR URINE DRUG SCREEN     Drug Class       Cutoff (ng/mL)     Amphetamine      1000     Barbiturate      200     Benzodiazepine   200     Tricyclics  300     Opiates          300     Cocaine          300     THC              50  ACETAMINOPHEN LEVEL     Status: None   Collection Time    12/17/12  5:32 PM      Result Value Range   Acetaminophen (Tylenol), Serum <15.0  10 - 30 ug/mL   Comment:            THERAPEUTIC CONCENTRATIONS VARY     SIGNIFICANTLY. A RANGE OF 10-30     ug/mL MAY BE AN EFFECTIVE     CONCENTRATION FOR MANY PATIENTS.     HOWEVER, SOME ARE BEST TREATED     AT CONCENTRATIONS OUTSIDE THIS     RANGE.     ACETAMINOPHEN CONCENTRATIONS     >150 ug/mL AT 4 HOURS AFTER     INGESTION AND >50 ug/mL AT 12     HOURS AFTER INGESTION ARE     OFTEN ASSOCIATED WITH TOXIC     REACTIONS.  CBC     Status: None   Collection Time    12/17/12  5:32 PM      Result Value Range   WBC 8.4  4.0 - 10.5 K/uL   RBC 4.76  4.22 - 5.81 MIL/uL   Hemoglobin 15.0  13.0 - 17.0 g/dL   HCT 16.1  09.6 - 04.5 %   MCV 87.6  78.0 - 100.0 fL   MCH 31.5  26.0 - 34.0 pg   MCHC 36.0  30.0 - 36.0 g/dL   RDW 40.9  81.1 - 91.4 %   Platelets 243  150 - 400 K/uL   COMPREHENSIVE METABOLIC PANEL     Status: Abnormal   Collection Time    12/17/12  5:32 PM      Result Value Range   Sodium 137  135 - 145 mEq/L   Potassium 4.1  3.5 - 5.1 mEq/L   Chloride 101  96 - 112 mEq/L   CO2 30  19 - 32 mEq/L   Glucose, Bld 117 (*) 70 - 99 mg/dL   BUN 14  6 - 23 mg/dL   Creatinine, Ser 7.82  0.50 - 1.35 mg/dL   Calcium 9.4  8.4 - 95.6 mg/dL   Total Protein 7.3  6.0 - 8.3 g/dL   Albumin 4.0  3.5 - 5.2 g/dL   AST 21  0 - 37 U/L   ALT 13  0 - 53 U/L   Alkaline Phosphatase 53  39 - 117 U/L   Total Bilirubin 1.0  0.3 - 1.2 mg/dL   GFR calc non Af Amer 81 (*) >90 mL/min   GFR calc Af Amer >90  >90 mL/min   Comment:            The eGFR has been calculated     using the CKD EPI equation.     This calculation has not been     validated in all clinical     situations.     eGFR's persistently     <90 mL/min signify     possible Chronic Kidney Disease.  ETHANOL     Status: None   Collection Time    12/17/12  5:32 PM      Result Value Range   Alcohol, Ethyl (B) <11  0 - 11 mg/dL  Comment:            LOWEST DETECTABLE LIMIT FOR     SERUM ALCOHOL IS 11 mg/dL     FOR MEDICAL PURPOSES ONLY  SALICYLATE LEVEL     Status: Abnormal   Collection Time    12/17/12  5:32 PM      Result Value Range   Salicylate Lvl <2.0 (*) 2.8 - 20.0 mg/dL   Labs are reviewed and are pertinent for (No critical lab values noted).  Current Facility-Administered Medications  Medication Dose Route Frequency Provider Last Rate Last Dose  . acetaminophen (TYLENOL) tablet 650 mg  650 mg Oral Q4H PRN Marissa Sciacca, PA-C      . ibuprofen (ADVIL,MOTRIN) tablet 600 mg  600 mg Oral Q8H PRN Marissa Sciacca, PA-C      . LORazepam (ATIVAN) tablet 1 mg  1 mg Oral Q8H PRN Marissa Sciacca, PA-C      . nicotine (NICODERM CQ - dosed in mg/24 hours) patch 21 mg  21 mg Transdermal Daily Marissa Sciacca, PA-C      . ondansetron (ZOFRAN) tablet 4 mg  4 mg Oral Q8H PRN Marissa Sciacca, PA-C      .  pantoprazole (PROTONIX) EC tablet 40 mg  40 mg Oral Daily Marissa Sciacca, PA-C   40 mg at 12/17/12 1834  . traZODone (DESYREL) tablet 50 mg  50 mg Oral QHS,MR X 1 Spencer E Simon, PA-C      . venlafaxine XR (EFFEXOR-XR) 24 hr capsule 37.5 mg  37.5 mg Oral Daily Marissa Sciacca, PA-C   37.5 mg at 12/17/12 2014   And  . venlafaxine XR (EFFEXOR-XR) 24 hr capsule 75 mg  75 mg Oral Daily Marissa Sciacca, PA-C   75 mg at 12/17/12 2012  . zolpidem (AMBIEN) tablet 5 mg  5 mg Oral QHS PRN Raymon Mutton, PA-C       Current Outpatient Prescriptions  Medication Sig Dispense Refill  . pantoprazole (PROTONIX) 40 MG tablet Take 1 tablet (40 mg total) by mouth daily.  30 tablet  12  . traZODone (DESYREL) 100 MG tablet Take 50-100 mg by mouth at bedtime.      Marland Kitchen venlafaxine XR (EFFEXOR-XR) 37.5 MG 24 hr capsule Take 1 capsule (37.5 mg total) by mouth daily. Take with 75 mg  30 capsule  1  . venlafaxine XR (EFFEXOR-XR) 75 MG 24 hr capsule Take 1 capsule (75 mg total) by mouth daily. Take with 37.5 mg  30 capsule  1    Psychiatric Specialty Exam:     Blood pressure 102/66, pulse 71, temperature 97.4 F (36.3 C), temperature source Oral, resp. rate 16, SpO2 99.00%.There is no weight on file to calculate BMI.  General Appearance: Casual  Eye Contact::  Fair  Speech:  Normal Rate  Volume:  Normal  Mood:  Depressed  Affect:  Congruent  Thought Process:  Circumstantial  Orientation:  Full (Time, Place, and Person)  Thought Content:  NA  Suicidal Thoughts:  Yes.  with intent/plan  Homicidal Thoughts:  No  Memory:  Immediate;   Good  Judgement:  Fair  Insight:  Good  Psychomotor Activity:  NA  Concentration:  Good  Recall:  Good  Akathisia:  Negative  Handed:  Right  AIMS (if indicated):     Assets:  Desire for Improvement  Sleep:      Treatment Plan Summary: 1) Admit to [facility] 500 hall pending bed for crises mgmt, safety and stabilization of MDD with SI 2) Mgmt  of co-morbid conditions 3) Social  work to aid in OP support services and psychiatric care to decrease chance of relapse 4) Administration of psychotropics and psychotherapuetic interventions  SIMON,SPENCER E 12/18/2012 1:35 AM  Face to face and consult with Dr. Lucianne Muss Patient feeling overwhelmed, off of medication, and suicidal thoughts.  Agree with previous recommendation for inpatient treatment.  Shuvon B. Rankin FNP-BC Family Nurse Practitioner, Board Certified

## 2012-12-18 NOTE — BH Assessment (Signed)
[facility] Assessment Progress Note      Faxed referrals to [facility], [facility], and [facility]

## 2012-12-18 NOTE — Progress Notes (Addendum)
CSW spoke with Jill Alexanders, patient accepted to [facility]  By Dr. Jessy Oto. RN can call report to [phone], and is assigned bed 109. Patient requested to be transferred by ptar voluntarily. CSW and pt discussed ptar would bill insurance and aware of the possibility that some cost may not be covered. CSW called ptar.   Catha Gosselin, LCSW [phone]  ED CSW .12/18/2012 8:23am

## 2012-12-18 NOTE — BH Assessment (Signed)
Assessment Note  Eric [name] is an 37 y.o. male who presents to [facility] with worsening derpession and recent suicidal ideation.  He reports that he is overwhelmed and is going through a divorce and has just moved out of his home.  He states that he feels like it would be easier not to be here.  On assessment, he denies that he has thought of how he would kill himself, just states that he wished he would die, but told PA Donell Sievert that he has thought of hanging himself or shooting himself.  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  He denies HI or AVH.  He endorses decreased sleep, approximately 3 hours nightly, tearfulness, irritability, increased time in bed, lack of interest, isolating behavior, decreased concentration, and feelings of worthlessness and guilt.  He is unable to contract for safety and is appropriate for inpatient admission.  Axis I: Major Depression, Recurrent severe Axis II: Deferred Axis III:  Past Medical History  Diagnosis Date  . Overweight(278.02)   . Depression   . Acid reflux    Axis IV: housing problems and problems with primary support group Axis V: 21-30 behavior considerably influenced by delusions or hallucinations OR serious impairment in judgment, communication OR inability to function in almost all areas  Past Medical History:  Past Medical History  Diagnosis Date  . Overweight(278.02)   . Depression   . Acid reflux     Past Surgical History  Procedure Laterality Date  . Appendectomy  1988  . Esophagogastroduodenoscopy  05/26/2004    Dr. Arlyce Dice  . Laparoscopic gastric banding  08/2010    Family History:  Family History  Problem Relation Age of Onset  . Heart disease Father   . Heart attack Father 31  . Diabetes Mellitus II Father   . Diabetes Mellitus II Mother   . Hepatitis B Mother     Social History:  reports that he has never smoked. His smokeless tobacco use includes Chew. He reports that  drinks  alcohol. He reports that he does not use illicit drugs.  Additional Social History:  Alcohol / Drug Use History of alcohol / drug use?: No history of alcohol / drug abuse  CIWA: CIWA-Ar BP: 116/76 mmHg Pulse Rate: 70 COWS:    Allergies: No Known Allergies  Home Medications:  (Not in a hospital admission)  OB/GYN Status:  No LMP for male patient.  General Assessment Data Location of Assessment: [facility] ED Is this a Tele or Face-to-Face Assessment?: Face-to-Face Is this an Initial Assessment or a Re-assessment for this encounter?: Initial Assessment Living Arrangements: Other relatives;Non-relatives/Friends (brother, roommate, son and daughter sometimes-16, 67) Can pt return to current living arrangement?: Yes Admission Status: Voluntary Is patient capable of signing voluntary admission?: Yes Transfer from: Acute Hospital Referral Source: Self/Family/Friend  Education Status Is patient currently in school?: No Highest grade of school patient has completed: 12  Risk to self Suicidal Ideation: Yes-Currently Present Suicidal Intent: No-Not Currently/Within Last 6 Months Is patient at risk for suicide?: Yes Suicidal Plan?: Yes-Currently Present Specify Current Suicidal Plan: hang self or shoot self Access to Means: Yes Specify Access to Suicidal Means: firearm What has been your use of drugs/alcohol within the last 12 months?: denies Previous Attempts/Gestures: No Intentional Self Injurious Behavior: None Family Suicide History: Yes (brother made a suicide attempt in past) Recent stressful life event(s): Divorce (getting a divorce, moved out) Persecutory voices/beliefs?: No Depression: Yes Depression Symptoms:  Despondent;Insomnia;Isolating;Tearfulness;Fatigue;Guilt;Loss of interest in usual pleasures;Feeling angry/irritable;Feeling worthless/self pity Substance abuse history and/or treatment for substance abuse?: No Suicide prevention information given to non-admitted patients:  Not applicable  Risk to Others Homicidal Ideation: No Thoughts of Harm to Others: No Current Homicidal Intent: No Current Homicidal Plan: No Access to Homicidal Means: No History of harm to others?: No Assessment of Violence: None Noted Does patient have access to weapons?: Yes (Comment) (is a Emergency planning/management officer, has firearm) Criminal Charges Pending?: No Does patient have a court date: No  Psychosis Hallucinations: None noted Delusions: None noted  Mental Status Report Appear/Hygiene: Disheveled Eye Contact: Fair Motor Activity: Freedom of movement Speech: Logical/coherent Level of Consciousness: Quiet/awake Mood: Depressed Affect: Appropriate to circumstance;Blunted Anxiety Level: None Thought Processes: Relevant;Coherent Judgement: Unimpaired Orientation: Person;Place;Time;Situation Obsessive Compulsive Thoughts/Behaviors: Minimal  Cognitive Functioning Concentration: Decreased Memory: Recent Intact;Remote Intact IQ: Average Insight: Fair Impulse Control: Fair Appetite: Fair Weight Loss: 0 Weight Gain: 0 Sleep: Decreased Total Hours of Sleep: 3 Vegetative Symptoms: Staying in bed  ADLScreening [facility] Assessment Services) Patient's cognitive ability adequate to safely complete daily activities?: Yes Patient able to express need for assistance with ADLs?: Yes Independently performs ADLs?: Yes (appropriate for developmental age)  Prior Inpatient Therapy Prior Inpatient Therapy: No  Prior Outpatient Therapy Prior Outpatient Therapy: Yes Prior Therapy Dates: age 31 Prior Therapy Facilty/Provider(s): can't remember Reason for Treatment: "school stuff"  ADL Screening (condition at time of admission) Patient's cognitive ability adequate to safely complete daily activities?: Yes Patient able to express need for assistance with ADLs?: Yes Independently performs ADLs?: Yes (appropriate for developmental age)       Abuse/Neglect Assessment (Assessment to be complete  while patient is alone) Physical Abuse: Denies Verbal Abuse: Denies Sexual Abuse: Denies Exploitation of patient/patient's resources: Denies Values / Beliefs Cultural Requests During Hospitalization: None Spiritual Requests During Hospitalization: None   Advance Directives (For Healthcare) Advance Directive: Patient does not have advance directive;Patient would not like information Nutrition Screen- [facility] Adult/[facility]/AP Patient's home diet: Regular  Additional Information 1:1 In Past 12 Months?: No CIRT Risk: No Elopement Risk: No Does patient have medical clearance?: Yes     Disposition:  Disposition Initial Assessment Completed for this Encounter: Yes Disposition of Patient: Inpatient treatment program Type of inpatient treatment program: Adult  On Site Evaluation by:   Reviewed with Physician:    Steward Ros 12/18/2012 6:27 AM

## 2012-12-23 ENCOUNTER — Emergency Department: Payer: Self-pay | Admitting: Emergency Medicine

## 2012-12-26 ENCOUNTER — Ambulatory Visit (HOSPITAL_COMMUNITY): Payer: Self-pay | Admitting: Psychiatry

## 2013-01-05 ENCOUNTER — Ambulatory Visit (HOSPITAL_COMMUNITY): Payer: Self-pay | Admitting: Behavioral Health

## 2013-01-07 ENCOUNTER — Ambulatory Visit (HOSPITAL_COMMUNITY): Payer: Self-pay | Admitting: Psychiatry

## 2013-01-13 MED ORDER — VENLAFAXINE HCL ER 150 MG PO CP24: 150.0000 mg | ORAL_CAPSULE | Freq: Every day | ORAL | Status: DC

## 2013-01-13 NOTE — Telephone Encounter (Signed)
Patient was discharged on 150 mg of effexor from his inpatient stay. Will provide 30 day supply and have patient call to reschedule appointment.

## 2013-02-18 ENCOUNTER — Telehealth (HOSPITAL_COMMUNITY): Payer: Self-pay

## 2013-02-18 DIAGNOSIS — F329 Major depressive disorder, single episode, unspecified: Secondary | ICD-10-CM

## 2013-02-18 MED ORDER — VENLAFAXINE HCL ER 150 MG PO CP24: 150.0000 mg | ORAL_CAPSULE | Freq: Every day | ORAL | Status: DC

## 2013-02-18 NOTE — Telephone Encounter (Signed)
Refilled patient's effexor.

## 2013-03-03 ENCOUNTER — Ambulatory Visit (HOSPITAL_COMMUNITY): Payer: Self-pay | Admitting: Psychiatry

## 2013-05-04 ENCOUNTER — Telehealth (HOSPITAL_COMMUNITY): Payer: Self-pay

## 2013-05-04 DIAGNOSIS — F329 Major depressive disorder, single episode, unspecified: Secondary | ICD-10-CM

## 2013-05-05 MED ORDER — VENLAFAXINE HCL ER 150 MG PO CP24: 150.0000 mg | ORAL_CAPSULE | Freq: Every day | ORAL | Status: DC

## 2013-05-05 NOTE — Telephone Encounter (Signed)
Refilled effexor 

## 2013-06-18 ENCOUNTER — Encounter (INDEPENDENT_AMBULATORY_CARE_PROVIDER_SITE_OTHER): Payer: BC Managed Care – PPO

## 2013-07-07 ENCOUNTER — Other Ambulatory Visit: Payer: Self-pay | Admitting: *Deleted

## 2013-07-13 ENCOUNTER — Telehealth (HOSPITAL_COMMUNITY): Payer: Self-pay

## 2013-07-13 DIAGNOSIS — F329 Major depressive disorder, single episode, unspecified: Secondary | ICD-10-CM

## 2013-07-13 DIAGNOSIS — F32A Depression, unspecified: Secondary | ICD-10-CM

## 2013-07-13 MED ORDER — VENLAFAXINE HCL ER 150 MG PO CP24: 150.0000 mg | ORAL_CAPSULE | Freq: Every day | ORAL | Status: DC

## 2013-07-13 NOTE — Telephone Encounter (Signed)
Refill request for effexor completed.

## 2013-07-20 ENCOUNTER — Other Ambulatory Visit: Payer: Self-pay | Admitting: *Deleted

## 2013-07-20 MED ORDER — PANTOPRAZOLE SODIUM 40 MG PO TBEC: 40.0000 mg | DELAYED_RELEASE_TABLET | Freq: Every day | ORAL | Status: DC

## 2013-08-24 ENCOUNTER — Encounter (HOSPITAL_COMMUNITY): Payer: Self-pay | Admitting: Psychiatry

## 2013-08-24 ENCOUNTER — Ambulatory Visit (INDEPENDENT_AMBULATORY_CARE_PROVIDER_SITE_OTHER): Payer: BC Managed Care – PPO | Admitting: Psychiatry

## 2013-08-24 ENCOUNTER — Encounter (INDEPENDENT_AMBULATORY_CARE_PROVIDER_SITE_OTHER): Payer: Self-pay

## 2013-08-24 VITALS — BP 95/72 | HR 107 | Wt 198.0 lb

## 2013-08-24 DIAGNOSIS — F332 Major depressive disorder, recurrent severe without psychotic features: Secondary | ICD-10-CM

## 2013-08-24 DIAGNOSIS — F329 Major depressive disorder, single episode, unspecified: Secondary | ICD-10-CM

## 2013-08-24 DIAGNOSIS — F32A Depression, unspecified: Secondary | ICD-10-CM

## 2013-08-24 MED ORDER — TRAZODONE HCL 100 MG PO TABS: 100.0000 mg | ORAL_TABLET | Freq: Every day | ORAL | Status: DC

## 2013-08-24 MED ORDER — VENLAFAXINE HCL ER 150 MG PO CP24: 150.0000 mg | ORAL_CAPSULE | Freq: Every day | ORAL | Status: DC

## 2013-08-24 MED ORDER — VENLAFAXINE HCL ER 37.5 MG PO CP24: 37.5000 mg | ORAL_CAPSULE | Freq: Every day | ORAL | Status: DC

## 2013-08-24 NOTE — Progress Notes (Signed)
[facility] Follow-up Outpatient Visit  Eric [name] 1975-07-30  Date: 08/24/2013  Chief Complaint  Patient presents with  . Depression  . Anxiety   History of Chief Complaint:   HPI Comments: [name] is a 38 y/o male with a past psychiatric history significant for Depression, NOS. The patient is referred for psychiatric services for medication management.   . Location: The patient reports he had some depression related to his divorce Oct. 2014, but is now dating.  . Quality: The patient reports he is taking his medications and denies any side effects. The patient states he has been doing well and has moved on to another relationship. He states he is taking his medication and denies any side effects.   In the area of affective symptoms, patient appears euthymic. Patient denies current suicidal intent, or plan. Patient denies current homicidal ideation, intent, or plan. Patient denies auditory hallucinations. Patient denies visual hallucinations. Patient denies symptoms of paranoia. 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   Appetite is good. Energy level is fair. Patient reports continued symptoms of anhedonia. Patient denies hopelessness, helplessness, or guilt.    . Severity: Depression: 7/10 (0=Very depressed; 5=Neutral; 10=Very Happy)  Anxiety- 6-7/10 (0=no anxiety; 5= moderate/tolerable anxiety; 10= panic attacks)  . Duration: Started 2 years ago with seperation   . Context: Divorce  . Associated signs and symptoms : Denies any recent episodes consistent with mania, particularly decreased need for sleep with increased energy, grandiosity, impulsivity, hyperverbal and pressured speech, or increased productivity. Denies any recent symptoms consistent with psychosis, particularly auditory or visual hallucinations, thought broadcasting/insertion/withdrawal, or ideas of reference. Also denies excessive worry to the point of physical symptoms as well as any panic  attacks. Denies any history of trauma or symptoms consistent with PTSD such as flashbacks, nightmares, hypervigilance, feelings of numbness or inability to connect with others.   Review of Systems  Constitutional: Positive for weight loss. Negative for fever, chills and diaphoresis.  Respiratory: Negative for cough, shortness of breath and wheezing.   Cardiovascular: Negative for chest pain, palpitations and leg swelling.  Gastrointestinal: Negative for nausea, vomiting, abdominal pain, diarrhea and constipation.  Musculoskeletal:       No muscle weakness or gait problems  Neurological: Negative for dizziness (Since stopping medication.), tremors and focal weakness.     Filed Vitals:   08/24/13 1123  BP: 95/72  Pulse: 107  Weight: 198 lb (89.812 kg)   Physical Exam  Vitals reviewed. Constitutional: He appears well-developed and well-nourished. No distress.  Skin: He is not diaphoretic.  Musculoskeletal: Gait & Station: normal Patient leans: N/A   Traumatic Brain Injury: No   Past Psychiatric History:Reviewed Diagnosis: Depression, NOS  Hospitalizations: Patient denies.  Outpatient Care: Patient denies.  Substance Abuse Care: Patient denies.  Self-Mutilation: Patient denies.  Suicidal Attempts: Patient denies.  Violent Behaviors: Patient denies.   Past Medical History:  Reviewed Past Medical History  Diagnosis Date  . Overweight   . Depression   . Acid reflux    History of Loss of Consciousness:  No Seizure History:  No Cardiac History:  No  Allergies:  No Known Allergies  Current Medications:Reviewed Current Outpatient Prescriptions on File Prior to Visit  Medication Sig Dispense Refill  . pantoprazole (PROTONIX) 40 MG tablet Take 1 tablet (40 mg total) by mouth daily. Please schedule an appointment with Dr Dayton MartesAron for additional refills.  30 tablet  0  . traZODone (DESYREL) 100 MG tablet Take 50-100 mg by  mouth at bedtime.      Marland Kitchen. venlafaxine XR (EFFEXOR-XR) 150  MG 24 hr capsule Take 1 capsule (150 mg total) by mouth daily.  30 capsule  1   No current facility-administered medications on file prior to visit.    Previous Psychotropic Medications:Reviewed  Medication Dose   Venlafaxine: Currently  75 mg   Alprazolam: currently  0.25 mg   Zolpidem  10 mg   Citalopram:    Fluoxetine    Substance Abuse History in the last 12 months:Reviewed History   Social History  . Marital Status: Married    Spouse Name: N/A    Number of Children: 2  . Years of Education: N/A   Occupational History  . POLICE OFFICER    Social History Main Topics  . Smoking status: Never Smoker   . Smokeless tobacco: Current User    Types: Chew     Comment: Pouches-3-4 pouches/day for 6-8 years-in the process of quitting  . Alcohol Use: Yes     Comment: Twice a year.  . Drug Use: No     Comment: Caffiene: None  . Sexual Activity: Yes    Partners: Female     Comment: Patient reports some decreased libido   Other Topics Concern  . Not on file   Social History Narrative  . No narrative on file     Medical Consequences of Substance Abuse:N/A  Legal Consequences of Substance Abuse:N/A  Family Consequences of Substance Abuse: N/A  Blackouts:  No DT's:  No Withdrawal Symptoms:  No None  Social History:Reviewed Current Place of Residence: [place], [place] Place of Birth: [place], [place] Family Members: Has an two older brothers, and one older sister: Eric [name](Eric [name], Eric ChessNicole, Eric [name]) Marital Status:  Separated Children: 2  Sons: 1  Daughters: 1 Relationships: The patient reports his brother is his main source of emotional support. Education:  HS Graduate Educational Problems/Performance: B's,C's, had problems with spelling Religious Beliefs/Practices: [facility]es [employer]. History of Abuse: none Occupational Experiences: Works for the [place] He reports he enjoys his job. Military History:  None. Legal History: None Hobbies/Interests: Plays  sports with his son.   Family History:  Reviewed Family History  Problem Relation Age of Onset  . Heart disease Father   . Heart attack Father [id number]    Psychiatric specialty exam: Objective:  Appearance: Casual and Neat  Eye Contact::  Good  Speech:  Clear and Coherent and Normal Rate  Volume:  Normal  Mood: "good"   Affect:  Appropriate, Congruent and Full Range  Thought Process:  Coherent, Logical and Loose  Orientation:  Full  Thought Content:  WDL  Suicidal Thoughts:  No  Homicidal Thoughts:  No  Judgement:  Good  Insight:  Good  Psychomotor Activity:  Normal  Akathisia:  Yes  Handed:  Right  Language-Intact  Fund of Knowledge-average to above average.  Memory: Intact immediate 3/3; recent 3/3  Assets:  Communication Skills Desire for Improvement Financial Resources/Insurance Housing Intimacy Leisure Time Physical Health Resilience Social Support Talents/Skills Transportation Vocational/Educational    Laboratory/X-Ray Psychological Evaluation(s)  None  None   Assessment:   AXIS I Major Depressive disorder, recurrent, severe, without mention of psychotic behavior-improving   AXIS II No diagnosis  AXIS III Past Medical History  Diagnosis Date  . Overweight   . Depression   . Acid reflux      AXIS IV economic problems, educational problems, housing problems, occupational problems, other psychosocial or environmental problems, problems related to legal  system/crime, problems related to social environment, problems with access to health care services and problems with primary support group  AXIS V  60   Treatment Plan/Recommendations:  PLAN:  1. Affirm with the patient that the medications are taken as ordered. Patient  expressed understanding of how their medications were to be used.  2. Continue the following psychiatric medications as written prior to this appointment/ with the following changes:  a) Increase Venlafaxine XR 150 mg+37.5 mg b)  Trazodone 100 mg 1/2 to 2 tablets  3. Therapy: brief supportive therapy provided. More than 50% of the visit was spent on individual therapy/counseling. 4. Risks and benefits, side effects and alternatives discussed with patient, she was given an opportunity to ask questions about his/her medication, illness, and treatment. All current psychiatric medications have been reviewed and discussed with the patient and adjusted as clinically appropriate. The patient has been provided an accurate and updated list of the medications being now prescribed.  5. Patient told to call clinic if any problems occur. Patient advised to go to  ER  if she should develop SI/HI, side effects, or if symptoms worsen. Has crisis numbers to call if needed.   6. No labs warranted at this time.  7. The patient was encouraged to keep all PCP and specialty clinic appointments.  8. Patient was instructed to return to clinic in 1-2 months.  9. The patient was advised to call and cancel their mental health appointment within 24 hours of the appointment, if they are unable to keep the appointment, as well as the three no show and termination from clinic policy. 10. The patient expressed understanding of the plan and agrees with the above. 11. Patient informed that April 15th, 2015 would be my last day at this clinic.  Jacqulyn Cane, M.D.  08/24/2013 11:18 AM

## 2013-09-08 ENCOUNTER — Other Ambulatory Visit: Payer: Self-pay

## 2013-09-08 MED ORDER — PANTOPRAZOLE SODIUM 40 MG PO TBEC: 40.0000 mg | DELAYED_RELEASE_TABLET | Freq: Every day | ORAL | Status: DC

## 2013-09-08 NOTE — Telephone Encounter (Signed)
Pt left v/m requesting refill pantoprazole to [facility] [address]. Pt already has appt 09/14/13 with Dr Dayton MartesAron; pt notified by v/m to ck with pharmacy.

## 2013-09-14 ENCOUNTER — Ambulatory Visit: Payer: Self-pay | Admitting: Family Medicine

## 2013-09-14 DIAGNOSIS — Z0289 Encounter for other administrative examinations: Secondary | ICD-10-CM

## 2013-10-07 IMAGING — CR DG CHEST 2V
1 series · 2 of 2 positions shown · non-contrast
Comparison: none

REASON FOR EXAM: OVERDOSE
COMMENTS:   May transport without cardiac monitor

PROCEDURE:     DXR - DXR CHEST PA (OR AP) AND LATERAL  - August 04, 2012 [DATE]
RESULT:     Comparison: 02/22/2012

[Series 1: ap · 0.17mm/px · 2 of 2 slices shown]
[im 1/2]
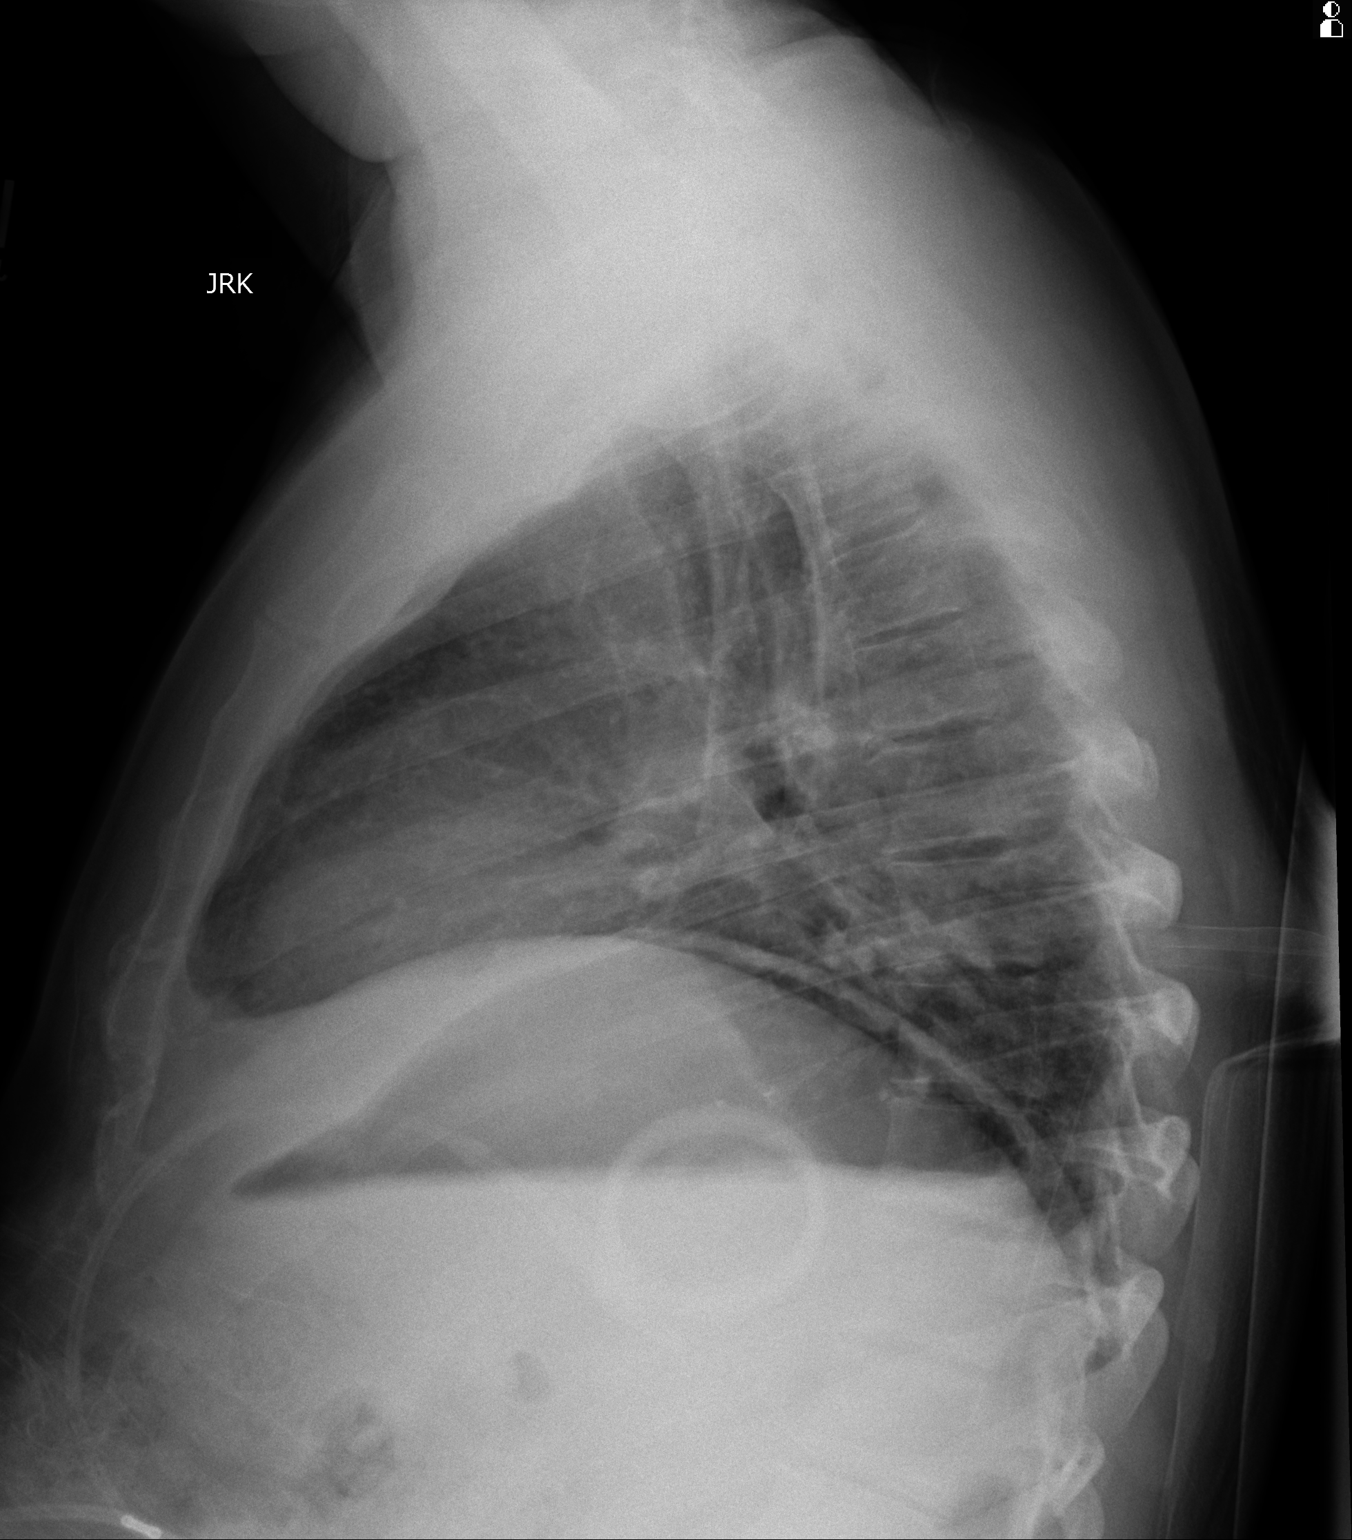
[im 2/2]
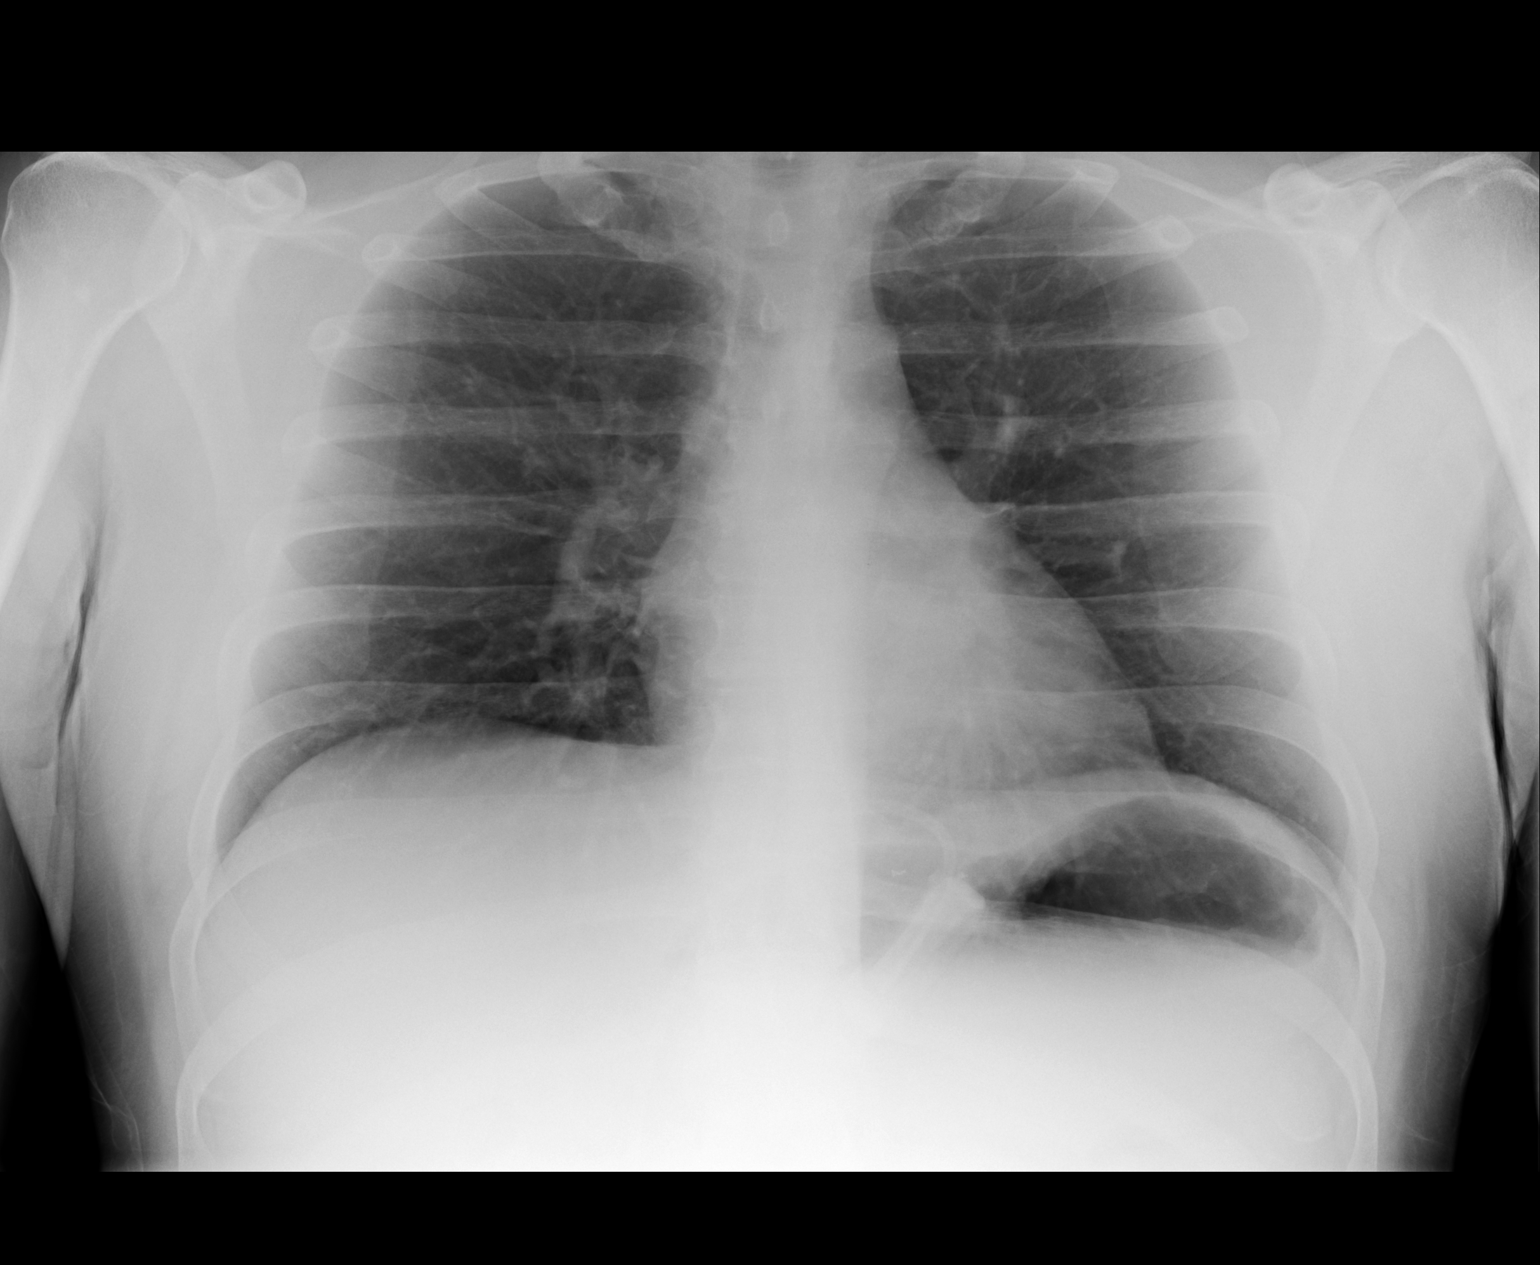

[2 of 2 positions shown; findings below may reference images not displayed]

FINDINGS: The heart and mediastinum are stable. The lung volumes are low. Radiodensity
overlying the left upper quadrant may represent a lap band.
IMPRESSION: No acute cardiopulmonary disease.

[REDACTED]

## 2013-11-25 ENCOUNTER — Emergency Department: Payer: Self-pay | Admitting: Emergency Medicine

## 2013-12-10 ENCOUNTER — Encounter (INDEPENDENT_AMBULATORY_CARE_PROVIDER_SITE_OTHER): Payer: Self-pay

## 2013-12-10 ENCOUNTER — Ambulatory Visit (INDEPENDENT_AMBULATORY_CARE_PROVIDER_SITE_OTHER): Payer: BC Managed Care – PPO | Admitting: Physician Assistant

## 2013-12-10 VITALS — BP 112/78 | HR 64 | Temp 98.7°F | Resp 14 | Ht 65.5 in | Wt 193.2 lb

## 2013-12-10 DIAGNOSIS — Z4651 Encounter for fitting and adjustment of gastric lap band: Secondary | ICD-10-CM

## 2013-12-10 NOTE — Progress Notes (Signed)
  HISTORY: Eric [name] is a 38 y.o.male who received an AP-Large lap-band in April 2012 by Dr. Johna SheriffHoxworth. The patient has gained five lbs since their last visit in January 2014, and has lost 53 lbs since surgery. He is feeling very well. He has noticed some increase in his hunger and portion sizes, but has no complaints of regurgitation or reflux. He remains physically active. He is off all medications short of Effexor. He'd like a fill today.  VITAL SIGNS: Filed Vitals:   12/10/13 0959  BP: 112/78  Pulse: 64  Temp: 98.7 F (37.1 C)  Resp: 14    PHYSICAL EXAM: Physical exam reveals a very well-appearing 38 y.o.male in no apparent distress Neurologic: Awake, alert, oriented Psych: Bright affect, conversant Respiratory: Breathing even and unlabored. No stridor or wheezing Abdomen: Soft, nontender, nondistended to palpation. Incisions well-healed. No incisional hernias. Port easily palpated. Extremities: Atraumatic, good range of motion.  ASSESMENT: 38 y.o.  male  s/p AP-Large lap-band.   PLAN: The patient's port was accessed with a 20G Huber needle without difficulty. Clear fluid was aspirated and 0.5 mL saline was added to the port to give a total predicted volume of 9 mL. The patient was able to swallow water without difficulty following the procedure and was instructed to take clear liquids for the next 24-48 hours and advance slowly as tolerated. We'll have him back in six months or sooner if needed.

## 2013-12-10 NOTE — Patient Instructions (Signed)

## 2013-12-17 ENCOUNTER — Ambulatory Visit: Payer: Self-pay | Admitting: Family Medicine

## 2013-12-17 ENCOUNTER — Other Ambulatory Visit: Payer: Self-pay | Admitting: Family Medicine

## 2013-12-17 MED ORDER — PANTOPRAZOLE SODIUM 40 MG PO TBEC: 40.0000 mg | DELAYED_RELEASE_TABLET | Freq: Every day | ORAL | Status: DC

## 2014-01-01 ENCOUNTER — Encounter (HOSPITAL_COMMUNITY): Payer: Self-pay | Admitting: Psychiatry

## 2014-01-01 ENCOUNTER — Ambulatory Visit (INDEPENDENT_AMBULATORY_CARE_PROVIDER_SITE_OTHER): Payer: BC Managed Care – PPO | Admitting: Psychiatry

## 2014-01-01 VITALS — BP 114/80 | HR 89 | Ht 65.0 in | Wt 180.0 lb

## 2014-01-01 DIAGNOSIS — F332 Major depressive disorder, recurrent severe without psychotic features: Secondary | ICD-10-CM

## 2014-01-01 DIAGNOSIS — F329 Major depressive disorder, single episode, unspecified: Secondary | ICD-10-CM

## 2014-01-01 DIAGNOSIS — F32A Depression, unspecified: Secondary | ICD-10-CM

## 2014-01-01 MED ORDER — VENLAFAXINE HCL ER 150 MG PO CP24: 150.0000 mg | ORAL_CAPSULE | Freq: Every day | ORAL | Status: DC

## 2014-01-01 MED ORDER — VENLAFAXINE HCL ER 37.5 MG PO CP24: 37.5000 mg | ORAL_CAPSULE | Freq: Every day | ORAL | Status: DC

## 2014-01-01 MED ORDER — TRAZODONE HCL 100 MG PO TABS: 100.0000 mg | ORAL_TABLET | Freq: Every day | ORAL | Status: DC

## 2014-01-01 NOTE — Progress Notes (Signed)
Patient ID: Eric [name], male   DOB: November 02, 1975, 38 y.o.   MRN: [id number]   [facility] Follow-up Outpatient Visit  KAVI ALMQUIST 06/27/1975  Date: 01/01/2014  Chief Complaint  Patient presents with  . Depression  . Anxiety   History of Chief Complaint:   HPI Comments: Mr. Fitz is a 38 y/o male with a past psychiatric history significant for Depression, NOS. The patient is referred for psychiatric services for medication management.   . Location: The patient reports he had some depression related to his divorce Oct. 2014, but they both are working on the relationship. He may get back together. He was started on medications because of his possible divorced in 2014. 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 Effexor has been helpful for depression. Does not endorse hopelessness or helplessness to . Quality:  depression improved and not worse  In the area of affective symptoms, patient appears euthymic. Patient denies current suicidal intent, or plan. Patient denies current homicidal ideation, intent, or plan. Patient denies auditory hallucinations. Patient denies visual hallucinations. Patient denies symptoms of paranoia. 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   Appetite is good. Energy level is fair. Patient reports continued symptoms of anhedonia. Patient denies hopelessness, helplessness, or guilt.    . Severity: Depression: 7/10 (0=Very depressed; 5=Neutral; 10=Very Happy)  Anxiety- 6-7/10 (0=no anxiety; 5= moderate/tolerable anxiety; 10= panic attacks)  . Duration: Started 2 years ago with seperation   . Context: Divorce  . Associated signs and symptoms : Denies any recent episodes consistent with mania, particularly decreased need for sleep with increased energy, grandiosity, impulsivity, hyperverbal and pressured speech, or increased productivity. Denies any recent  symptoms consistent with psychosis, particularly auditory or visual hallucinations, thought broadcasting/insertion/withdrawal, or ideas of reference. Also denies excessive worry to the point of physical symptoms as well as any panic attacks. Denies any history of trauma or symptoms consistent with PTSD such as flashbacks, nightmares, hypervigilance, feelings of numbness or inability to connect with others.   Review of Systems  Constitutional: Positive for weight loss. Negative for diaphoresis.  Cardiovascular: Negative for chest pain and leg swelling.  Gastrointestinal: Negative for nausea and vomiting.  Musculoskeletal:       No muscle weakness or gait problems  Neurological: Negative for dizziness (Since stopping medication.), tremors and focal weakness.  Psychiatric/Behavioral: Negative for depression, suicidal ideas, hallucinations and substance abuse.     Filed Vitals:   01/01/14 1223  BP: 114/80  Pulse: 89  Height: 5\' 5"  (1.651 m)  Weight: 180 lb (81.647 kg)   Physical Exam  Vitals reviewed. Constitutional: He appears well-developed and well-nourished. No distress.  Skin: He is not diaphoretic.  Musculoskeletal: Gait & Station: normal Patient leans: N/A    Past Medical History:  Reviewed Past Medical History  Diagnosis Date  . Overweight   . Depression   . Acid reflux    History of Loss of Consciousness:  No Seizure History:  No Cardiac History:  No  Allergies:  No Known Allergies  Current Medications:Reviewed Current Outpatient Prescriptions on File Prior to Visit  Medication Sig Dispense Refill  . pantoprazole (PROTONIX) 40 MG tablet Take 1 tablet (40 mg total) by mouth daily.  30 tablet  3   No current facility-administered medications on file prior to visit.    Previous Psychotropic Medications:Reviewed  Medication Dose   Venlafaxine: Currently  75 mg   Alprazolam: currently  0.25 mg   Zolpidem  10 mg   Citalopram:    Fluoxetine    Substance Abuse  History in the last 12 months:Reviewed History   Social History  . Marital Status: Married    Spouse Name: N/A    Number of Children: 2  . Years of Education: N/A   Occupational History  . POLICE OFFICER    Social History Main Topics  . Smoking status: Never Smoker   . Smokeless tobacco: Current User    Types: Chew     Comment: Pouches-3-4 pouches/day for 6-8 years-in the process of quitting  . Alcohol Use: Yes     Comment: Twice a year.  . Drug Use: No     Comment: Caffiene: None  . Sexual Activity: Yes    Partners: Female     Comment: Patient reports some decreased libido   Other Topics Concern  . Not on file   Social History Narrative  . No narrative on file       Family History:  Reviewed Family History  Problem Relation Age of Onset  . Heart disease Father   . Heart attack Father 8066    Psychiatric specialty exam: Objective:  Appearance: Casual and Neat  Eye Contact::  Good  Speech:  Clear and Coherent and Normal Rate  Volume:  Normal  Mood: "good"   Affect:  Appropriate, Congruent and Full Range  Thought Process:  Coherent, Logical and Loose  Orientation:  Full  Thought Content:  WDL  Suicidal Thoughts:  No  Homicidal Thoughts:  No  Judgement:  Good  Insight:  Good  Psychomotor Activity:  Normal  Akathisia:  Yes  Handed:  Right  Language-Intact  Fund of Knowledge-average to above average.  Memory: Intact immediate 3/3; recent 3/3  Assets:  Communication Skills Desire for Improvement Financial Resources/Insurance Housing Intimacy Leisure Time Physical Health Resilience Social Support Talents/Skills Transportation Vocational/Educational    Laboratory/X-Ray Psychological Evaluation(s)  None  None   Assessment:   AXIS I Major Depressive disorder, recurrent, severe, without mention of psychotic behavior-improving   AXIS II No diagnosis  AXIS III Past Medical History  Diagnosis Date  . Overweight   . Depression   . Acid reflux       AXIS IV economic problems, educational problems, housing problems, occupational problems, other psychosocial or environmental problems, problems related to legal system/crime, problems related to social environment, problems with access to health care services and problems with primary support group  AXIS V  60   Treatment Plan/Recommendations:  PLAN:  1. Affirm with the patient that the medications are taken as ordered. Patient  expressed understanding of how their medications were to be used.  2. Continue the following psychiatric medications as written prior to this appointment/ with the following changes:  A) Continue Venlafaxine XR 150 mg+37.5 mg b) Trazodone 100 mg 1 tablet. Change his 100 mg when necessary if needed he seldom uses it now. Discussed weight maintenance. He has had an appendectomy surgery kind of gastric bypass that has helped him lose weight in the last few years 3. Therapy: brief supportive therapy provided. More than 50% of the visit was spent on individual therapy/counseling. Patient is going with possible therapy as a couple and they're working in the relationship. 4. Risks and benefits, side effects and alternatives discussed with patient, she was given an opportunity to ask questions about his/her medication, illness, and treatment. All current psychiatric medications have been reviewed  and discussed with the patient and adjusted as clinically appropriate. The patient has been provided an accurate and updated list of the medications being now prescribed.  5. Patient told to call clinic if any problems occur. Patient advised to go to  ER  if she should develop SI/HI, side effects, or if symptoms worsen. Has crisis numbers to call if needed.   6. No labs warranted at this time.  7. The patient was encouraged to keep all PCP and specialty clinic appointments.  8. Patient was instructed to return to clinic in 1-2 months.  9. The patient was advised to call and cancel  their mental health appointment within 24 hours of the appointment, if they are unable to keep the appointment, as well as the three no show and termination from clinic policy. 10. The patient expressed understanding of the plan and agrees with the above. 11. Patient informed that April 15th, 2015 would be my last day at this clinic.  Thresa Ross, M.D.  01/01/2014 12:27 PM

## 2014-02-26 ENCOUNTER — Telehealth (HOSPITAL_COMMUNITY): Payer: Self-pay

## 2014-02-26 DIAGNOSIS — F329 Major depressive disorder, single episode, unspecified: Secondary | ICD-10-CM

## 2014-02-26 DIAGNOSIS — F32A Depression, unspecified: Secondary | ICD-10-CM

## 2014-02-26 MED ORDER — TRAZODONE HCL 100 MG PO TABS: 100.0000 mg | ORAL_TABLET | Freq: Every day | ORAL | Status: DC

## 2014-02-26 MED ORDER — VENLAFAXINE HCL ER 150 MG PO CP24: 150.0000 mg | ORAL_CAPSULE | Freq: Every day | ORAL | Status: DC

## 2014-02-26 MED ORDER — VENLAFAXINE HCL ER 37.5 MG PO CP24: 37.5000 mg | ORAL_CAPSULE | Freq: Every day | ORAL | Status: DC

## 2014-02-26 NOTE — Telephone Encounter (Signed)
Refills sent to pharmacy for trazadone and effexor's
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
## 2014-03-05 ENCOUNTER — Ambulatory Visit (HOSPITAL_COMMUNITY): Payer: Self-pay | Admitting: Psychiatry

## 2014-03-29 ENCOUNTER — Encounter (HOSPITAL_COMMUNITY): Payer: Self-pay | Admitting: Psychiatry

## 2014-03-29 ENCOUNTER — Ambulatory Visit (INDEPENDENT_AMBULATORY_CARE_PROVIDER_SITE_OTHER): Payer: BLUE CROSS/BLUE SHIELD | Admitting: Psychiatry

## 2014-03-29 VITALS — BP 101/83 | HR 100 | Ht 65.0 in | Wt 183.0 lb

## 2014-03-29 DIAGNOSIS — F332 Major depressive disorder, recurrent severe without psychotic features: Secondary | ICD-10-CM

## 2014-03-29 DIAGNOSIS — F32A Depression, unspecified: Secondary | ICD-10-CM

## 2014-03-29 DIAGNOSIS — F329 Major depressive disorder, single episode, unspecified: Secondary | ICD-10-CM

## 2014-03-29 MED ORDER — VENLAFAXINE HCL ER 75 MG PO CP24: 75.0000 mg | ORAL_CAPSULE | Freq: Every day | ORAL | Status: DC

## 2014-03-29 MED ORDER — VENLAFAXINE HCL ER 150 MG PO CP24: 150.0000 mg | ORAL_CAPSULE | Freq: Every day | ORAL | Status: DC

## 2014-03-29 MED ORDER — TRAZODONE HCL 100 MG PO TABS
150.0000 mg | ORAL_TABLET | Freq: Every day | ORAL | Status: DC
Start: 2014-03-29 — End: 2014-07-01

## 2014-03-29 NOTE — Progress Notes (Signed)
Patient ID: Eric [name], male   DOB: Aug 07, 1975, 38 y.o.   MRN: [id number]   [facility] Follow-up Outpatient Visit  Eric [name] 1976/02/08  Date: 03/29/2014  Chief Complaint  Patient presents with  . Depression  . Anxiety   History of Chief Complaint:   HPI Comments: Mr. Lardizabal is a 38 y/o male with a past psychiatric history significant for Depression, NOS. The patient is referred for psychiatric services for medication management.   . Location: The patient reports he had some depression related to his divorce Oct. 2014,but now they have both worked together and now back together. He wanted to get back because of his son and daughter. Complication is that he had to leave his ex-girlfriend. They're on found that the girlfriend was also sleeping with his brother. She is also mad that he is now back with his wife. These things still bother him and he feels distracted and at times its effects his mood and he feels down but not hopeless or suicidal. Has some concern about poor sleep despite being on trazodone. There is no reported side effects from Effexor . Quality:  depression related to x girl friend and his brother.   In the area of affective symptoms, patient appears euthymic. Patient denies current suicidal intent, or plan. Patient denies current homicidal ideation, intent, or plan. Patient denies auditory hallucinations. Patient denies visual hallucinations. Patient denies symptoms of paranoia. 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   Appetite is good. Energy level is fair. Patient reports continued symptoms of anhedonia. Patient denies hopelessness, helplessness, or guilt.    . Severity: Depression: 6/10 (0=Very depressed; 5=Neutral; 10=Very Happy)  Anxiety- 6/10 (0=no anxiety; 5= moderate/tolerable anxiety; 10= panic attacks)  . Duration: Started 2 years ago with seperation   . Context: Divorce and now back togeter.   . Associated signs and symptoms  : Denies any recent episodes consistent with mania, particularly decreased need for sleep with increased energy, grandiosity, impulsivity, hyperverbal and pressured speech, or increased productivity. Denies any recent symptoms consistent with psychosis, particularly auditory or visual hallucinations, thought broadcasting/insertion/withdrawal, or ideas of reference. Also denies excessive worry to the point of physical symptoms as well as any panic attacks. Denies any history of trauma or symptoms consistent with PTSD such as flashbacks, nightmares, hypervigilance, feelings of numbness or inability to connect with others.   Review of Systems  Constitutional: Negative for fever.  Cardiovascular: Negative for chest pain.  Gastrointestinal: Negative for nausea.  Musculoskeletal:       No muscle weakness or gait problems  Neurological: Negative for tingling and focal weakness. Dizziness: Since stopping medication.  Psychiatric/Behavioral: Positive for depression. Negative for suicidal ideas, hallucinations and substance abuse.     Filed Vitals:   03/29/14 1336  BP: 101/83  Pulse: 100  Height: 5\' 5"  (1.651 m)  Weight: 183 lb (83.008 kg)   Physical Exam  Vitals reviewed. Constitutional: He appears well-developed and well-nourished. No distress.  Skin: He is not diaphoretic.  Musculoskeletal: Gait & Station: normal Patient leans: N/A    Past Medical History:  Reviewed Past Medical History  Diagnosis Date  . Overweight   . Depression   . Acid reflux    History of Loss of Consciousness:  No Seizure History:  No Cardiac History:  No  Allergies:  No Known Allergies  Current Medications:Reviewed Current Outpatient Prescriptions on File Prior to Visit  Medication Sig Dispense Refill  . pantoprazole (PROTONIX) 40 MG tablet Take 1  tablet (40 mg total) by mouth daily. 30 tablet 3   No current facility-administered medications on file prior to visit.      Substance Abuse History in  the last 12 months:Reviewed History   Social History  . Marital Status: Married    Spouse Name: N/A    Number of Children: 2  . Years of Education: N/A   Occupational History  . POLICE OFFICER    Social History Main Topics  . Smoking status: Never Smoker   . Smokeless tobacco: Current User    Types: Chew     Comment: Pouches-3-4 pouches/day for 6-8 years-in the process of quitting  . Alcohol Use: Yes     Comment: Twice a year.  . Drug Use: No     Comment: Caffiene: None  . Sexual Activity:    Partners: Female     Comment: Patient reports some decreased libido   Other Topics Concern  . Not on file   Social History Narrative       Family History:  Reviewed Family History  Problem Relation Age of Onset  . Heart disease Father   . Heart attack Father [id number]    Psychiatric specialty exam: Objective:  Appearance: Casual and Neat  Eye Contact::  Good  Speech:  Clear and Coherent and Normal Rate  Volume:  Normal  Mood: dysphoric but not hopeless.  Affect:  Appropriate, Congruent and Full Range  Thought Process:  Coherent, Logical and Loose  Orientation:  Full  Thought Content:  WDL  Suicidal Thoughts:  No  Homicidal Thoughts:  No  Judgement:  Good  Insight:  Good  Psychomotor Activity:  Normal  Akathisia:  Yes  Handed:  Right  Language-Intact  Fund of Knowledge-average to above average.  Memory: Intact immediate 3/3; recent 3/3  Assets:  Communication Skills Desire for Improvement Financial Resources/Insurance Housing Intimacy Leisure Time Physical Health Resilience Social Support Talents/Skills Transportation Vocational/Educational    Laboratory/X-Ray Psychological Evaluation(s)  None  None   Assessment:   AXIS I Major Depressive disorder, recurrent, severe, without mention of psychotic behavior-improving   AXIS II No diagnosis  AXIS III Past Medical History  Diagnosis Date  . Overweight   . Depression   . Acid reflux      AXIS IV  economic problems, educational problems, housing problems, occupational problems, other psychosocial or environmental problems, problems related to legal system/crime, problems related to social environment, problems with access to health care services and problems with primary support group  AXIS V  55   Treatment Plan/Recommendations:  PLAN:  1. Affirm with the patient that the medications are taken as ordered. Patient  expressed understanding of how their medications were to be used.  2. Continue the following psychiatric medications as written prior to this appointment/ with the following changes:  A) increase  Venlafaxine XR 150 mg+ 75 mg b) increase Trazodone 150 mg 1 tablet. 3. Therapy: brief supportive therapy provided. More than 50% of the visit was spent on individual therapy/counseling. Patient is going with possible therapy as a couple and they're working in the relationship. 4. Risks and benefits, side effects and alternatives discussed with patient, she was given an opportunity to ask questions about his/her medication, illness, and treatment. All current psychiatric medications have been reviewed and discussed with the patient and adjusted as clinically appropriate. The patient has been provided an accurate and updated list of the medications being now prescribed.  5. Patient told to call clinic if any problems occur. Patient  advised to go to  ER  if she should develop SI/HI, side effects, or if symptoms worsen. Has crisis numbers to call if needed.   6. No labs warranted at this time.  7. The patient was encouraged to keep all PCP and specialty clinic appointments.  8. Patient was instructed to return to clinic in 1-2 months.  9. The patient was advised to call and cancel their mental health appointment within 24 hours of the appointment, if they are unable to keep the appointment, as well as the three no show and termination from clinic policy. 10. The patient expressed  understanding of the plan and agrees with the above.   Thresa RossNADEEM Jaleel Allen, M.D.  03/29/2014 1:46 PM

## 2014-05-24 ENCOUNTER — Telehealth (HOSPITAL_COMMUNITY): Payer: Self-pay | Admitting: *Deleted

## 2014-05-25 ENCOUNTER — Ambulatory Visit (HOSPITAL_COMMUNITY): Payer: Self-pay | Admitting: Psychiatry

## 2014-05-26 NOTE — Telephone Encounter (Signed)
Pt need refills for Trazodone and Effexor. Inform pt he would need to schedule appt. Pt states and shows understanding.

## 2014-06-28 ENCOUNTER — Ambulatory Visit (HOSPITAL_COMMUNITY): Payer: Self-pay | Admitting: Psychiatry

## 2014-06-28 ENCOUNTER — Other Ambulatory Visit (HOSPITAL_COMMUNITY): Payer: Self-pay | Admitting: Psychiatry

## 2014-07-01 ENCOUNTER — Encounter (INDEPENDENT_AMBULATORY_CARE_PROVIDER_SITE_OTHER): Payer: Self-pay

## 2014-07-01 ENCOUNTER — Ambulatory Visit (INDEPENDENT_AMBULATORY_CARE_PROVIDER_SITE_OTHER): Payer: BLUE CROSS/BLUE SHIELD | Admitting: Psychiatry

## 2014-07-01 ENCOUNTER — Encounter (HOSPITAL_COMMUNITY): Payer: Self-pay | Admitting: Psychiatry

## 2014-07-01 VITALS — BP 125/86 | HR 94 | Ht 65.0 in | Wt 186.0 lb

## 2014-07-01 DIAGNOSIS — F332 Major depressive disorder, recurrent severe without psychotic features: Secondary | ICD-10-CM

## 2014-07-01 DIAGNOSIS — F329 Major depressive disorder, single episode, unspecified: Secondary | ICD-10-CM

## 2014-07-01 DIAGNOSIS — F32A Depression, unspecified: Secondary | ICD-10-CM

## 2014-07-01 DIAGNOSIS — F331 Major depressive disorder, recurrent, moderate: Secondary | ICD-10-CM

## 2014-07-01 MED ORDER — VENLAFAXINE HCL ER 150 MG PO CP24: 150.0000 mg | ORAL_CAPSULE | Freq: Every day | ORAL | Status: DC

## 2014-07-01 MED ORDER — VENLAFAXINE HCL ER 75 MG PO CP24: 75.0000 mg | ORAL_CAPSULE | Freq: Every day | ORAL | Status: DC

## 2014-07-01 MED ORDER — TRAZODONE HCL 100 MG PO TABS
150.0000 mg | ORAL_TABLET | Freq: Every day | ORAL | Status: DC
Start: 2014-07-01 — End: 2014-09-13

## 2014-07-01 NOTE — Progress Notes (Signed)
Patient ID: Eric [name], male   DOB: 08/02/1975, 39 y.o.   MRN: [id number]   [facility] Follow-up Outpatient Visit  KIMOTHY KISHIMOTO 08/31/1975  Date: 07/01/2014  Chief Complaint  Patient presents with  . Depression  . Anxiety   History of Chief Complaint:   HPI Comments: Mr. Quirk is a 39 y/o male with a past psychiatric history significant for Depressive disorder, moderate recurrent. The patient is referred for psychiatric services for medication management.   . Location: The patient reports he had some depression related to his divorce Oct. 2014,but now they have both worked together and now back together. He wanted to get back because of his son and daughter. Complication is that he had to leave his ex-girlfriend. They're on found that the girlfriend was also sleeping with his brother. She is also mad that he is now back with his wife. These things still bother him and he feels distracted and at times its effects his mood and he feels down but not hopeless or suicidal. Has some concern about poor sleep despite being on trazodone. There is no reported side effects from from effexor.  . Quality:   In the area of affective symptoms, patient appears euthymic. Patient denies current suicidal intent, or plan. Patient denies current homicidal ideation, intent, or plan. Patient denies auditory hallucinations. Patient denies visual hallucinations. Patient denies symptoms of paranoia. 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   Appetite is good. Energy level is fair. Patient reports continued symptoms of anhedonia. Patient denies hopelessness, helplessness, or guilt.   Last visit increased Effexor to 225 mg continue trazodone he still has some difficulty causing a shift worker. When he does do daytime shift he does sleep okay at night. Things are patching up in the relationships with her stress level is low. . Severity: Depression: 7/10 (0=Very depressed; 5=Neutral; 10=Very Happy)   Anxiety- 4/10 (0=no anxiety; 5= moderate/tolerable anxiety; 10= panic attacks)  . Duration: Started 2 years ago with seperation   . Context: Divorce and now back togeter.   . Associated signs and symptoms : Denies any recent episodes consistent with mania, particularly decreased need for sleep with increased energy, grandiosity, impulsivity, hyperverbal and pressured speech, or increased productivity. Denies any recent symptoms consistent with psychosis, particularly auditory or visual hallucinations, thought broadcasting/insertion/withdrawal, or ideas of reference. Also denies excessive worry to the point of physical symptoms as well as any panic attacks. Denies any history of trauma or symptoms consistent with PTSD such as flashbacks, nightmares, hypervigilance, feelings of numbness or inability to connect with others.   Review of Systems  Constitutional: Negative.   Cardiovascular: Negative for chest pain.  Gastrointestinal: Negative for nausea.  Neurological: Negative for tremors.  Psychiatric/Behavioral: Negative for depression, suicidal ideas and substance abuse.     Filed Vitals:   07/01/14 1359  BP: 125/86  Pulse: 94  Height:  (1.651 m)  Weight: 186 lb (84.369 kg)   Physical Exam  Vitals reviewed. Constitutional: He appears well-developed and well-nourished. No distress.  Skin: He is not diaphoretic.  Musculoskeletal: Gait & Station: normal Patient leans: N/A    Past Medical History:  Reviewed Past Medical History  Diagnosis Date  . Overweight   . Depression   . Acid reflux    History of Loss of Consciousness:  No Seizure History:  No Cardiac History:  No  Allergies:  No Known Allergies  Current Medications:Reviewed Current Outpatient Prescriptions on File Prior to Visit  Medication Sig  Dispense Refill  . pantoprazole (PROTONIX) 40 MG tablet Take 1 tablet (40 mg total) by mouth daily. 30 tablet 3   No current facility-administered medications on  file prior to visit.      Substance Abuse History in the last 12 months:Reviewed History   Social History  . Marital Status: Married    Spouse Name: N/A  . Number of Children: 2  . Years of Education: N/A   Occupational History  . POLICE OFFICER    Social History Main Topics  . Smoking status: Never Smoker   . Smokeless tobacco: Current User    Types: Chew     Comment: Pouches-3-4 pouches/day for 6-8 years-in the process of quitting  . Alcohol Use: Yes     Comment: Twice a year.  . Drug Use: No     Comment: Caffiene: None  . Sexual Activity:    Partners: Female     Comment: Patient reports some decreased libido   Other Topics Concern  . Not on file   Social History Narrative       Family History:  Reviewed Family History  Problem Relation Age of Onset  . Heart disease Father   . Heart attack Father 45    Psychiatric specialty exam: Objective:  Appearance: Casual and Neat  Eye Contact::  Good  Speech:  Clear and Coherent and Normal Rate  Volume:  Normal  Mood: euthymic  Affect:  Appropriate, Congruent and Full Range  Thought Process:  Coherent, Logical and Loose  Orientation:  Full  Thought Content:  WDL  Suicidal Thoughts:  No  Homicidal Thoughts:  No  Judgement:  Good  Insight:  Good  Psychomotor Activity:  Normal  Akathisia:  Yes  Handed:  Right  Language-Intact  Fund of Knowledge-average to above average.  Memory: Intact immediate 3/3; recent 3/3  Assets:  Communication Skills Desire for Improvement Financial Resources/Insurance Housing Intimacy Leisure Time Physical Health Resilience Social Support Talents/Skills Transportation Vocational/Educational    Laboratory/X-Ray Psychological Evaluation(s)  None  None   Assessment:   AXIS I Major Depressive disorder, recurrent, severe, without mention of psychotic behavior-improving   AXIS II No diagnosis  AXIS III Past Medical History  Diagnosis Date  . Overweight   . Depression    . Acid reflux      AXIS IV economic problems, educational problems, housing problems, occupational problems, other psychosocial or environmental problems, problems related to legal system/crime, problems related to social environment, problems with access to health care services and problems with primary support group  AXIS V  55   Treatment Plan/Recommendations:  PLAN:  1. Affirm with the patient that the medications are taken as ordered. Patient  expressed understanding of how their medications were to be used.  2. Continue the following psychiatric medications as written prior to this appointment/ with the following changes:  A) continue effexor 150 plus  tablets b) continue Trazodone 150 mg 1 tablet at sleep time, depending upon his shift work.  3. Therapy: brief supportive therapy provided. More than 50% of the visit was spent on individual therapy/counseling. Patient is going with possible therapy as a couple and they're working in the relationship. 4. Risks and benefits, side effects and alternatives discussed with patient, she was given an opportunity to ask questions about his/her medication, illness, and treatment. All current psychiatric medications have been reviewed and discussed with the patient and adjusted as clinically appropriate. The patient has been provided an accurate and updated list of the medications being  now prescribed.  5. Patient told to call clinic if any problems occur. Patient advised to go to  ER  if she should develop SI/HI, side effects, or if symptoms worsen. Has crisis numbers to call if needed.   6. No labs warranted at this time.  7. The patient was encouraged to keep all PCP and specialty clinic appointments.  8. Patient was instructed to return to clinic in 1-2 months.  9. The patient was advised to call and cancel their mental health appointment within 24 hours of the appointment, if they are unable to keep the appointment, as well as the three no  show and termination from clinic policy. 10. The patient expressed understanding of the plan and agrees with the above.   Thresa RossNADEEM Aalina Brege, M.D.  07/01/2014 2:08 PM

## 2014-08-31 NOTE — Discharge Summary (Signed)
PATIENT NAME:  Eric [name], Eric [name] MR#:  [id number] DATE OF BIRTH:  1975/07/08  DATE OF ADMISSION:  02/22/2012 DATE OF DISCHARGE:  02/24/2012  ADMITTING PHYSICIAN: Dr. Enedina FinnerSona Patel  DISCHARGING PHYSICIAN:  Dr. Enid Baasadhika Cniyah Sproull PRIMARY CARE PHYSICIAN:  Dr. Dayton MartesAron  CONSULTATIONS IN THE HOSPITAL:  None.  DISCHARGE DIAGNOSES:  1. Aseptic meningitis.  2. Depression and anxiety.  3. Gastroesophageal reflux disease.   HOME MEDICATIONS: 1. Celexa 20 mg p.o. daily.  2. Xanax 0.25 mg p.o. 3 times a day as needed.  3. Protonix 40 mg p.o. daily.  4. Ambien 10 mg at bedtime.  5. Ibuprofen 400 mg every six hours as needed for headache.   DISCHARGE DIET: Regular diet.   DISCHARGE ACTIVITY: As tolerated.     FOLLOW-UP INSTRUCTIONS:  Primary care physician followup in two weeks.   LABORATORY, DIAGNOSTIC, AND RADIOLOGICAL DATA: WBC 7.6, hemoglobin 14.3, hematocrit 40.3, platelet count 238, sodium 139, potassium 4.1, chloride 104, bicarbonate 27, BUN 12, creatinine 1.04, glucose 94, calcium 8.8.   ALT 38, AST 29, alkaline phosphatase 80, total bilirubin 1.9, albumin 4.2. WBC on admission was 18. INR 0.9. TSH 0.89. Cardiac enzymes negative. Urinalysis with 1+ leukocyte esterase, 3 WBCs, and no bacteria. Alcohol level was negative. Salicylate and acetaminophen levels in serum negative. Urine tox screen is negative.   CT of the head without contrast showing subtle lucency along the right occipital region, could be normal white matter, however, MRI was recommended. MRI of the brain was done with and without contrast showing no areas of any restricted effusion to suggest acute infarct. No mass or mass effect or midline shift or abnormal areas of enhancement seen. Overall unremarkable MRI of the brain.   Chest x-ray showing mild prominence of the superior mediastinum, likely low lung volumes, no acute cardiopulmonary disease.  CSF cultures showing no growth in 36 hours.  CSF study: The third tube showed about 500  RBCs and 5 WBCs with lymphocyte predominance. HSV PCR is not sent.  BRIEF HOSPITAL COURSE: Eric [name] is a 39 year old male with no significant past medical history other than gastroesophageal reflux disease and depression and anxiety who was brought in secondary to having a headache, fever, nausea, and feeling lethargic.  He did have a lumbar puncture done in the ER which showed a few WBCs with lymphocytic predominance and  white count of 18,000.  He was admitted for possible aseptic meningitis. Possible aseptic meningitis: He was initially covered with vancomycin and Rocephin while waiting for CSF cultures. He was also on acyclovir while in the hospital. His headache improved and he did not have any neck stiffness or photophobia into day two of hospitalization.  HSV PCR was not sent. However, he had no genital lesions or other trigger factors that made me suspicious for HSV so acyclovir was stopped and once the CSF cultures were negative his Rocephin and vancomycin were stopped. The patient was able to ambulate well.  He had a mild frontal headache, likely a tension headache or headache related to lumbar puncture on the day of discharge, which improved with ibuprofen.  He is being discharged in stable condition.  All his other home medications were restarted and the patient was advised to follow up with his primary care physician in the next couple of weeks.  He did also have an MRI of the brain which was completely normal. His course has been uneventful in the hospital.   DISCHARGE CONDITION: Stable.   DISCHARGE DISPOSITION: Home.   TIME SPENT  ON DISCHARGE: 45 minutes.  ____________________________ Enid Baas, MD [id number] D: 02/25/2012 11:49:43 ET T: 02/25/2012 12:01:46 ET JOB#: [id number]  cc: Enid Baas, MD, <Dictator> Bryn Gulling. Dayton Martes, MD Enid Baas MD ELECTRONICALLY SIGNED 03/04/2012 14:12

## 2014-08-31 NOTE — H&P (Signed)
PATIENT NAME:  Eric [name], Eric [name] MR#:  [id number] DATE OF BIRTH:  [birth year]  DATE OF ADMISSION:  02/22/2012  PRIMARY CARE PHYSICIAN:  Dr. Ruthe Mannanalia Aron.   CHIEF COMPLAINT: Headache, nausea, vomiting, and fever today.   HISTORY OF PRESENT ILLNESS: 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 He was relatively doing well yesterday, feeling a little weak, however, woke up this morning not feeling well, had nausea, vomiting, and low-grade fever at home. Thereafter, he started having diarrhea, felt very lethargic and groggy. His fellow lieutenant officer brought him to the Emergency Room and the patient was found to have a temperature of 99.6. He was tachycardic with heart rate in the 90s. CT of the head showed a subtle lucency along the right occipital region. Hence, he was sent to get MRI done. MRI of the brain was negative. Lab work showed WBC of 18,000. A lumbar puncture was decided to be done. CSF results so far suggestive of viral meningitis, suspected aseptic viral. However, he does have some few white cells and neutrophils, about 57%. He was given Rocephin, vancomycin and acyclovir in the Emergency Room. He has also received some Tylenol. The patient is currently getting IV fluids. He appears to be alert, a little slow to talk to, groggy, but is waking up and trying to talk. His main complaint is headache. He is being admitted for further evaluation and management.   PAST MEDICAL HISTORY:  1. Anxiety/depression. 2. Gastroesophageal reflux disease.  3. Appendectomy.   MEDICATIONS:  1. Zolpidem 10 mg daily.  2. Protonix 40 mg daily.  3. Citalopram 20 mg daily.  4. Lopressor 1.25 mg 3 times a day.   SOCIAL HISTORY: He is married and works as a Emergency planning/management officerpolice officer. Nonsmoker. Nonalcoholic. Denies any other drug use.    FAMILY HISTORY: Positive for hypertension.   REVIEW OF SYSTEMS: CONSTITUTIONAL: Positive for fever, fatigue, weakness. EYES: No blurred or double vision. ENT: No tinnitus, ear pain, hearing loss. RESPIRATORY: No cough, wheeze, hemoptysis. CARDIOVASCULAR: No chest pain, orthopnea, or edema. GASTROINTESTINAL: Positive for nausea, vomiting, and diarrhea. No abdominal pain. GU: No dysuria or hematuria. ENDOCRINE: No polyuria or nocturia. HEMATOLOGY: No anemia or easy bruising. SKIN: No acne or rash. MUSCULOSKELETAL: No arthritis. NEUROLOGIC: No cerebrovascular accident or transient ischemic attack. PSYCH: No anxiety or depression. PSYCHIATRIC: The patient is lethargic. However, answers most of the questions appropriately.  All other systems reviewed and negative.   PHYSICAL EXAMINATION:  GENERAL: The patient is a little lethargic, groggy, however, wakens up and answers almost all the questions appropriately.   VITAL SIGNS: Temperature 99.3, pulse 99, blood pressure 117/70, sats are 99% on room air.   HEENT: Atraumatic, normocephalic. Pupils are equal, round, and reactive to light and accommodation. Extraocular movements intact. Oral mucosa is dry.   NECK: Supple. No JVD. No carotid bruit. No rigidity.   RESPIRATORY: Clear to auscultation bilaterally. No rales, rhonchi, respiratory distress, or labored breathing.   HEART: Both the heart sounds are normal. Rhythm is regular. Rate is tachycardic. No murmur heard. PMI not lateralized. Chest is nontender.   EXTREMITIES: Good pedal pulses, good femoral pulses. No lower extremity edema.   ABDOMEN: Soft, benign, and nontender. No organomegaly. Positive bowel sounds.   NEUROLOGIC: The patient is lethargic, however, answers appropriate questions. No neck rigidity. No focal neuro deficits. Pupils  are reactive bilaterally to light. Reflexes are 1+ in both upper and lower extremities.   SKIN: Warm and dry. No rashes.   PSYCHIATRIC: The patient is  lethargic, however answered questions appropriately.   LABORATORY, RADIOLOGICAL AND DIAGNOSTIC DATA: CSF tube one: Pink, hazy. RBC 1,759, white cells are 21 of which 57% are neutrophils, 42% are lymphocytes. Protein CSF is 35. Glucose 58. CSF tube three: Clear RBC 493, neutrophils 5 of which 71% are lymphocytes. Gram stain CSF showed no organisms seen, many red cells see, few white blood cells seen. MRI of the brain is unremarkable. Chest x-ray: No cardiopulmonary abnormality. CT of the head without contrast: Subtle lucency in the right occipital region. White count is 18,000. The rest of the chemistries and CBC within normal limits. Magnesium is 1.4. PT-INR 12.4 and 0.9. Troponin is 0.02. EKG shows sinus rhythm.   ASSESSMENT AND PLAN: 39 year old Eric [name] with history of anxiety, depression, and gastroesophageal reflux disease, comes in with:  1. Systemic inflammatory response syndrome due to suspected meningitis, likely viral/aseptic, presented with one day of fever, headache, nausea, lethargy, vomiting and diarrhea. He has bilateral frontal headache. CSF has few neutrophils, but more favors viral with a traumatic tap. CSF glucose and protein are normal. Gram stain: No organisms, few RBCs and WBC. MRI of the brain is negative. Will admit the patient to the medical floor. Continue IV fluids. Encourage p.o. liquids and fluids once vomiting subsides p.r.n. Zofran. Tylenol around the clock for headache and fever. Will continue vancomycin, acyclovir and Rocephin until CSF cultures are negative and fever subsides.  2. Leukocytosis, appears reactive in the setting of meningitis.  3. Dehydration from nausea, vomiting, diarrhea, and poor p.o. intake. Will continue IV fluids. Encourage p.o. liquids once nausea and vomiting subsides. P.r.n. Zofran.  4. Depression/anxiety. Will resume home medications once the patient able to keep p.o. down.   The above was discussed with the patient's wife and other family  members. Further work-up according to the patient's clinical course.   TIME SPENT: 50 minutes.  ____________________________ Wylie Hail Allena Katz, MD sap:ap D: 02/22/2012 21:39:08 ET                  T: 02/23/2012 08:20:27 ET JOB#: [id number] cc: Takari Duncombe A. Allena Katz, MD, <Dictator> Bryn Gulling. Dayton Martes, MD Willow Ora MD ELECTRONICALLY SIGNED 02/26/2012 1:36

## 2014-09-03 NOTE — Consult Note (Signed)
PATIENT NAME:  Eric [name], Eric [name] MR#:  [id number] DATE OF BIRTH:  1976-03-07  DATE OF CONSULTATION:  08/05/2012  REFERRING PHYSICIAN:  Chiquita Loth, MD CONSULTING PHYSICIAN:  Ardeen Fillers. Garnetta Buddy, MD  REASON FOR CONSULTATION: Suicidal ideation with intentional overdose, depression and hopelessness.   HISTORY OF PRESENT ILLNESS: The patient is a 39 year old single white male who presented to the ER after he attempted suicide by taking an overdose of sleeping medications. He reported that he was in a bad mood and was talking crazy to his brother who brought him to the Emergency Room. The patient stated that he resigned voluntarily or he was fired from the [employer] where he has been working for the past 8 years. 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 He stated that he has a 41 year old son. He stated that he also has a stepdaughter who is currently 5 years old. The patient reported that he is also having issues with his brother who is currently living with him. He reported that his brother was seeing a married woman for the past 5 to 6 months. However, the woman's husband complained against them and since they both live together the police got suspicious about the patient. The patient reported that he ignored this complaint and did not say anything initially and once things got out of control he realized the situation. However, since this was unruly for him they decided that this should not be the behavior of an Technical sales engineer. 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 The patient reported that he was becoming very depressed and he decided to take extra pills of his trazodone, which was recently prescribed by his psychiatrist to help him sleep. He reported that he has recently stopped taking Ambien because it was becoming too strong and he would get  up and eat in the middle of the night. He has started seeing a psychiatrist for the past 3 months. He stated that he has been seeing a psychiatrist in [facility]. However, collateral information from the [facility] Controlled Drug Registry stated that he has been following at [facility] in [place], [place]. The patient reported that they have already stopped the Xanax, which was prescribed to him in the past. He stated that they have recently switched him to Effexor and he is feeling somewhat better on the medication. He stated that he is not taking any more Xanax at this time. The patient appears somewhat circumstantial during the interview and was not forthcoming with information. He remains depressed, hopeless and helpless with inability to contract for safety at this time.   PAST PSYCHIATRIC HISTORY: The patient reported that he has been feeling depressed and has been treated with depression medication since he started seeing a psychiatrist. They have started him on Effexor and was tried on Xanax and Ambien in the past. He reported that he has not attempted suicide in the past.   FAMILY HISTORY: The patient denied any history of depression in the family.   SUBSTANCE ABUSE HISTORY: He reported that he does not use any drugs or alcohol at this time.   CURRENT HOME MEDICATIONS:   1.  Effexor XR 37.5 mg and 75 mg in the morning.  2.  Trazodone 150 mg at bedtime.   ALLERGIES: No known drug allergies.  PAST MEDICAL HISTORY: The patient currently denied having any medical issues at this time.   PAST SURGICAL HISTORY: He has history of labyrinth surgery in the past as well as history of surgery in the ear.   ANCILLARY DATA: Temperature 97.8, pulse 73, respirations 20 and blood pressure 103/54.  LABORATORY DATA:  Glucose 91, BUN 10, creatinine 1.03, sodium 139, potassium 3.8, chloride 105 bicarbonate 28, anion gap 6, osmolality 276 and calcium 8.7. Blood alcohol less than 3.  Protein 7.8, albumin 4.3, bilirubin 1.0, alkaline phosphatase 76, AST 38 and ALT 32. Urine drug screen was negative. WBC 10.2, RBC 4.7, hemoglobin 14.9, hematocrit 42.1 and MCH 31.2.  REVIEW OF SYSTEMS:   CONSTITUTIONAL: No fever or chills. No weight changes.  EYES: No double or blurred vision.  RESPIRATORY: No shortness of breath or cough.  CARDIOVASCULAR: No chest pain or orthopnea.  GASTROINTESTINAL: No abdominal pain, nausea, vomiting or diarrhea.  GENITOURINARY: No incontinence or frequency.  ENDOCRINE: No heat or cold intolerance.  LYMPHATIC: No anemia or easy bruising.  INTEGUMENTARY: No acne or rash.  MUSCULOSKELETAL: No muscle or joint pain.  NEUROLOGIC: No tingling or weakness.   MENTAL STATUS EXAMINATION: The patient is a moderately built male who was lying in the bed. He maintained fair eye contact. His speech was low in tone and volume. Mood was depressed. Affect was depressed and anxious. Thought process was logical, goal-directed. Thought content was nondelusional. No perceptual disturbances were noted. He was unable to contract for safety and has attempted suicide recently. He demonstrated poor insight and judgment.   DIAGNOSTIC IMPRESSION:  AXIS I: Major depressive disorder, recurrent, severe without psychotic features.   AXIS II: None.   AXIS III: Please review the medical history.   TREATMENT PLAN: 1.  The patient is under involuntary commitment and he will be placed to the inpatient behavioral health unit.  2.  He will be started back on his venlafaxine 150 mg in the morning.  3.  Treatment team will evaluate the patient in the behavioral health unit and will adjust his medications. We will not start him on trazodone at this time as he has recently overdosed on the medication.  Group and milieu therapy.  Thank you for allowing me to participate in the care of this patient.  ____________________________ Ardeen FillersUzma S. Garnetta BuddyFaheem, MD usf:sb D: 08/05/2012 13:52:36 ET      T: 08/05/2012 14:11:35 ET        JOB#: [id number] cc: Ardeen FillersUzma S. Garnetta BuddyFaheem, MD, <Dictator> Rhunette CroftUZMA S Thais Silberstein MD ELECTRONICALLY SIGNED 08/14/2012 8:50

## 2014-09-03 NOTE — Discharge Summary (Signed)
PATIENT NAME:  Eric MayhewMERY, Eric [name] MR#:  [id number] DATE OF BIRTH:  September 04, 1975  DATE OF ADMISSION:  08/05/2012 DATE OF DISCHARGE:  08/11/2012  HOSPITAL COURSE: See dictated history and physical for details of admission. This 39 year old man was admitted to the hospital after having taken an overdose of medication. He complained of severe depression in the context of multiple stresses, including separation from his wife and losing his job. In the hospital, the patient denied any acute suicidal plan or intent and did not engage in any suicidal behavior. His affect remained dysphoric, and he endorsed depressive feelings during his hospital stay but was cooperative with treatment. He attended groups and engaged appropriately in daily individual and group psychotherapy. Medication management was done by increasing his dose of Effexor to 225 mg a day. Medical problems were managed by continuing his Protonix 40 mg a day. He was given Ambien 10 mg at night to assist with sleep. By the time of discharge, the patient was stating that his mood was still feeling down, but he no longer had any suicidal thoughts at all. He felt optimistic and upbeat and was able to think about appropriate plans for the future. We have made a follow-up appointment for [facility] at [facility] after discharge.   MENTAL STATUS EXAM: Neatly dressed and groomed man who looks his stated age, cooperative with the interview. Good eye contact, normal psychomotor activity. Speech normal in rate, tone and volume. Affect blunted. Mood stated as being still a little bit down. Thoughts are lucid. No evidence of loosening of associations or delusions. Denies auditory or visual hallucinations. Denies suicidal or homicidal intent or plan. Insight and judgment are improved.  Intelligence normal.   DISCHARGE MEDICATIONS: Venlafaxine extended-release 225 mg per day, Protonix 40 mg per day, Ambien 10 mg at night, hydroxyzine 50 mg q. 6 hours p.r.n. for anxiety.    LABORATORY RESULTS: Admission labs showed alcohol not detected. TSH normal at 2.0. Chemistries were unremarkable. CBC was normal. Drug screen negative.   DISPOSITION: Discharge home.   FOLLOWUP:  With [facility] as noted above.    DIAGNOSIS, PRINCIPAL AND PRIMARY:  AXIS I: Major depressive episode, single, severe.   SECONDARY DIAGNOSES: AXIS I: No further.  AXIS II: No diagnosis.   AXIS III:  1. History of obesity, status post lap band operation. 2. Gastric reflux symptoms.   AXIS IV: Severe from loss of job and separation from wife.   AXIS V: Functioning at time of discharge 54.  ____________________________ Eric AmelJohn T. Clapacs, MD jtc:cb D: 08/11/2012 17:18:42 ET T: 08/11/2012 23:00:35 ET JOB#: [id number]  cc: Eric AmelJohn T. Clapacs, MD, <Dictator> Eric AmelJOHN T CLAPACS MD ELECTRONICALLY SIGNED 08/12/2012 0:22

## 2014-09-03 NOTE — H&P (Signed)
DATE OF BIRTH:  November 19, 1975  DATE OF ADMISSION:  08/05/2012  IDENTIFYING INFORMATION AND CHIEF COMPLAINT: This is a 39 year old man brought to the hospital after being found having taken an overdose of medicine at home. His chief complaint is "depression, I guess."   HISTORY OF PRESENT ILLNESS:  Information obtained from the patient and the chart. The patient reports that he has been feeling depressed for several months, ever since being separated from his wife, but things have been getting particularly worse over the last couple of weeks. 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 Over the last few weeks to months, his mood has been depressed, he has been fatigued, loss of interest in usual activities, feelings of hopelessness at times. Denies any psychotic symptoms. Denies that he had been having suicidal ideation. Denies homicidal ideation. Earlier this week, he lost his job under circumstances I am still not completely clear about. There was some kind of situation in which his brother was fooling around with a married woman, and somehow or another, the patient ended up losing his job over this. It does not make a great deal of sense, but the story has been repeated several times. The patient says he does not remember taking an excessive amount of medicine last night, but believes that he must have done it. He cannot give any reason why for it, and says that he had not been having thoughts of killing himself. He has been seeing a psychiatrist in [facility] apparently, and was prescribed Effexor, which he has been taking, but only for the last week or two.   PAST PSYCHIATRIC HISTORY:  No previous psychiatric hospitalizations. No previous suicide attempts. Just recently started seeing an outpatient psychiatrist for treatment of depression. No history of psychosis. No history of substance abuse identified in the past.   SOCIAL HISTORY:  The patient is  separated from his wife. He has 2 young children, who he stays in some contact with. Since being separated from his wife, he has been living with his brother. 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 This is extremely traumatic and stressful for him.   PAST MEDICAL HISTORY:  He does not know of any significant ongoing medical problems.   FAMILY HISTORY:  Does not know of any family history of mental illness.   SUBSTANCE ABUSE HISTORY:   Denies any history of alcohol or drug dependence now or in the past.   MEDICATIONS ON ADMISSION:  Effexor-XR 112.5 mg per day, trazodone 150 mg at night.   ALLERGIES: No known drug allergies.   REVIEW OF SYSTEMS:  Depressed mood, fatigue, loss of interest. Decreased energy. Passive suicidal thoughts, with no acute intent or plan. Denies hallucinations. No acute physical symptoms right now.   MENTAL STATUS EXAMINATION:  Passively interactive.  Eye contact poor. Psychomotor activity slow. Speech quiet, difficult to understand at times, and decreased in amount. Affect flat. Mood stated as being depressed. Thoughts are at times a little bit scattered and hard to follow. Not obviously psychotic. No obviously delusional statements. Denies auditory or visual hallucinations. Denies any current suicidal or homicidal ideation. Judgment and insight recently seem to probably have been somewhat impaired. Baseline intelligence normal.   PHYSICAL EXAMINATION: GENERAL:  The patient is 65 inches tall, weighs 172 pounds.  SKIN:  The patient has no acute skin lesions.  HEENT: Pupils equal and reactive. Face symmetric. Oral mucosa normal.  BACK AND NECK: Nontender.  MUSCULOSKELETAL: Full range of motion at all extremities. Gait normal. Strength and reflexes normal throughout.  NEUROLOGIC:  Cranial nerves symmetric and normal.  LUNGS:  Clear, with no wheezes.  HEART:  Regular rate and rhythm.  ABDOMEN:  Soft, nontender. Normal bowel  sounds.   VITAL SIGNS:  Blood pressure 124/77, respirations 18, pulse 80, temperature 98 degrees.   LABORATORY RESULTS:  Drug screen is negative. Urinalysis normal. A CT was done of his chest, apparently because a chest x-ray looked like he might have had free air under the diaphragm, but it appears that was just the results of his old lap band surgery. Salicylates and acetaminophen negative. Thyroid stimulating hormone normal. Alcohol undetected. Chemistry and blood counts normal.   ASSESSMENT:  A 39 year old man with depression, overdose, possible suicidal behavior, poor functioning, major stress. Needs hospitalization for evaluation and stabilization.   TREATMENT PLAN:  Admit to Psychiatry. Increase Effexor to 150 mg a day for treatment of depression. Engage the patient in groups and individual therapy. Try and get collateral history, work on monitoring for improved mood, work on ensuring outpatient treatment plan.   DIAGNOSIS PRINCIPLE AND PRIMARY:  AXIS I:  Major depression, single, severe.   SECONDARY DIAGNOSES: AXIS I:  No further.   AXIS II:  Deferred.   AXIS III:  History of obesity, status post lap band surgery.   AXIS IV:  Severe from loss of his job and loss of his relationship with his wife.   AXIS V:  Functioning at time of admission is 30.     ____________________________ Audery AmelJohn T. Gerrard Crystal, MD jtc:mr D: 08/05/2012 19:05:23 ET T: 08/05/2012 20:35:08 ET JOB#: [id number]  cc: Audery AmelJohn T. Brylyn Novakovich, MD, <Dictator> Audery AmelJOHN T Lowana Hable MD ELECTRONICALLY SIGNED 08/06/2012 9:44

## 2014-09-10 ENCOUNTER — Other Ambulatory Visit: Payer: Self-pay | Admitting: *Deleted

## 2014-09-10 MED ORDER — PANTOPRAZOLE SODIUM 40 MG PO TBEC: 40.0000 mg | DELAYED_RELEASE_TABLET | Freq: Every day | ORAL | Status: DC

## 2014-09-13 ENCOUNTER — Other Ambulatory Visit (HOSPITAL_COMMUNITY): Payer: Self-pay | Admitting: *Deleted

## 2014-09-13 DIAGNOSIS — F32A Depression, unspecified: Secondary | ICD-10-CM

## 2014-09-13 DIAGNOSIS — F329 Major depressive disorder, single episode, unspecified: Secondary | ICD-10-CM

## 2014-09-13 MED ORDER — TRAZODONE HCL 100 MG PO TABS: 150.0000 mg | ORAL_TABLET | Freq: Every evening | ORAL | Status: DC | PRN

## 2014-09-13 MED ORDER — TRAZODONE HCL 100 MG PO TABS: 150.0000 mg | ORAL_TABLET | Freq: Every day | ORAL | Status: DC

## 2014-09-13 MED ORDER — VENLAFAXINE HCL ER 75 MG PO CP24: 75.0000 mg | ORAL_CAPSULE | Freq: Every day | ORAL | Status: DC

## 2014-09-13 MED ORDER — VENLAFAXINE HCL ER 150 MG PO CP24: 150.0000 mg | ORAL_CAPSULE | Freq: Every day | ORAL | Status: DC

## 2014-09-13 NOTE — Telephone Encounter (Signed)
Received fax from pharmacy for refills for Trazodone 100mg , Effexor 150mg , and Effexor 75mg . Per Dr. Gilmore LarocheAkhtar, pt is authorized for Trazodone 100mg , Qty 30, Effexor 150mg , Qty 30 and Effexor 75mg , Qty 30. Prescriptions were sent to pharmacy. Pt has a f/u appt on 5/19. Called and informed pt of prescription status.  Pt states and shows understanding.

## 2014-09-30 ENCOUNTER — Ambulatory Visit (HOSPITAL_COMMUNITY): Payer: Self-pay | Admitting: Psychiatry

## 2014-09-30 ENCOUNTER — Encounter (HOSPITAL_COMMUNITY): Payer: Self-pay | Admitting: Psychiatry

## 2014-10-25 ENCOUNTER — Other Ambulatory Visit: Payer: Self-pay

## 2014-10-25 ENCOUNTER — Ambulatory Visit (INDEPENDENT_AMBULATORY_CARE_PROVIDER_SITE_OTHER): Payer: BLUE CROSS/BLUE SHIELD | Admitting: Family Medicine

## 2014-10-25 ENCOUNTER — Encounter: Payer: Self-pay | Admitting: Family Medicine

## 2014-10-25 VITALS — BP 100/62 | HR 83 | Temp 97.6°F | Wt 190.5 lb

## 2014-10-25 DIAGNOSIS — F332 Major depressive disorder, recurrent severe without psychotic features: Secondary | ICD-10-CM

## 2014-10-25 DIAGNOSIS — F32A Depression, unspecified: Secondary | ICD-10-CM

## 2014-10-25 DIAGNOSIS — F329 Major depressive disorder, single episode, unspecified: Secondary | ICD-10-CM

## 2014-10-25 MED ORDER — VENLAFAXINE HCL ER 75 MG PO CP24: 75.0000 mg | ORAL_CAPSULE | Freq: Every day | ORAL | Status: DC

## 2014-10-25 MED ORDER — VENLAFAXINE HCL ER 150 MG PO CP24: 150.0000 mg | ORAL_CAPSULE | Freq: Every day | ORAL | Status: DC

## 2014-10-25 NOTE — Progress Notes (Signed)
Pre visit review using our clinic review tool, if applicable. No additional management support is needed unless otherwise documented below in the visit note. 

## 2014-10-25 NOTE — Progress Notes (Signed)
Subjective:   Patient ID: Eric [name], male    DOB: 1975-07-19, 39 y.o.   MRN: [id number]  Eric [name] is a pleasant 39 y.o. year old male who presents to clinic today with Follow-up and Referral  on 10/25/2014  HPI:  Needs referral to [facility].  Has been seeing psychiatrists with [facility]- last seen on 07/01/14.  Note reviewed.  Per pt, was discharged from [facility] because he missed appointment for a court date.  Currently taking Effexor 150 mg plus 75 mg daily along with Trazodone 150 mg nightly. He feels rxs are working well.  Denies feeling depressed or anxious.  Sleeping ok.   Current Outpatient Prescriptions on File Prior to Visit  Medication Sig Dispense Refill  . pantoprazole (PROTONIX) 40 MG tablet Take 1 tablet (40 mg total) by mouth daily. 30 tablet 3  . traZODone (DESYREL) 100 MG tablet Take 1.5 tablets (150 mg total) by mouth at bedtime as needed for sleep. 30 tablet 0  . venlafaxine XR (EFFEXOR-XR) 150 MG 24 hr capsule Take 1 capsule (150 mg total) by mouth daily. 30 capsule 0  . venlafaxine XR (EFFEXOR-XR) 75 MG 24 hr capsule Take 1 capsule (75 mg total) by mouth daily. Take with 150 mg. Total of 187.5 mg. 30 capsule 0   No current facility-administered medications on file prior to visit.    No Known Allergies  Past Medical History  Diagnosis Date  . Overweight(278.02)   . Depression   . Acid reflux     Past Surgical History  Procedure Laterality Date  . Appendectomy  1988  . Esophagogastroduodenoscopy  05/26/2004    Dr. Arlyce Dice  . Laparoscopic gastric banding  08/2010    Family History  Problem Relation Age of Onset  . Heart disease Father   . Heart attack Father 60  . Diabetes Mellitus II Father   . Diabetes Mellitus II Mother   . Hepatitis B Mother     History   Social History  . Marital Status: Married    Spouse Name: N/A  . Number of Children: 2  . Years of Education: N/A   Occupational History  . POLICE  OFFICER    Social History Main Topics  . Smoking status: Never Smoker   . Smokeless tobacco: Current User    Types: Chew     Comment: Pouches-3-4 pouches/day for 6-8 years-in the process of quitting  . Alcohol Use: Yes     Comment: Twice a year.  . Drug Use: No     Comment: Caffiene: None  . Sexual Activity:    Partners: Female     Comment: Patient reports some decreased libido   Other Topics Concern  . Not on file   Social History Narrative   The PMH, PSH, Social History, Family History, Medications, and allergies have been reviewed in [facility], and have been updated if relevant.   Review of Systems  HENT: Negative.   Cardiovascular: Negative.   Gastrointestinal: Negative.   Psychiatric/Behavioral: Negative for suicidal ideas, hallucinations, behavioral problems, confusion, sleep disturbance, self-injury, dysphoric mood, decreased concentration and agitation. The patient is not nervous/anxious and is not hyperactive.   All other systems reviewed and are negative.      Objective:    BP 100/62 mmHg  Pulse 83  Temp(Src) 97.6 F (36.4 C) (Oral)  Wt 190 lb 8 oz (86.41 kg)  SpO2 97%   Physical Exam  Constitutional: He is oriented to person, place, and time. He  appears well-developed and well-nourished. No distress.  HENT:  Head: Normocephalic.  Eyes: Conjunctivae are normal.  Pulmonary/Chest: Effort normal.  Musculoskeletal: Normal range of motion.  Neurological: He is alert and oriented to person, place, and time. No cranial nerve deficit.  Skin: Skin is warm and dry.  Psychiatric: He has a normal mood and affect. His behavior is normal. Judgment and thought content normal.  Nursing note and vitals reviewed.         Assessment & Plan:   Major depressive disorder, recurrent, severe without psychotic features - Plan: Ambulatory referral to Psychiatry No Follow-up on file.

## 2014-10-25 NOTE — Assessment & Plan Note (Signed)
>  15 minutes spent in face to face time with patient, >50% spent in counselling or coordination of care.  Symptoms well controlled on current rxs.  Referral placed to get him established with psychiatry again. The patient indicates understanding of these issues and agrees with the plan.

## 2014-10-25 NOTE — Telephone Encounter (Signed)
Pt left note requesting refill venlafaxine XR 150 mg and 75 mg to [facility] [address]. Pt was seen earlier today and psychiatry referral done. Is is OK to refill?

## 2014-10-25 NOTE — Patient Instructions (Signed)
Great to see you. Please stop by to see Marion on your way out.   

## 2014-11-04 ENCOUNTER — Ambulatory Visit
Admission: EM | Admit: 2014-11-04 | Discharge: 2014-11-04 | Disposition: A | Discharge status: Home / Self Care | Payer: BLUE CROSS/BLUE SHIELD | Attending: Internal Medicine | Admitting: Internal Medicine

## 2014-11-04 DIAGNOSIS — T700XXA Otitic barotrauma, initial encounter: Secondary | ICD-10-CM | POA: Diagnosis not present

## 2014-11-04 MED ORDER — OXYCODONE-ACETAMINOPHEN 5-325 MG PO TABS: 1.0000 | ORAL_TABLET | ORAL | Status: DC | PRN

## 2014-11-04 MED ORDER — KETOROLAC TROMETHAMINE 60 MG/2ML IM SOLN
60.0000 mg | Freq: Once | INTRAMUSCULAR | Status: AC
  Administered 2014-11-04: 60 mg via INTRAMUSCULAR

## 2014-11-04 MED ORDER — AMOXICILLIN-POT CLAVULANATE 875-125 MG PO TABS: 1.0000 | ORAL_TABLET | Freq: Two times a day (BID) | ORAL | Status: AC

## 2014-11-04 NOTE — Discharge Instructions (Signed)
Prescription for augmentin (antibiotic) was sent to the pharmacy. Prescription for percocet for pain (small amount) was printed. Followup with ENT in the next few days to discuss further management of barotrauma.  Ear Barotrauma  Ear barotrauma is ear pain or damage caused by a pressure difference between the inside and outside of the eardrum.  CAUSES   Being too close to an explosion or blast.  Receiving a blow to your ear.  Coming to the surface too rapidly after scuba diving.  Flying in an airplane.  Going to high altitudes within 24 hours after diving.  Undergoing rapid depressurization after working in a pressurized chamber. SYMPTOMS   Dizziness that feels like a spinning, rocking, or tumbling sensation.  Loss of balance.  Nausea.  Ringing in your ear(s) (tinnitus).  Ear pain.  Bleeding from the ear(s).  Sudden partial or complete loss of hearing.  Headache. DIAGNOSIS  Your caregiver may know what is wrong based on your history. Your caregiver may also use blood work, X-rays, and other tests to make sure that other injuries did not happen. This may be more important if your injury happened while scuba diving or if you traveled to high altitude after diving. TREATMENT  Treatment depends on how bad the injury to your ear(s) is. For example, if there is a risk for infection, antibiotic medicine may be recommended. Your caregiver will make these decisions based on what was found during your exam. With severe injuries, you may need to see a specialist. HOME CARE INSTRUCTIONS   Do not travel to high altitudes, work in a pressurized cabin or room, or scuba dive until your caregiver approves.  Keep your ears dry. Use the corner of a towel to get water out of the ears after showering or bathing.  Only take over-the-counter or prescription medicines for pain, discomfort, or fever as directed by your caregiver.  See your caregiver as recommended. SEEK MEDICAL CARE IF:    You have pain that is not relieved by medicine or is getting worse.  You have a fever.  You have any new or unusual discharge from the ear.  The outer ear becomes red or swollen or there is swelling behind your earlobe.  You have problems that may be related to the medicine you are taking. MAKE SURE YOU:   Understand these instructions.  Will watch your condition.  Will get help right away if you are not doing well or get worse. Document Released: 04/02/2006 Document Revised: 07/23/2011 Document Reviewed: 04/18/2007 [facility] Patient Information 2015 [place], [place]. This information is not intended to replace advice given to you by your health care provider. Make sure you discuss any questions you have with your health care provider.

## 2014-11-04 NOTE — ED Notes (Signed)
Pt states "I think I have put a hole in my ear drum again, from swimming"

## 2014-11-06 NOTE — ED Provider Notes (Signed)
CSN: [id number]     Arrival date & time 11/04/14  1828 History   First MD Initiated Contact with Patient 11/04/14 1924     Chief Complaint  Patient presents with  . 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 Jumped into the pool today and had sudden onset of severe pain in R ear.  No drainage.  Decreased hearing.  No loss of consciousness.  Able to walk into urgent care independently.  Past Medical History  Diagnosis Date  . Overweight(278.02)   . Depression   . Acid reflux    Past Surgical History  Procedure Laterality Date  . Appendectomy  1988  . Esophagogastroduodenoscopy  05/26/2004    Dr. Arlyce Dice  . Laparoscopic gastric banding  08/2010   Family History  Problem Relation Age of Onset  . Heart disease Father   . Heart attack Father 34  . Diabetes Mellitus II Father   . Diabetes Mellitus II Mother   . Hepatitis B Mother    History  Substance Use Topics  . Smoking status: Never Smoker   . Smokeless tobacco: Current User    Types: Chew     Comment: Pouches-3-4 pouches/day for 6-8 years-in the process of quitting  . Alcohol Use: Yes     Comment: Twice a year.    Review of Systems  All other systems reviewed and are negative.   Allergies  Review of patient's allergies indicates no known allergies.  Home Medications   Prior to Admission medications   Medication Sig Start Date End Date Taking? Authorizing Provider  pantoprazole (PROTONIX) 40 MG tablet Take 1 tablet (40 mg total) by mouth daily. 09/10/14  Yes Dianne Dun, MD  traZODone (DESYREL) 100 MG tablet Take 1.5 tablets (150 mg total) by mouth at bedtime as needed for sleep. 09/13/14  Yes Thresa Ross, MD  venlafaxine XR (EFFEXOR-XR) 150 MG 24 hr capsule Take 1 capsule (150 mg total) by mouth daily. 10/25/14  Yes Dianne Dun, MD  venlafaxine XR (EFFEXOR-XR) 75 MG 24 hr capsule Take 1 capsule (75 mg total) by mouth daily. Take with 150 mg. Total of 187.5 mg. 10/25/14  Yes Dianne Dun, MD                 BP 122/80 mmHg  Pulse 90  Temp(Src) 98.3 F (36.8 C) (Oral)  Resp 16  Ht  (1.676 m)  Wt 180 lb (81.647 kg)  BMI 29.07 kg/m2  SpO2 100% Physical Exam  Constitutional: He is oriented to person, place, and time. No distress.  Alert, nicely groomed Look upset.  HENT:  Head: Atraumatic.  R TM deeply red, mildly dull, possible tiny perforation centrally.  No fluid in canal.   L TM mod dull, no erythema.  Eyes:  Conjugate gaze, no eye redness/drainage  Neck: Neck supple.  Cardiovascular: Normal rate.   Pulmonary/Chest: No respiratory distress.  Abdominal: Soft. He exhibits no distension.  Musculoskeletal: Normal range of motion.  Neurological: He is alert and oriented to person, place, and time.  Face symmetric, speech clear/coherent.  Able to walk into urgent care independently.  Skin: Skin is warm and dry.  No cyanosis  Nursing note and vitals reviewed.   ED Course  Procedures   toradol  IM given at urgent care for pain.  MDM   1. Barotrauma, otic, initial encounter    rx for augmentin because of possible exposure of inner air  to unchlorinated pool water. Rx for small number of percocet for pain (10). Note for work tomorrow. FU ENT.    Eustace Moore, MD 11/06/14 0700

## 2015-01-01 ENCOUNTER — Emergency Department
Admission: EM | Admit: 2015-01-01 | Discharge: 2015-01-01 | Discharge status: Against Medical Advice | Payer: BLUE CROSS/BLUE SHIELD | Attending: Emergency Medicine | Admitting: Emergency Medicine

## 2015-01-01 DIAGNOSIS — Z5320 Procedure and treatment not carried out because of patient's decision for unspecified reasons: Secondary | ICD-10-CM | POA: Insufficient documentation

## 2015-01-01 NOTE — ED Notes (Signed)
Patient here for post accident workman's comp drug screen.  Patient does not need to see provider.  Not seeking treatment.

## 2015-01-06 ENCOUNTER — Emergency Department
Admission: EM | Admit: 2015-01-06 | Discharge: 2015-01-06 | Disposition: A | Discharge status: Home / Self Care | Payer: Worker's Compensation

## 2015-01-06 NOTE — ED Notes (Signed)
WC completed and delivered to lab for courier p/u. 

## 2015-03-08 ENCOUNTER — Ambulatory Visit: Payer: Self-pay | Admitting: Physician Assistant

## 2015-03-08 ENCOUNTER — Encounter: Payer: Self-pay | Admitting: Physician Assistant

## 2015-03-08 VITALS — BP 110/70 | HR 111 | Temp 99.0°F

## 2015-03-08 DIAGNOSIS — J069 Acute upper respiratory infection, unspecified: Secondary | ICD-10-CM

## 2015-03-08 MED ORDER — CEFDINIR 300 MG PO CAPS: 300.0000 mg | ORAL_CAPSULE | Freq: Two times a day (BID) | ORAL | Status: DC

## 2015-03-08 MED ORDER — HYDROCOD POLST-CPM POLST ER 10-8 MG/5ML PO SUER: 5.0000 mL | Freq: Two times a day (BID) | ORAL | Status: DC | PRN

## 2015-03-08 NOTE — Progress Notes (Signed)
S: C/o runny nose and congestion for 5 days, +fever, chills for last 2 days, denies cp/sob, v/, had 3 episodes of diarrhea; cough is sporadic, pt not sleeping well due to cough, has taken multiple otc meds    O: PE: vitals with elevated hr; nad; perrl eomi, normocephalic, tms dull, nasal mucosa red and swollen, throat injected, neck supple no lymph, lungs c t a, cv increased rate at 111, regular rhythm, neuro intact; flu swab neg  A:  Acute u, tachycardia   P: drink fluids, continue regular meds , use otc meds of choice, return if not improving in 5 days, return earlier if worsening , omnicef 300mg  bid x 10d, tussionex 115ml nr

## 2015-03-08 NOTE — Patient Instructions (Signed)
Upper Respiratory Infection, Adult Most upper respiratory infections (URIs) are caused by a virus. A URI affects the nose, throat, and upper air passages. The most common type of URI is often called "the common cold." HOME CARE   Take medicines only as told by your doctor.  Gargle warm saltwater or take cough drops to comfort your throat as told by your doctor.  Use a warm mist humidifier or inhale steam from a shower to increase air moisture. This may make it easier to breathe.  Drink enough fluid to keep your pee (urine) clear or pale yellow.  Eat soups and other clear broths.  Have a healthy diet.  Rest as needed.  Go back to work when your fever is gone or your doctor says it is okay.  You may need to stay home longer to avoid giving your URI to others.  You can also wear a face mask and wash your hands often to prevent spread of the virus.  Use your inhaler more if you have asthma.  Do not use any tobacco products, including cigarettes, chewing tobacco, or electronic cigarettes. If you need help quitting, ask your doctor. GET HELP IF:  You are getting worse, not better.  Your symptoms are not helped by medicine.  You have chills.  You are getting more short of breath.  You have brown or red mucus.  You have yellow or brown discharge from your nose.  You have pain in your face, especially when you bend forward.  You have a fever.  You have puffy (swollen) neck glands.  You have pain while swallowing.  You have white areas in the back of your throat. GET HELP RIGHT AWAY IF:   You have very bad or constant:  Headache.  Ear pain.  Pain in your forehead, behind your eyes, and over your cheekbones (sinus pain).  Chest pain.  You have long-lasting (chronic) lung disease and any of the following:  Wheezing.  Long-lasting cough.  Coughing up blood.  A change in your usual mucus.  You have a stiff neck.  You have changes in  your:  Vision.  Hearing.  Thinking.  Mood. MAKE SURE YOU:   Understand these instructions.  Will watch your condition.  Will get help right away if you are not doing well or get worse.   This information is not intended to replace advice given to you by your health care provider. Make sure you discuss any questions you have with your health care provider.   Document Released: 10/17/2007 Document Revised: 09/14/2014 Document Reviewed: 08/05/2013 Elsevier Interactive Patient Education 2016 Else[employer] Inc[employer] Nonspecific Tachycardia Tachycardia is a faster than normal heartbeat (more than 100 beats per minute). In adults, the heart normally beats between 60 and 100 times a minute. A fast heartbeat may be a normal response to exercise or stress. It does not necessarily mean that something is wrong. However, sometimes when your heart beats too fast it may not be able to pump enough blood to the rest of your body. This can result in chest pain, shortness of breath, dizziness, and even fainting. Nonspecific tachycardia means that the specific cause or pattern of your tachycardia is unknown. CAUSES  Tachycardia may be harmless or it may be due to a more serious underlying cause. Possible causes of tachycardia include:  Exercise or exertion.  Fever.  Pain or injury.  Infection.  Loss of body fluids (dehydration).  Overactive thyroid.  Lack of red blood cells (anemia).  Anxiety and  stress.  Alcohol.  Caffeine.  Tobacco products.  Diet pills.  Illegal drugs.  Heart disease. SYMPTOMS  Rapid or irregular heartbeat (palpitations).  Suddenly feeling your heart beating (cardiac awareness).  Dizziness.  Tiredness (fatigue).  Shortness of breath.  Chest pain.  Nausea.  Fainting. DIAGNOSIS  Your caregiver will perform a physical exam and take your medical history. In some cases, a heart specialist (cardiologist) may be consulted. Your caregiver may also order:  Blood  tests.  Electrocardiography. This test records the electrical activity of your heart.  A heart monitoring test. TREATMENT  Treatment will depend on the likely cause of your tachycardia. The goal is to treat the underlying cause of your tachycardia. Treatment methods may include:  Replacement of fluids or blood through an intravenous (IV) tube for moderate to severe dehydration or anemia.  New medicines or changes in your current medicines.  Diet and lifestyle changes.  Treatment for certain infections.  Stress relief or relaxation methods. HOME CARE INSTRUCTIONS   Rest.  Drink enough fluids to keep your urine clear or pale yellow.  Do not smoke.  Avoid:  Caffeine.  Tobacco.  Alcohol.  Chocolate.  Stimulants such as over-the-counter diet pills or pills that help you stay awake.  Situations that cause anxiety or stress.  Illegal drugs such as marijuana, phencyclidine (PCP), and cocaine.  Only take medicine as directed by your caregiver.  Keep all follow-up appointments as directed by your caregiver. SEEK IMMEDIATE MEDICAL CARE IF:   You have pain in your chest, upper arms, jaw, or neck.  You become weak, dizzy, or feel faint.  You have palpitations that will not go away.  You vomit, have diarrhea, or pass blood in your stool.  Your skin is cool, pale, and wet.  You have a fever that will not go away with rest, fluids, and medicine. MAKE SURE YOU:   Understand these instructions.  Will watch your condition.  Will get help right away if you are not doing well or get worse.   This information is not intended to replace advice given to you by your health care provider. Make sure you discuss any questions you have with your health care provider.   Document Released: 06/07/2004 Document Revised: 07/23/2011 Document Reviewed: 11/12/2014 Elsevier Interactive Patient Education [employer]2016 Elsevier Inc.

## 2015-03-10 ENCOUNTER — Encounter: Payer: Self-pay | Admitting: Physician Assistant

## 2015-03-10 ENCOUNTER — Ambulatory Visit: Payer: Self-pay | Admitting: Physician Assistant

## 2015-03-10 VITALS — BP 120/80 | HR 107 | Temp 97.9°F

## 2015-03-10 DIAGNOSIS — R000 Tachycardia, unspecified: Secondary | ICD-10-CM

## 2015-03-10 LAB — POCT URINALYSIS DIPSTICK
Bilirubin, UA: NEGATIVE
GLUCOSE UA: NEGATIVE
Ketones, UA: NEGATIVE
LEUKOCYTES UA: NEGATIVE
NITRITE UA: NEGATIVE
Protein, UA: NEGATIVE
RBC UA: NEGATIVE
Spec Grav, UA: 1.02
UROBILINOGEN UA: 0.2
pH, UA: 6

## 2015-03-10 NOTE — Progress Notes (Signed)
S: here for recheck of HR, no fever, still tired and fatigued, feels like he's in a fog, last urinated around 1230 this morning, no cp/sob  O: pulse 107, was 111 on previous visit, lungs c t a, cv rrr, ua wnl; pulse rechecked after drinking several cups of water, was 84  A: increased hr due to dehydration  P: cbc met c; drink plenty of fluids, may return to work

## 2015-03-11 ENCOUNTER — Emergency Department: Payer: BLUE CROSS/BLUE SHIELD

## 2015-03-11 ENCOUNTER — Emergency Department
Admission: EM | Admit: 2015-03-11 | Discharge: 2015-03-11 | Disposition: A | Discharge status: Home / Self Care | Payer: BLUE CROSS/BLUE SHIELD | Attending: Emergency Medicine | Admitting: Emergency Medicine

## 2015-03-11 DIAGNOSIS — Z792 Long term (current) use of antibiotics: Secondary | ICD-10-CM | POA: Diagnosis not present

## 2015-03-11 DIAGNOSIS — E119 Type 2 diabetes mellitus without complications: Secondary | ICD-10-CM | POA: Diagnosis not present

## 2015-03-11 DIAGNOSIS — R42 Dizziness and giddiness: Secondary | ICD-10-CM | POA: Insufficient documentation

## 2015-03-11 DIAGNOSIS — Z79899 Other long term (current) drug therapy: Secondary | ICD-10-CM | POA: Insufficient documentation

## 2015-03-11 DIAGNOSIS — R079 Chest pain, unspecified: Secondary | ICD-10-CM | POA: Diagnosis present

## 2015-03-11 DIAGNOSIS — I951 Orthostatic hypotension: Secondary | ICD-10-CM | POA: Insufficient documentation

## 2015-03-11 DIAGNOSIS — R5383 Other fatigue: Secondary | ICD-10-CM | POA: Insufficient documentation

## 2015-03-11 DIAGNOSIS — R000 Tachycardia, unspecified: Secondary | ICD-10-CM | POA: Diagnosis not present

## 2015-03-11 HISTORY — DX: Type 2 diabetes mellitus without complications: E11.9

## 2015-03-11 LAB — COMPREHENSIVE METABOLIC PANEL
ALBUMIN: 4.5 g/dL (ref 3.5–5.5)
ALK PHOS: 55 U/L (ref 38–126)
ALT: 25 IU/L (ref 0–44)
ALT: 28 U/L (ref 17–63)
AST: 23 IU/L (ref 0–40)
AST: 29 U/L (ref 15–41)
Albumin/Globulin Ratio: 1.6 (ref 1.1–2.5)
Albumin: 4.3 g/dL (ref 3.5–5.0)
Alkaline Phosphatase: 65 IU/L (ref 39–117)
Anion gap: 7 (ref 5–15)
BILIRUBIN TOTAL: 1.2 mg/dL (ref 0.0–1.2)
BILIRUBIN TOTAL: 1.2 mg/dL (ref 0.3–1.2)
BUN / CREAT RATIO: 7 — AB (ref 9–20)
BUN: 8 mg/dL (ref 6–24)
BUN: 9 mg/dL (ref 6–20)
CALCIUM: 9.1 mg/dL (ref 8.9–10.3)
CHLORIDE: 95 mmol/L — AB (ref 97–106)
CO2: 27 mmol/L (ref 18–29)
CO2: 28 mmol/L (ref 22–32)
CREATININE: 1.14 mg/dL (ref 0.76–1.27)
CREATININE: 1.12 mg/dL (ref 0.61–1.24)
Calcium: 9.8 mg/dL (ref 8.7–10.2)
Chloride: 100 mmol/L — ABNORMAL LOW (ref 101–111)
GFR calc non Af Amer: 80 mL/min/{1.73_m2} (ref >59)
GFR, EST AFRICAN AMERICAN: 92 mL/min/{1.73_m2} (ref >59)
GLOBULIN, TOTAL: 2.9 g/dL (ref 1.5–4.5)
GLUCOSE: 114 mg/dL — AB (ref 65–99)
Glucose, Bld: 93 mg/dL (ref 65–99)
Potassium: 4.4 mmol/L (ref 3.5–5.2)
Potassium: 3.9 mmol/L (ref 3.5–5.1)
SODIUM: 137 mmol/L (ref 136–144)
Sodium: 135 mmol/L (ref 135–145)
TOTAL PROTEIN: 7.4 g/dL (ref 6.0–8.5)
Total Protein: 7.5 g/dL (ref 6.5–8.1)

## 2015-03-11 LAB — CBC WITH DIFFERENTIAL/PLATELET
BASOS ABS: 0 10*3/uL (ref 0–0.1)
BASOS PCT: 0 %
Basophils Absolute: 0 10*3/uL (ref 0.0–0.2)
Basos: 1 %
EOS (ABSOLUTE): 0.2 10*3/uL (ref 0.0–0.4)
EOS PCT: 2 %
EOS: 3 %
Eosinophils Absolute: 0.2 10*3/uL (ref 0–0.7)
HEMATOCRIT: 47 % (ref 37.5–51.0)
HEMATOCRIT: 43.7 % (ref 40.0–52.0)
HEMOGLOBIN: 16.5 g/dL (ref 12.6–17.7)
Hemoglobin: 15.1 g/dL (ref 13.0–18.0)
Immature Grans (Abs): 0.1 10*3/uL (ref 0.0–0.1)
Immature Granulocytes: 1 %
LYMPHS ABS: 2.2 10*3/uL (ref 0.7–3.1)
Lymphocytes Relative: 25 %
Lymphs Abs: 1.8 10*3/uL (ref 1.0–3.6)
Lymphs: 35 %
MCH: 31.1 pg (ref 26.6–33.0)
MCH: 30.4 pg (ref 26.0–34.0)
MCHC: 35.1 g/dL (ref 31.5–35.7)
MCHC: 34.6 g/dL (ref 32.0–36.0)
MCV: 89 fL (ref 79–97)
MCV: 87.9 fL (ref 80.0–100.0)
MONO ABS: 0.7 10*3/uL (ref 0.2–1.0)
MONOCYTES: 10 %
Monocytes Absolute: 0.6 10*3/uL (ref 0.1–0.9)
Monocytes Relative: 10 %
NEUTROS ABS: 3.1 10*3/uL (ref 1.4–7.0)
NEUTROS ABS: 4.6 10*3/uL (ref 1.4–6.5)
Neutrophils Relative %: 63 %
Neutrophils: 50 %
PLATELETS: 258 10*3/uL (ref 150–440)
Platelets: 253 10*3/uL (ref 150–379)
RBC: 5.3 x10E6/uL (ref 4.14–5.80)
RBC: 4.97 MIL/uL (ref 4.40–5.90)
RDW: 13.5 % (ref 12.3–15.4)
RDW: 12.9 % (ref 11.5–14.5)
WBC: 6.2 10*3/uL (ref 3.4–10.8)
WBC: 7.4 10*3/uL (ref 3.8–10.6)

## 2015-03-11 LAB — TROPONIN I

## 2015-03-11 LAB — GLUCOSE, CAPILLARY: Glucose-Capillary: 95 mg/dL (ref 65–99)

## 2015-03-11 LAB — MONONUCLEOSIS SCREEN: Mono Screen: NEGATIVE

## 2015-03-11 MED ORDER — SODIUM CHLORIDE 0.9 % IV BOLUS (SEPSIS)
1000.0000 mL | Freq: Once | INTRAVENOUS | Status: AC
  Administered 2015-03-11: 1000 mL via INTRAVENOUS

## 2015-03-11 MED ORDER — ONDANSETRON HCL 4 MG/2ML IJ SOLN
4.0000 mg | Freq: Once | INTRAMUSCULAR | Status: AC
  Administered 2015-03-11: 4 mg via INTRAVENOUS
  Filled 2015-03-11: qty 2

## 2015-03-11 MED ORDER — IOHEXOL 350 MG/ML SOLN
80.0000 mL | Freq: Once | INTRAVENOUS | Status: AC | PRN
  Administered 2015-03-11: 80 mL via INTRAVENOUS

## 2015-03-11 MED ORDER — MORPHINE SULFATE (PF) 4 MG/ML IV SOLN
4.0000 mg | Freq: Once | INTRAVENOUS | Status: AC
  Administered 2015-03-11: 4 mg via INTRAVENOUS
  Filled 2015-03-11: qty 1

## 2015-03-11 NOTE — Progress Notes (Signed)
Called patient and left a message on his voicemail regarding his lab results.

## 2015-03-11 NOTE — Discharge Instructions (Signed)
You were evaluated for fatigue and found have elevated heart rate upon standing. I suspect this is due to some dehydration. You were given IV fluids in the emergency department. The rest of your exam and evaluation are reassuring. Drink plenty of fluids at home. Rest until you see your primary care physician again follow-up.  Return to the emergency room for any worsening condition including fever, confusion or altered mental status, chest pain or shortness of breath, skin rash, or any other symptoms concerning to you.   Nonspecific Tachycardia Tachycardia is a faster than normal heartbeat (more than 100 beats per minute). In adults, the heart normally beats between 60 and 100 times a minute. A fast heartbeat may be a normal response to exercise or stress. It does not necessarily mean that something is wrong. However, sometimes when your heart beats too fast it may not be able to pump enough blood to the rest of your body. This can result in chest pain, shortness of breath, dizziness, and even fainting. Nonspecific tachycardia means that the specific cause or pattern of your tachycardia is unknown. CAUSES  Tachycardia may be harmless or it may be due to a more serious underlying cause. Possible causes of tachycardia include:  Exercise or exertion.  Fever.  Pain or injury.  Infection.  Loss of body fluids (dehydration).  Overactive thyroid.  Lack of red blood cells (anemia).  Anxiety and stress.  Alcohol.  Caffeine.  Tobacco products.  Diet pills.  Illegal drugs.  Heart disease. SYMPTOMS  Rapid or irregular heartbeat (palpitations).  Suddenly feeling your heart beating (cardiac awareness).  Dizziness.  Tiredness (fatigue).  Shortness of breath.  Chest pain.  Nausea.  Fainting. DIAGNOSIS  Your caregiver will perform a physical exam and take your medical history. In some cases, a heart specialist (cardiologist) may be consulted. Your caregiver may also  order:  Blood tests.  Electrocardiography. This test records the electrical activity of your heart.  A heart monitoring test. TREATMENT  Treatment will depend on the likely cause of your tachycardia. The goal is to treat the underlying cause of your tachycardia. Treatment methods may include:  Replacement of fluids or blood through an intravenous (IV) tube for moderate to severe dehydration or anemia.  New medicines or changes in your current medicines.  Diet and lifestyle changes.  Treatment for certain infections.  Stress relief or relaxation methods. HOME CARE INSTRUCTIONS   Rest.  Drink enough fluids to keep your urine clear or pale yellow.  Do not smoke.  Avoid:  Caffeine.  Tobacco.  Alcohol.  Chocolate.  Stimulants such as over-the-counter diet pills or pills that help you stay awake.  Situations that cause anxiety or stress.  Illegal drugs such as marijuana, phencyclidine (PCP), and cocaine.  Only take medicine as directed by your caregiver.  Keep all follow-up appointments as directed by your caregiver. SEEK IMMEDIATE MEDICAL CARE IF:   You have pain in your chest, upper arms, jaw, or neck.  You become weak, dizzy, or feel faint.  You have palpitations that will not go away.  You vomit, have diarrhea, or pass blood in your stool.  Your skin is cool, pale, and wet.  You have a fever that will not go away with rest, fluids, and medicine. MAKE SURE YOU:   Understand these instructions.  Will watch your condition.  Will get help right away if you are not doing well or get worse.   This information is not intended to replace advice given to you  by your health care provider. Make sure you discuss any questions you have with your health care provider.   Document Released: 06/07/2004 Document Revised: 07/23/2011 Document Reviewed: 11/12/2014 Elsevier Interactive Patient Education 2016 [employer]  Dizziness Dizziness is a common problem. It  makes you feel unsteady or lightheaded. You may feel like you are about to pass out (faint). Dizziness can lead to injury if you stumble or fall. Anyone can get dizzy, but dizziness is more common in older adults. This condition can be caused by a number of things, including:  Medicines.  Dehydration.  Illness. HOME CARE Following these instructions may help with your condition: Eating and Drinking  Drink enough fluid to keep your pee (urine) clear or pale yellow. This helps to keep you from getting dehydrated. Try to drink more clear fluids, such as water.  Do not drink alcohol.  Limit how much caffeine you drink or eat if told by your doctor.  Limit how much salt you drink or eat if told by your doctor. Activity  Avoid making quick movements.  When you stand up from sitting in a chair, steady yourself until you feel okay.  In the morning, first sit up on the side of the bed. When you feel okay, stand slowly while you hold onto something. Do this until you know that your balance is fine.  Move your legs often if you need to stand in one place for a long time. Tighten and relax your muscles in your legs while you are standing.  Do not drive or use heavy machinery if you feel dizzy.  Avoid bending down if you feel dizzy. Place items in your home so that they are easy for you to reach without leaning over. Lifestyle  Do not use any tobacco products, including cigarettes, chewing tobacco, or electronic cigarettes. If you need help quitting, ask your doctor.  Try to lower your stress level, such as with yoga or meditation. Talk with your doctor if you need help. General Instructions  Watch your dizziness for any changes.  Take medicines only as told by your doctor. Talk with your doctor if you think that your dizziness is caused by a medicine that you are taking.  Tell a friend or a family member that you are feeling dizzy. If he or she notices any changes in your behavior, have  this person call your doctor.  Keep all follow-up visits as told by your doctor. This is important. GET HELP IF:  Your dizziness does not go away.  Your dizziness or light-headedness gets worse.  You feel sick to your stomach (nauseous).  You have trouble hearing.  You have new symptoms.  You are unsteady on your feet or you feel like the room is spinning. GET HELP RIGHT AWAY IF:  You throw up (vomit) or have diarrhea and are unable to eat or drink anything.  You have trouble:  Talking.  Walking.  Swallowing.  Using your arms, hands, or legs.  You feel generally weak.  You are not thinking clearly or you have trouble forming sentences. It may take a friend or family member to notice this.  You have:  Chest pain.  Pain in your belly (abdomen).  Shortness of breath.  Sweating.  Your vision changes.  You are bleeding.  You have a headache.  You have neck pain or a stiff neck.  You have a fever.   This information is not intended to replace advice given to you by your health  care provider. Make sure you discuss any questions you have with your health care provider.   Document Released: 04/19/2011 Document Revised: 09/14/2014 Document Reviewed: 04/26/2014 Elsevier Interactive Patient Education 2016 [employer].  Fatigue Fatigue is feeling tired all of the time, a lack of energy, or a lack of motivation. Occasional or mild fatigue is often a normal response to activity or life in general. However, long-lasting (chronic) or extreme fatigue may indicate an underlying medical condition. HOME CARE INSTRUCTIONS  Watch your fatigue for any changes. The following actions may help to lessen any discomfort you are feeling:  Talk to your health care provider about how much sleep you need each night. Try to get the required amount every night.  Take medicines only as directed by your health care provider.  Eat a healthy and nutritious diet. Ask your health care  provider if you need help changing your diet.  Drink enough fluid to keep your urine clear or pale yellow.  Practice ways of relaxing, such as yoga, meditation, massage therapy, or acupuncture.  Exercise regularly.   Change situations that cause you stress. Try to keep your work and personal routine reasonable.  Do not abuse illegal drugs.  Limit alcohol intake to no more than 1 drink per day for nonpregnant women and 2 drinks per day for men. One drink equals 12 ounces of beer, 5 ounces of wine, or 1 ounces of hard liquor.  Take a multivitamin, if directed by your health care provider. SEEK MEDICAL CARE IF:   Your fatigue does not get better.  You have a fever.   You have unintentional weight loss or gain.  You have headaches.   You have difficulty:   Falling asleep.  Sleeping throughout the night.  You feel angry, guilty, anxious, or sad.   You are unable to have a bowel movement (constipation).   You skin is dry.   Your legs or another part of your body is swollen.  SEEK IMMEDIATE MEDICAL CARE IF:   You feel confused.   Your vision is blurry.  You feel faint or pass out.   You have a severe headache.   You have severe abdominal, pelvic, or back pain.   You have chest pain, shortness of breath, or an irregular or fast heartbeat.   You are unable to urinate or you urinate less than normal.   You develop abnormal bleeding, such as bleeding from the rectum, vagina, nose, lungs, or nipples.  You vomit blood.   You have thoughts about harming yourself or committing suicide.   You are worried that you might harm someone else.    This information is not intended to replace advice given to you by your health care provider. Make sure you discuss any questions you have with your health care provider.   Document Released: 02/25/2007 Document Revised: 05/21/2014 Document Reviewed: 09/01/2013 Elsevier Interactive Patient Education [employer].

## 2015-03-11 NOTE — ED Provider Notes (Signed)
[facility] Emergency Department Provider Note   ____________________________________________  Time seen: 10:15 AM I have reviewed the triage vital signs and the triage nursing note.  HISTORY  Chief Complaint Chest Pain   Historian Patient and fianc  HPI Eric [name] is a 39 y.o. male is here for evaluation of left-sided chest tightness, as well as fatigue since Monday. Patient states on Tuesday he saw his primary care provider and told they weren't sure what was going on and gave him an antibiotic. He was seen again on Thursday and because of elevated heart rate was given by mouth fluids in the office, and heart rate did decrease and so he was discharged home.  He denies fevers. He denies cough. He denies shortness of breath. He has had some intermittent left-sided chest pressure. He has a history of right lower extremity swelling at times, and the last 2 days it was worse than usual, although today is better. No family history of PE or DVT that he knows of. No sore throat, nasal congestion, or coughing. He's had no nausea, vomiting, diarrhea, nor constipation. He has had a mild left-sided headache intermittently over the past 2 days. No confusion or altered mental status.  No worsening depression or stress/anxiety.    Past Medical History  Diagnosis Date  . Overweight(278.02)   . Depression   . Acid reflux   . Diabetes mellitus without complication [facility](HCC)     Patient Active Problem List   Diagnosis Date Noted  . Major depressive disorder, recurrent episode, severe (HCC) 03/17/2012  . Atypical chest pain 10/03/2011  . Anxiety 11/02/2010  . OBESITY, MORBID 05/30/2010  . DM 12/27/2009  . GERD 12/21/2008  . GASTRIC ULCER 12/21/2008  . VARICOSE VEINS, LOWER EXTREMITIES 12/20/2008  . SEBACEOUS CYST, NECK 12/20/2008  . INSOMNIA 05/19/2008  . CHEST PAIN 04/08/2007  . CHEST WALL PAIN, ACUTE 04/08/2007    Past Surgical History  Procedure Laterality  Date  . Appendectomy  1988  . Esophagogastroduodenoscopy  05/26/2004    Dr. Arlyce DiceKaplan  . Laparoscopic gastric banding  08/2010    Current Outpatient Rx  Name  Route  Sig  Dispense  Refill  . cefdinir (OMNICEF) 300 MG capsule   Oral   Take 1 capsule (300 mg total) by mouth 2 (two) times daily.   20 capsule   0   . chlorpheniramine-HYDROcodone (TUSSIONEX PENNKINETIC ER) 10-8 MG/5ML SUER   Oral   Take 5 mLs by mouth every 12 (twelve) hours as needed for cough.   115 mL   0     rx written   . pantoprazole (PROTONIX) 40 MG tablet   Oral   Take 1 tablet (40 mg total) by mouth daily. Patient taking differently: Take 40 mg by mouth at bedtime.    30 tablet   3   . traZODone (DESYREL) 100 MG tablet   Oral   Take 1.5 tablets (150 mg total) by mouth at bedtime as needed for sleep.   30 tablet   0   . Venlafaxine HCl 225 MG TB24   Oral   Take 1 tablet by mouth daily.         Marland Kitchen. zolpidem (AMBIEN) 10 MG tablet   Oral   Take 10 mg by mouth at bedtime as needed for sleep.         Marland Kitchen. oxyCODONE-acetaminophen (PERCOCET/ROXICET) 5-325 MG per tablet   Oral   Take 1 tablet by mouth every 4 (four) hours as needed  for severe pain. Patient not taking: Reported on 03/11/2015   10 tablet   0   . venlafaxine XR (EFFEXOR-XR) 150 MG 24 hr capsule   Oral   Take 1 capsule (150 mg total) by mouth daily. Patient not taking: Reported on 03/11/2015   30 capsule   0     Take with 37.5 mg. Total of 187.5 mg.   . venlafaxine XR (EFFEXOR-XR) 75 MG 24 hr capsule   Oral   Take 1 capsule (75 mg total) by mouth daily. Take with 150 mg. Total of 187.5 mg. Patient not taking: Reported on 03/11/2015   30 capsule   0     Allergies Review of patient's allergies indicates no known allergies.  Family History  Problem Relation Age of Onset  . Heart disease Father   . Heart attack Father 61  . Diabetes Mellitus II Father   . Diabetes Mellitus II Mother   . Hepatitis B Mother     Social  History Social History  Substance Use Topics  . Smoking status: Never Smoker   . Smokeless tobacco: Current User    Types: Chew     Comment: Pouches-3-4 pouches/day for 6-8 years-in the process of quitting  . Alcohol Use: Yes     Comment: Twice a year.    Review of Systems  Constitutional: Negative for fever. Eyes: Negative for visual changes. ENT: Negative for sore throat. Cardiovascular: Negative for palpitations. Positive for dizziness today. No syncope. Respiratory: Negative for shortness of breath. Gastrointestinal: Negative for abdominal pain, vomiting and diarrhea. Genitourinary: Negative for dysuria. Musculoskeletal: Negative for back pain. Negative for muscular pains. Skin: Negative for rash. Neurological: Negative for focal weakness or numbness.. 10 point Review of Systems otherwise negative ____________________________________________   PHYSICAL EXAM:  VITAL SIGNS: ED Triage Vitals  Enc Vitals Group     BP 03/11/15 0909 125/80 mmHg     Pulse Rate 03/11/15 0909 104     Resp 03/11/15 0909 18     Temp 03/11/15 0909 98.4 F (36.9 C)     Temp Source 03/11/15 0909 Oral     SpO2 03/11/15 0909 97 %     Weight 03/11/15 0909 175 lb (79.379 kg)     Height 03/11/15 0909  (1.676 m)     Head Cir --      Peak Flow --      Pain Score 03/11/15 0911 5     Pain Loc --      Pain Edu? --      Excl. in GC? --      Constitutional: Alert and oriented. Well appearing and in no distress. Eyes: Conjunctivae are normal. PERRL. Normal extraocular movements. ENT   Head: Normocephalic and atraumatic.   Nose: No congestion/rhinnorhea.   Mouth/Throat: Mucous membranes are moist.   Neck: No stridor. No neck stiffness. Cardiovascular/Chest: Normal rate, regular rhythm.  No murmurs, rubs, or gallops. Respiratory: Normal respiratory effort without tachypnea nor retractions. Breath sounds are clear and equal bilaterally. No wheezes/rales/rhonchi. Gastrointestinal:  Soft. No distention, no guarding, no rebound. Nontender   Genitourinary/rectal:Deferred Musculoskeletal: Nontender with normal range of motion in all extremities. No joint effusions.  No lower extremity tenderness.  No edema. Neurologic:  Normal speech and language. No gross or focal neurologic deficits are appreciated. Skin:  Skin is warm, dry and intact. No rash noted. Psychiatric: Mood and affect are normal. Speech and behavior are normal. Patient exhibits appropriate insight and judgment.  ____________________________________________   EKG I,  Governor Rooks, MD, the attending physician have personally viewed and interpreted all ECGs.  107 bpm. Sinus tachycardia with PAC. Narrow QRS. Normal axis. Nonspecific T-wave   Rhythm strip interpreted by me Heart rate 142 bpm. Sinus tachycardia. ____________________________________________  LABS (pertinent positives/negatives)  Comments a metabolic panel without significant abnormality Troponin less than 0.03 CBC within normal limits Mono negative Glucose 95  ____________________________________________  RADIOLOGY All Xrays were viewed by me. Imaging interpreted by Radiologist.  Chest x-ray portable: No active disease  CT chest with contrast for PE:   IMPRESSION: No acute cardiopulmonary disease and no evidence of pulmonary embolism.  Lap band apparatus in normal position. __________________________________________  PROCEDURES  Procedure(s) performed: None  Critical Care performed: None  ____________________________________________   ED COURSE / ASSESSMENT AND PLAN  CONSULTATIONS: None  Pertinent labs & imaging results that were available during my care of the patient were reviewed by me and considered in my medical decision making (see chart for details).  Patient's main complaint is generalized fatigue over the past 5 days, with tachycardia here which ranges from within normal limits at 90 up to 142.  This does  appear to be somewhat orthostatic, however I'm keeping a suspicion for other sources including possible PE.  Patient was given IV fluids for tachycardia which did seem to be orthostatic.  I did proceed with a chest CT to rule out PE due to patient's symptoms, and thankfully this was negative for PE.  His exam and evaluation are reassuring here in the emergency department. He was treated with IV fluids for elevated heart rate with standing. I suspect he may actually have some sort of viral illness that is making him fatigued.  And close follow-up with primary care physician on Monday.  Patient / Family / Caregiver informed of clinical course, medical decision-making process, and agree with plan.   I discussed return precautions, follow-up instructions, and discharged instructions with patient and/or family.  ___________________________________________   FINAL CLINICAL IMPRESSION(S) / ED DIAGNOSES   Final diagnoses:  Orthostatic dizziness  Tachycardia  Other fatigue       Governor Rooks, MD 03/11/15 1434

## 2015-03-11 NOTE — ED Notes (Signed)
Pt c/o left sided chest tightness "feels like a balloon" that started today with dizziness..Marland Kitchen

## 2015-03-11 NOTE — ED Notes (Signed)
Pt denies shortness of breath. States chest pain is unchanged from previous. Denies radiation to left arm. States became slightly dizzy upon standing. Denies nausea.

## 2015-03-14 ENCOUNTER — Ambulatory Visit (INDEPENDENT_AMBULATORY_CARE_PROVIDER_SITE_OTHER): Payer: BLUE CROSS/BLUE SHIELD | Admitting: Family Medicine

## 2015-03-14 ENCOUNTER — Encounter: Payer: Self-pay | Admitting: Family Medicine

## 2015-03-14 VITALS — BP 108/70 | HR 90 | Temp 98.1°F | Wt 189.2 lb

## 2015-03-14 DIAGNOSIS — R071 Chest pain on breathing: Secondary | ICD-10-CM

## 2015-03-14 DIAGNOSIS — R000 Tachycardia, unspecified: Secondary | ICD-10-CM | POA: Insufficient documentation

## 2015-03-14 DIAGNOSIS — Z23 Encounter for immunization: Secondary | ICD-10-CM

## 2015-03-14 NOTE — Assessment & Plan Note (Signed)
Improving with fluids.  Advised to drink some fluids and rest.  Note written for work.

## 2015-03-14 NOTE — Assessment & Plan Note (Signed)
Resolved. ? Viral illness.  Last dose of abx today. Continue to push fluids.

## 2015-03-14 NOTE — Progress Notes (Signed)
Subjective:   Patient ID: Eric [name], male    DOB: [birth year], 39 y.o.   MRN: [id number]  Eric [name] is a pleasant 39 y.o. year old male who presents to clinic today with Hospitalization Follow-up  on 03/14/2015  HPI:  Was seen at [facility] on 10/28 for chest pain.  Notes and results reviewed.  Went to [facility] walk in clinic for dehydration and chest pain two previous days and per report, was given fluids in the office, abx (omnicef) and cough suppressant.  Lab Results  Component Value Date   WBC 7.4 03/11/2015   HGB 15.1 03/11/2015   HCT 43.7 03/11/2015   MCV 87.9 03/11/2015   PLT 258 03/11/2015    Ct Angio Chest Pe W/cm &/or Wo Cm  03/11/2015  CLINICAL DATA:  Shortness of breath and chest pain radiating to left arm. Dizzy upon standing. EXAM: CT ANGIOGRAPHY CHEST WITH CONTRAST TECHNIQUE: Multidetector CT imaging of the chest was performed using the standard protocol during bolus administration of intravenous contrast. Multiplanar CT image reconstructions and MIPs were obtained to evaluate the vascular anatomy. CONTRAST:  80mL OMNIPAQUE IOHEXOL 350 MG/ML SOLN COMPARISON:  08/05/2012 FINDINGS: Lungs are adequately inflated without consolidation or effusion. Subtle bibasilar dependent atelectasis airways are normal. Heart is normal size. Pulmonary arterial system is normal without evidence of emboli. There is no mediastinal, hilar or axillary adenopathy. There is a 6 mm focal density along the dependent portion of the distal esophagus likely ingested material. Images through the upper abdomen demonstrate lap band apparatus in adequate position. Remainder of the exam is unchanged. Review of the MIP images confirms the above findings. IMPRESSION: No acute cardiopulmonary disease and no evidence of pulmonary embolism. Lap band apparatus in normal position. Electronically Signed   By: Elberta Fortis M.D.   On: 03/11/2015 13:36   Dg Chest Port 1 View  03/11/2015  CLINICAL DATA:  Left-sided  chest pain for 1 week EXAM: PORTABLE CHEST 1 VIEW COMPARISON:  11/25/2013 FINDINGS: Normal heart size. Lungs under aerated and grossly clear. Gastric band is stable in position at the gastroesophageal junction. No pneumothorax. No pleural effusion. IMPRESSION: No active disease. Electronically Signed   By: Jolaine Click M.D.   On: 03/11/2015 11:15   Given IVF for tachycardia/orthostasis.   Advised to follow up with me here today.  Feeling better.  Still tired.  Current Outpatient Prescriptions on File Prior to Visit  Medication Sig Dispense Refill  . cefdinir (OMNICEF) 300 MG capsule Take 1 capsule (300 mg total) by mouth 2 (two) times daily. 20 capsule 0  . chlorpheniramine-HYDROcodone (TUSSIONEX PENNKINETIC ER) 10-8 MG/5ML SUER Take 5 mLs by mouth every 12 (twelve) hours as needed for cough. 115 mL 0  . pantoprazole (PROTONIX) 40 MG tablet Take 1 tablet (40 mg total) by mouth daily. (Patient taking differently: Take 40 mg by mouth at bedtime. ) 30 tablet 3  . traZODone (DESYREL) 100 MG tablet Take 1.5 tablets (150 mg total) by mouth at bedtime as needed for sleep. 30 tablet 0  . Venlafaxine HCl 225 MG TB24 Take 1 tablet by mouth daily.    Marland Kitchen zolpidem (AMBIEN) 10 MG tablet Take 10 mg by mouth at bedtime as needed for sleep.     No current facility-administered medications on file prior to visit.    No Known Allergies  Past Medical History  Diagnosis Date  . Overweight(278.02)   . Depression   . Acid reflux   . [facility]     Past Surgical History  Procedure Laterality Date  . Appendectomy  1988  . Esophagogastroduodenoscopy  05/26/2004    Dr. Arlyce DiceKaplan  . Laparoscopic gastric banding  08/2010    Family History  Problem Relation Age of Onset  . Heart disease Father   . Heart attack Father 2266  . Diabetes Mellitus II Father   . Diabetes Mellitus II Mother   . Hepatitis B Mother     Social History   Social History  . Marital Status: Married     Spouse Name: N/A  . Number of Children: 2  . Years of Education: N/A   Occupational History  . POLICE OFFICER    Social History Main Topics  . Smoking status: Never Smoker   . Smokeless tobacco: Current User    Types: Chew     Comment: Pouches-3-4 pouches/day for 6-8 years-in the process of quitting  . Alcohol Use: Yes     Comment: Twice a year.  . Drug Use: No     Comment: Caffiene: None  . Sexual Activity:    Partners: Female     Comment: Patient reports some decreased libido   Other Topics Concern  . Not on file   Social History Narrative   The PMH, PSH, Social History, Family History, Medications, and allergies [facility], and have been updated if relevant.    Review of Systems  Constitutional: Positive for fatigue. Negative for fever and unexpected weight change.  Respiratory: Positive for choking.   Cardiovascular: Negative.   Gastrointestinal: Negative.   Endocrine: Negative.   Genitourinary: Negative.   Musculoskeletal: Negative.   Allergic/Immunologic: Negative.   Neurological: Negative.   All other systems reviewed and are negative.      Objective:    BP 108/70 mmHg  Pulse 90  Temp(Src) 98.1 F (36.7 C) (Oral)  Wt 189 lb 4 oz (85.843 kg)  SpO2 98%   Physical Exam  Constitutional: He is oriented to person, place, and time. He appears well-developed and well-nourished. No distress.  HENT:  Head: Normocephalic.  Eyes: Conjunctivae are normal.  Cardiovascular: Normal rate and regular rhythm.   Pulmonary/Chest: Effort normal and breath sounds normal.  Musculoskeletal: Normal range of motion.  Neurological: He is alert and oriented to person, place, and time. No cranial nerve deficit.  Skin: Skin is warm and dry.  Psychiatric: He has a normal mood and affect. His behavior is normal. Judgment and thought content normal.  Nursing note and vitals reviewed.         Assessment & Plan:   CHEST WALL PAIN, ACUTE  Need for influenza  vaccination - Plan: Flu Vaccine QUAD 36+ mos PF IM (Fluarix & Fluzone Quad PF)  Tachycardia No Follow-up on file.

## 2015-03-14 NOTE — Progress Notes (Signed)
Pre visit review using our clinic review tool, if applicable. No additional management support is needed unless otherwise documented below in the visit note. 

## 2015-03-21 ENCOUNTER — Encounter: Payer: Self-pay | Admitting: Family Medicine

## 2015-03-21 ENCOUNTER — Ambulatory Visit (INDEPENDENT_AMBULATORY_CARE_PROVIDER_SITE_OTHER): Payer: BLUE CROSS/BLUE SHIELD | Admitting: Family Medicine

## 2015-03-21 VITALS — BP 128/66 | HR 89 | Temp 98.1°F | Wt 183.5 lb

## 2015-03-21 DIAGNOSIS — R071 Chest pain on breathing: Secondary | ICD-10-CM | POA: Diagnosis not present

## 2015-03-21 DIAGNOSIS — R000 Tachycardia, unspecified: Secondary | ICD-10-CM

## 2015-03-21 MED ORDER — PANTOPRAZOLE SODIUM 40 MG PO TBEC: 40.0000 mg | DELAYED_RELEASE_TABLET | Freq: Every day | ORAL | Status: DC

## 2015-03-21 NOTE — Assessment & Plan Note (Signed)
Resolved along with other symptoms listed. No further rx or work up necessary. Form filled out and returned to pt clearing him for work.

## 2015-03-21 NOTE — Progress Notes (Signed)
Subjective:   Patient ID: Eric [name], male    DOB: 05/10/1976, 39 y.o.   MRN: [id number]  Eric [name] is a pleasant 39 y.o. year old male who presents to clinic today with Follow-up  on 03/21/2015  HPI:  Saw him on 10/31 for ER  follow up- note reviewed in Epic.  Chest wall pain, dizziness and tachycardia have all resolved.  [place] [employer] and was told he needed to be cleared and form filled out (he has this with him today) to return to work [employer].  Denies any recurrent fatigue, CP, dizziness or SOB.  Current Outpatient Prescriptions on File Prior to Visit  Medication Sig Dispense Refill  . traZODone (DESYREL) 100 MG tablet Take 1.5 tablets (150 mg total) by mouth at bedtime as needed for sleep. 30 tablet 0  . Venlafaxine HCl 225 MG TB24 Take 1 tablet by mouth daily.    Marland Kitchen. zolpidem (AMBIEN) 10 MG tablet Take 10 mg by mouth at bedtime as needed for sleep.     No current facility-administered medications on file prior to visit.    No Known Allergies  Past Medical History  Diagnosis Date  . Overweight(278.02)   . Depression   . Acid reflux   . Diabetes mellitus without complication [facility](HCC)     Past Surgical History  Procedure Laterality Date  . Appendectomy  1988  . Esophagogastroduodenoscopy  05/26/2004    Dr. Arlyce DiceKaplan  . Laparoscopic gastric banding  08/2010    Family History  Problem Relation Age of Onset  . Heart disease Father   . Heart attack Father [id number]  . Diabetes Mellitus II Father   . Diabetes Mellitus II Mother   . Hepatitis B Mother     Social History   Social History  . Marital Status: Married    Spouse Name: N/A  . Number of Children: 2  . Years of Education: N/A   Occupational History  . POLICE OFFICER    Social History Main Topics  . Smoking status: Never Smoker   . Smokeless tobacco: Current User    Types: Chew     Comment: Pouches-3-4 pouches/day for 6-8 years-in the process of quitting  . Alcohol Use: Yes       Comment: Twice a year.  . Drug Use: No     Comment: Caffiene: None  . Sexual Activity:    Partners: Female     Comment: Patient reports some decreased libido   Other Topics Concern  . Not on file   Social History Narrative   The PMH, PSH, Social History, Family History, Medications, and allergies [facility], and have been updated if relevant.   Review of Systems  Constitutional: Negative.   HENT: Negative.   Respiratory: Negative.   Cardiovascular: Negative.   Genitourinary: Negative.   Musculoskeletal: Negative.   Skin: Negative.   Neurological: Negative.   Hematological: Negative.   Psychiatric/Behavioral: Negative.   All other systems reviewed and are negative.      Objective:    BP 128/66 mmHg  Pulse 89  Temp(Src) 98.1 F (36.7 C) (Oral)  Wt 183 lb 8 oz (83.235 kg)  SpO2 98%   Physical Exam  Constitutional: He is oriented to person, place, and time. He appears well-developed and well-nourished. No distress.  HENT:  Head: Normocephalic.  Eyes: Conjunctivae are normal.  Cardiovascular: Normal rate.   Pulmonary/Chest: Effort normal.  Musculoskeletal: Normal range of motion.  Neurological: He is alert  and oriented to person, place, and time. No cranial nerve deficit.  Skin: Skin is warm and dry.  Psychiatric: He has a normal mood and affect. His behavior is normal. Judgment and thought content normal.  Nursing note and vitals reviewed.         Assessment & Plan:   Tachycardia  CHEST WALL PAIN, ACUTE No Follow-up on file.

## 2015-03-21 NOTE — Progress Notes (Signed)
Pre visit review using our clinic review tool, if applicable. No additional management support is needed unless otherwise documented below in the visit note. 

## 2015-04-30 ENCOUNTER — Other Ambulatory Visit (HOSPITAL_COMMUNITY): Payer: Self-pay | Admitting: Psychiatry

## 2015-05-02 NOTE — Telephone Encounter (Signed)
Received medication request from [facility] for Trazodone 100mg . Per Dr. Gilmore LarocheAkhtar, medication request is denied. Pt will need to schedule an appt with clinic for med management. Pt was last seen in February 2016. LVM for pt to return call to clinic to schedule appt.

## 2015-05-10 ENCOUNTER — Encounter: Payer: Self-pay | Admitting: Physician Assistant

## 2015-05-10 ENCOUNTER — Ambulatory Visit: Payer: Self-pay | Admitting: Physician Assistant

## 2015-05-10 VITALS — BP 110/70 | HR 110 | Temp 98.5°F

## 2015-05-10 DIAGNOSIS — J069 Acute upper respiratory infection, unspecified: Secondary | ICD-10-CM

## 2015-05-10 MED ORDER — HYDROCOD POLST-CPM POLST ER 10-8 MG/5ML PO SUER: 5.0000 mL | Freq: Two times a day (BID) | ORAL | Status: DC | PRN

## 2015-05-10 MED ORDER — CEFDINIR 300 MG PO CAPS: 300.0000 mg | ORAL_CAPSULE | Freq: Two times a day (BID) | ORAL | Status: DC

## 2015-05-10 NOTE — Progress Notes (Signed)
S: C/o runny nose and congestion for 3 days, no fever, chills, cp/sob, v/d; mucus was green this am , cough is sporadic, keeping him awake at night  Using otc meds: robitussin  O: PE: vitals with elevated hr, same as on previous visits; nad, perrl eomi, normocephalic, tms dull, nasal mucosa red and swollen, throat injected, neck supple no lymph, lungs c t a, cv rrr, neuro intact  A:  Acute uri   P: augmentin 975mg  bid x 10d, tussionex 115 ml nr; drink fluids, continue regular meds , use otc meds of choice, return if not improving in 5 days, return earlier if worsening

## 2016-01-31 ENCOUNTER — Telehealth: Payer: Self-pay | Admitting: Family Medicine

## 2016-01-31 NOTE — Telephone Encounter (Signed)
Called pt - he declined to schedule cpe due to work schedule

## 2016-02-17 ENCOUNTER — Encounter (HOSPITAL_COMMUNITY): Payer: Self-pay

## 2017-04-09 ENCOUNTER — Encounter (HOSPITAL_COMMUNITY): Payer: Self-pay

## 2017-07-09 ENCOUNTER — Ambulatory Visit: Payer: BLUE CROSS/BLUE SHIELD | Admitting: Internal Medicine

## 2017-07-09 DIAGNOSIS — Z0289 Encounter for other administrative examinations: Secondary | ICD-10-CM

## 2017-07-09 NOTE — Progress Notes (Deleted)
HPI  Eric [name] presents to the clinic today to establish care and for management of the conditions listed below. He is transferring care from Dr. Dayton MartesAron.  GERD: Triggered by. He is taking Pantoprazole daily as prescribed. He denies breakthrough symptoms.   Anxiety and Depression: Triggered by. He is taking Effexor as prescribed. He denies SI/HI.  Insomnia: he has trouble/ He takes Trazadone nightly for good relief.  DM 2: His last A1C was 6.6%, 2016. He is not taking any diabetic medication at this time.  Past Medical History:  Diagnosis Date  . Acid reflux   . Depression   . Diabetes mellitus without complication (HCC)   . Overweight(278.02)     Current Outpatient Medications  Medication Sig Dispense Refill  . cefdinir (OMNICEF) 300 MG capsule Take 1 capsule (300 mg total) by mouth 2 (two) times daily. 20 capsule 0  . chlorpheniramine-HYDROcodone (TUSSIONEX PENNKINETIC ER) 10-8 MG/5ML SUER Take 5 mLs by mouth every 12 (twelve) hours as needed for cough. 115 mL 0  . pantoprazole (PROTONIX) 40 MG tablet Take 1 tablet (40 mg total) by mouth at bedtime. 30 tablet 11  . traZODone (DESYREL) 100 MG tablet Take 1.5 tablets (150 mg total) by mouth at bedtime as needed for sleep. 30 tablet 0  . Venlafaxine HCl 225 MG TB24 Take 1 tablet by mouth daily.    Marland Kitchen. zolpidem (AMBIEN) 10 MG tablet Take 10 mg by mouth at bedtime as needed for sleep.     No current facility-administered medications for this visit.     No Known Allergies  Family History  Problem Relation Age of Onset  . Heart disease Father   . Heart attack Father 3066  . Diabetes Mellitus II Father   . Diabetes Mellitus II Mother   . Hepatitis B Mother     Social History   Socioeconomic History  . Marital status: Married    Spouse name: Not on file  . Number of children: 2  . Years of education: Not on file  . Highest education level: Not on file  Social Needs  . Financial resource strain: Not on file  . Food insecurity - worry:  Not on file  . Food insecurity - inability: Not on file  . Transportation needs - medical: Not on file  . Transportation needs - non-medical: Not on file  Occupational History  . Occupation: POLICE OFFICER    Employer: [employer]  Tobacco Use  . Smoking status: Never Smoker  . Smokeless tobacco: Current User    Types: Chew  . Tobacco comment: Pouches-3-4 pouches/day for 6-8 years-in the process of quitting  Substance and Sexual Activity  . Alcohol use: Yes    Comment: Twice a year.  . Drug use: No    Comment: Caffiene: None  . Sexual activity: Yes    Partners: Female    Comment: Patient reports some decreased libido  Other Topics Concern  . Not on file  Social History Narrative  . Not on file    ROS:  Constitutional: Denies fever, malaise, fatigue, headache or abrupt weight changes.  HEENT: Denies eye pain, eye redness, ear pain, ringing in the ears, wax buildup, runny nose, nasal congestion, bloody nose, or sore throat. Respiratory: Denies difficulty breathing, shortness of breath, cough or sputum production.   Cardiovascular: Denies chest pain, chest tightness, palpitations or swelling in the hands or feet.  Gastrointestinal: Denies abdominal pain, bloating, constipation, diarrhea or blood in the stool.  GU: Denies frequency, urgency, pain  with urination, blood in urine, odor or discharge. Musculoskeletal: Denies decrease in range of motion, difficulty with gait, muscle pain or joint pain and swelling.  Skin: Denies redness, rashes, lesions or ulcercations.  Neurological: Denies dizziness, difficulty with memory, difficulty with speech or problems with balance and coordination.  Psych: Denies anxiety, depression, SI/HI.  No other specific complaints in a complete review of systems (except as listed in HPI above).  PE:  There were no vitals taken for this visit. Wt Readings from Last 3 Encounters:  03/21/15 183 lb 8 oz (83.2 kg)  03/14/15 189 lb 4 oz (85.8 kg)   03/11/15 175 lb (79.4 kg)    General: Appears their stated age, well developed, well nourished in NAD. HEENT: Head: normal shape and size; Eyes: sclera white, no icterus, conjunctiva pink, PERRLA and EOMs intact; Ears: Tm's gray and intact, normal light reflex;Throat/Mouth: Teeth present, mucosa pink and moist, no lesions or ulcerations noted.  Neck: Neck supple, trachea midline. No masses, lumps or thyromegaly present.  Cardiovascular: Normal rate and rhythm. S1,S2 noted.  No murmur, rubs or gallops noted. No JVD or BLE edema. No carotid bruits noted. Pulmonary/Chest: Normal effort and positive vesicular breath sounds. No respiratory distress. No wheezes, rales or ronchi noted.  Abdomen: Soft and nontender. Normal bowel sounds, no bruits noted. No distention or masses noted. Liver, spleen and kidneys non palpable. Musculoskeletal: Normal range of motion. Strength 5/5 BUE/BLE. No signs of joint swelling. No difficulty with gait.  Neurological: Alert and oriented. Cranial nerves II-XII grossly intact. Coordination normal.  Psychiatric: Mood and affect normal. Behavior is normal. Judgment and thought content normal.   EKG:  BMET    Component Value Date/Time   NA 135 03/11/2015 0940   NA 137 03/10/2015 1248   NA 140 08/06/2012 0437   K 3.9 03/11/2015 0940   K 3.7 08/06/2012 0437   CL 100 (L) 03/11/2015 0940   CL 108 (H) 08/06/2012 0437   CO2 28 03/11/2015 0940   CO2 27 08/06/2012 0437   GLUCOSE 93 03/11/2015 0940   GLUCOSE 96 08/06/2012 0437   GLUCOSE 122 08/28/2005   BUN 9 03/11/2015 0940   BUN 8 03/10/2015 1248   BUN 7 08/06/2012 0437   CREATININE 1.12 03/11/2015 0940   CREATININE 0.93 08/06/2012 0437   CALCIUM 9.1 03/11/2015 0940   CALCIUM 8.4 (L) 08/06/2012 0437   GFRNONAA >60 03/11/2015 0940   GFRNONAA >60 08/06/2012 0437   GFRAA >60 03/11/2015 0940   GFRAA >60 08/06/2012 0437    Lipid Panel     Component Value Date/Time   CHOL 154 08/24/2005   TRIG 52 08/24/2005    HDL 44 08/24/2005   LDLCALC 100 08/24/2005    CBC    Component Value Date/Time   WBC 7.4 03/11/2015 0940   RBC 4.97 03/11/2015 0940   HGB 15.1 03/11/2015 0940   HGB 16.5 03/10/2015 1248   HCT 43.7 03/11/2015 0940   HCT 47.0 03/10/2015 1248   PLT 258 03/11/2015 0940   PLT 253 03/10/2015 1248   MCV 87.9 03/11/2015 0940   MCV 89 03/10/2015 1248   MCV 88 08/06/2012 0437   MCH 30.4 03/11/2015 0940   MCHC 34.6 03/11/2015 0940   RDW 12.9 03/11/2015 0940   RDW 13.5 03/10/2015 1248   RDW 13.1 08/06/2012 0437   LYMPHSABS 1.8 03/11/2015 0940   LYMPHSABS 2.2 03/10/2015 1248   LYMPHSABS 2.0 08/06/2012 0437   MONOABS 0.7 03/11/2015 0940   MONOABS 0.6 08/06/2012 0437  EOSABS 0.2 03/11/2015 0940   EOSABS 0.2 03/10/2015 1248   EOSABS 0.2 08/06/2012 0437   BASOSABS 0.0 03/11/2015 0940   BASOSABS 0.0 03/10/2015 1248   BASOSABS 0.0 08/06/2012 0437    Hgb A1C Lab Results  Component Value Date   HGBA1C 6.6 (H) 02/19/2011     Assessment and Plan:

## 2018-03-31 ENCOUNTER — Encounter (HOSPITAL_COMMUNITY): Payer: Self-pay

## 2018-10-07 ENCOUNTER — Emergency Department: Payer: HRSA Program

## 2018-10-07 ENCOUNTER — Other Ambulatory Visit: Payer: Self-pay

## 2018-10-07 ENCOUNTER — Emergency Department
Admission: EM | Admit: 2018-10-07 | Discharge: 2018-10-07 | Disposition: A | Discharge status: Home / Self Care | Payer: HRSA Program | Attending: Emergency Medicine | Admitting: Emergency Medicine

## 2018-10-07 DIAGNOSIS — R509 Fever, unspecified: Secondary | ICD-10-CM | POA: Diagnosis present

## 2018-10-07 DIAGNOSIS — U071 COVID-19: Secondary | ICD-10-CM | POA: Diagnosis not present

## 2018-10-07 DIAGNOSIS — E119 Type 2 diabetes mellitus without complications: Secondary | ICD-10-CM | POA: Diagnosis not present

## 2018-10-07 HISTORY — DX: COVID-19: U07.1

## 2018-10-07 LAB — CBC WITH DIFFERENTIAL/PLATELET
Abs Immature Granulocytes: 0.06 10*3/uL (ref 0.00–0.07)
Basophils Absolute: 0 10*3/uL (ref 0.0–0.1)
Basophils Relative: 0 %
Eosinophils Absolute: 0.1 10*3/uL (ref 0.0–0.5)
Eosinophils Relative: 2 %
HCT: 42.3 % (ref 39.0–52.0)
Hemoglobin: 15.4 g/dL (ref 13.0–17.0)
Immature Granulocytes: 1 %
Lymphocytes Relative: 30 %
Lymphs Abs: 1.6 10*3/uL (ref 0.7–4.0)
MCH: 31.2 pg (ref 26.0–34.0)
MCHC: 36.4 g/dL — ABNORMAL HIGH (ref 30.0–36.0)
MCV: 85.6 fL (ref 80.0–100.0)
Monocytes Absolute: 0.6 10*3/uL (ref 0.1–1.0)
Monocytes Relative: 11 %
Neutro Abs: 2.9 10*3/uL (ref 1.7–7.7)
Neutrophils Relative %: 56 %
Platelets: 244 10*3/uL (ref 150–400)
RBC: 4.94 MIL/uL (ref 4.22–5.81)
RDW: 11.9 % (ref 11.5–15.5)
WBC: 5.2 10*3/uL (ref 4.0–10.5)
nRBC: 0 % (ref 0.0–0.2)

## 2018-10-07 LAB — COMPREHENSIVE METABOLIC PANEL
ALT: 25 U/L (ref 0–44)
AST: 24 U/L (ref 15–41)
Albumin: 4 g/dL (ref 3.5–5.0)
Alkaline Phosphatase: 62 U/L (ref 38–126)
Anion gap: 7 (ref 5–15)
BUN: 13 mg/dL (ref 6–20)
CO2: 25 mmol/L (ref 22–32)
Calcium: 8.7 mg/dL — ABNORMAL LOW (ref 8.9–10.3)
Chloride: 106 mmol/L (ref 98–111)
Creatinine, Ser: 0.97 mg/dL (ref 0.61–1.24)
GFR calc Af Amer: >60 mL/min (ref >60)
GFR calc non Af Amer: >60 mL/min (ref >60)
Glucose, Bld: 115 mg/dL — ABNORMAL HIGH (ref 70–99)
Potassium: 3.6 mmol/L (ref 3.5–5.1)
Sodium: 138 mmol/L (ref 135–145)
Total Bilirubin: 0.6 mg/dL (ref 0.3–1.2)
Total Protein: 7.7 g/dL (ref 6.5–8.1)

## 2018-10-07 LAB — TROPONIN I: Troponin I: <0.03 ng/mL (ref <0.03)

## 2018-10-07 NOTE — Discharge Instructions (Addendum)
The test that was done today look okay.  Please continue Tylenol for fever.  Please return for increasing shortness of breath or chest pressure.  Please follow-up with your primary care doctor.  Let him know that you were in the ER today.  Continue with your quarantine.  Dr. Letitia Libra may want you to follow-up with cardiology later.  At the present time your EKG troponin and chest x-ray are normal.

## 2018-10-07 NOTE — ED Triage Notes (Signed)
Pt to the er for no improvement since being diagnosed with covid 2 days ago. Pt states he has been sick since the 16th. Pt reports starting with the headache and now has pian in his chest . Fever was as high as 103. Fever this am was 100.9

## 2018-10-07 NOTE — ED Provider Notes (Signed)
[facility] Emergency Department Provider Note   ____________________________________________   First MD Initiated Contact with Patient 10/07/18 0126     (approximate)  I have reviewed the triage vital signs and the nursing notes.   HISTORY  Chief Complaint Chest Pain    HPI Eric [name] is a 43 y.o. male who is a known cause COVID positive patient.  Ports he is been having some fevers and chest heaviness since Sunday, yesterday afternoon.  Really has not gotten any worse.  He is not particularly short of breath.  There is no nausea or other symptoms associated with it.  He said his doctor told him to come in if he felt worse and since Sunday he has felt worse.  Is not any worse now than Sunday ago.         Past Medical History:  Diagnosis Date  . Acid reflux   . COVID-19   . Depression   . Diabetes mellitus without complication (HCC)   . Overweight(278.02)     Patient Active Problem List   Diagnosis Date Noted  . Anxiety and depression 11/02/2010  . DM 12/27/2009  . GERD 12/21/2008  . INSOMNIA 05/19/2008    Past Surgical History:  Procedure Laterality Date  . APPENDECTOMY  1988  . ESOPHAGOGASTRODUODENOSCOPY  05/26/2004   Dr. Arlyce DiceKaplan  . LAPAROSCOPIC GASTRIC BANDING  08/2010    Prior to Admission medications   Medication Sig Start Date End Date Taking? Authorizing Provider  cefdinir (OMNICEF) 300 MG capsule Take 1 capsule (300 mg total) by mouth 2 (two) times daily. 05/10/15   Sherrie MustacheFisher, Roselyn BeringSusan W, PA-C  chlorpheniramine-HYDROcodone (TUSSIONEX PENNKINETIC ER) 10-8 MG/5ML SUER Take 5 mLs by mouth every 12 (twelve) hours as needed for cough. 05/10/15   Sherrie MustacheFisher, Roselyn BeringSusan W, PA-C  pantoprazole (PROTONIX) 40 MG tablet Take 1 tablet (40 mg total) by mouth at bedtime. 03/21/15   Dianne DunAron, Talia M, MD  traZODone (DESYREL) 100 MG tablet Take 1.5 tablets (150 mg total) by mouth at bedtime as needed for sleep. 09/13/14   Thresa RossAkhtar, Nadeem, MD  Venlafaxine HCl 225 MG  TB24 Take 1 tablet by mouth daily.    [provider]  zolpidem (AMBIEN) 10 MG tablet Take 10 mg by mouth at bedtime as needed for sleep.    [provider]    Allergies Patient has no known allergies.  Family History  Problem Relation Age of Onset  . Heart disease Father   . Heart attack Father 4066  . Diabetes Mellitus II Father   . Diabetes Mellitus II Mother   . Hepatitis B Mother     Social History Social History   Tobacco Use  . Smoking status: Never Smoker  . Smokeless tobacco: Current User    Types: Chew  . Tobacco comment: Pouches-3-4 pouches/day for 6-8 years-in the process of quitting  Substance Use Topics  . Alcohol use: Yes    Comment: Twice a year.  . Drug use: No    Comment: Caffiene: None    Review of Systems  Constitutional:  fever/chills Eyes: No visual changes. ENT: No sore throat. Cardiovascular: Chest heaviness Respiratory: Denies shortness of breath. Gastrointestinal: No abdominal pain.  No nausea, no vomiting.  No diarrhea.  No constipation. Genitourinary: Negative for dysuria. Musculoskeletal: Negative for back pain. Skin: Negative for rash. Neurological: Negative for headaches, focal weakness   ____________________________________________   PHYSICAL EXAM:  VITAL SIGNS: ED Triage Vitals [10/07/18 0113]  Enc Vitals Group  BP      Pulse      Resp      Temp 99.1 F (37.3 C)     Temp Source Oral     SpO2      Weight 176 lb (79.8 kg)     Height  (1.651 m)     Head Circumference      Peak Flow      Pain Score 6     Pain Loc      Pain Edu?      Excl. in GC?     Constitutional: Alert and oriented. Well appearing and in no acute distress. Eyes: Conjunctivae are normal.  Head: Atraumatic. Nose: No congestion/rhinnorhea. Mouth/Throat: Mucous membranes are moist.  Oropharynx non-erythematous. Neck: No stridor. Cardiovascular: Normal rate, regular rhythm. Grossly normal heart sounds.  Good peripheral  circulation. Respiratory: Normal respiratory effort.  No retractions. Lungs CTAB. Gastrointestinal: Soft and nontender. No distention. No abdominal bruits. No CVA tenderness. Musculoskeletal: No lower extremity tenderness nor edema.   Neurologic:  Normal speech no focal neurological findings found Skin:  Skin is warm, dry and intact. No rash noted. Psychiatric: Mood and affect are normal. Speech and behavior are normal.  ____________________________________________   LABS (all labs ordered are listed, but only abnormal results are displayed)  Labs Reviewed  COMPREHENSIVE METABOLIC PANEL - Abnormal; Notable for the following components:      Result Value   Glucose, Bld 115 (*)    Calcium 8.7 (*)    All other components within normal limits  CBC WITH DIFFERENTIAL/PLATELET - Abnormal; Notable for the following components:   MCHC 36.4 (*)    All other components within normal limits  TROPONIN I  URINALYSIS, COMPLETE (UACMP) WITH MICROSCOPIC   ____________________________________________  EKG  AG read interpreted by me shows normal sinus rhythm rate of 84 normal axis essentially normal EKG ____________________________________________  RADIOLOGY  ED MD interpretation: Rest x-ray read by me as negative although not a deep inspiration will refer the radiologist report  Official radiology report(s): Dg Chest Portable 1 View  Result Date: 10/07/2018 CLINICAL DATA:  Chest pain EXAM: PORTABLE CHEST 1 VIEW COMPARISON:  03/11/2015 FINDINGS: Cardiac shadows within normal limits. Poor inspiratory effort is noted with mild bibasilar atelectatic changes. No focal infiltrate is seen. Gastric lap band is noted. No bony abnormality is seen. IMPRESSION: Mild bibasilar atelectasis. Electronically Signed   By: Alcide Clever M.D.   On: 10/07/2018 01:58    ____________________________________________   PROCEDURES  Procedure(s) performed (including Critical Care):  Procedures    ____________________________________________   INITIAL IMPRESSION / ASSESSMENT AND PLAN / ED COU    Patient having symptoms for 2 days.  EKG troponin and chest x-ray are negative.  With this duration of chest discomfort there is no need for a second troponin.  I will have the patient follow-up with his primary care doctor.  If he gets worse he will return.  Primary care may elect to have him follow-up with cardiology as well.  At this point with his negative test I do not think he will need to follow-up with cardiology in the morning.         ____________________________________________   FINAL CLINICAL IMPRESSION(S) / ED DIAGNOSES  Final diagnoses:  COVID-19 virus infection     ED Discharge Orders    None       Note:  This document was prepared using Dragon voice recognition software and may include unintentional dictation errors.    [place],  Bebe Shaggy, MD 10/07/18 [phone]

## 2018-10-07 NOTE — ED Notes (Signed)
Patient AAOx4. Vitals Stable. NAD. 

## 2019-03-20 ENCOUNTER — Emergency Department: Payer: Worker's Compensation

## 2019-03-20 ENCOUNTER — Other Ambulatory Visit: Payer: Self-pay

## 2019-03-20 ENCOUNTER — Emergency Department
Admission: EM | Admit: 2019-03-20 | Discharge: 2019-03-20 | Disposition: A | Discharge status: Home / Self Care | Payer: Worker's Compensation | Attending: Emergency Medicine | Admitting: Emergency Medicine

## 2019-03-20 ENCOUNTER — Encounter: Payer: Self-pay | Admitting: Intensive Care

## 2019-03-20 DIAGNOSIS — W208XXA Other cause of strike by thrown, projected or falling object, initial encounter: Secondary | ICD-10-CM | POA: Diagnosis not present

## 2019-03-20 DIAGNOSIS — S098XXA Other specified injuries of head, initial encounter: Secondary | ICD-10-CM | POA: Insufficient documentation

## 2019-03-20 DIAGNOSIS — E119 Type 2 diabetes mellitus without complications: Secondary | ICD-10-CM | POA: Diagnosis not present

## 2019-03-20 DIAGNOSIS — Y9289 Other specified places as the place of occurrence of the external cause: Secondary | ICD-10-CM | POA: Diagnosis not present

## 2019-03-20 DIAGNOSIS — M791 Myalgia, unspecified site: Secondary | ICD-10-CM | POA: Insufficient documentation

## 2019-03-20 DIAGNOSIS — Y939 Activity, unspecified: Secondary | ICD-10-CM | POA: Diagnosis not present

## 2019-03-20 DIAGNOSIS — Y999 Unspecified external cause status: Secondary | ICD-10-CM | POA: Insufficient documentation

## 2019-03-20 DIAGNOSIS — S0990XA Unspecified injury of head, initial encounter: Secondary | ICD-10-CM

## 2019-03-20 DIAGNOSIS — S060X1A Concussion with loss of consciousness of 30 minutes or less, initial encounter: Secondary | ICD-10-CM | POA: Insufficient documentation

## 2019-03-20 DIAGNOSIS — Z79899 Other long term (current) drug therapy: Secondary | ICD-10-CM | POA: Insufficient documentation

## 2019-03-20 DIAGNOSIS — M7918 Myalgia, other site: Secondary | ICD-10-CM

## 2019-03-20 MED ORDER — CYCLOBENZAPRINE HCL 5 MG PO TABS: 5.0000 mg | ORAL_TABLET | Freq: Three times a day (TID) | ORAL | 0 refills | Status: DC | PRN

## 2019-03-20 MED ORDER — CYCLOBENZAPRINE HCL 10 MG PO TABS
10.0000 mg | ORAL_TABLET | Freq: Once | ORAL | Status: AC
  Administered 2019-03-20: 10 mg via ORAL
  Filled 2019-03-20: qty 1

## 2019-03-20 MED ORDER — HYDROCODONE-ACETAMINOPHEN 5-325 MG PO TABS: 1.0000 | ORAL_TABLET | Freq: Four times a day (QID) | ORAL | 0 refills | Status: AC | PRN

## 2019-03-20 MED ORDER — METOCLOPRAMIDE HCL 5 MG PO TABS: 5.0000 mg | ORAL_TABLET | Freq: Three times a day (TID) | ORAL | 0 refills | Status: DC | PRN

## 2019-03-20 MED ORDER — HYDROCODONE-ACETAMINOPHEN 5-325 MG PO TABS
1.0000 | ORAL_TABLET | Freq: Once | ORAL | Status: AC
  Administered 2019-03-20: 17:00:00 1 via ORAL
  Filled 2019-03-20: qty 1

## 2019-03-20 MED ORDER — METOCLOPRAMIDE HCL 10 MG PO TABS
10.0000 mg | ORAL_TABLET | Freq: Once | ORAL | Status: AC
  Administered 2019-03-20: 17:00:00 10 mg via ORAL
  Filled 2019-03-20: qty 1

## 2019-03-20 NOTE — ED Notes (Signed)
Pt reports injury happened yesterday but was never seen for it. PERRLA. No distress noted.

## 2019-03-20 NOTE — Discharge Instructions (Addendum)
Your exam and CT scans are normal following your accident. You do have symptoms of a concussion. Take the prescription meds as directed. You may take OTC Tylenol, in place of the hydrocodone, for non-drowsy pain relief. Follow-up with [facility] or your company's medical provider for ongoing symptoms. Return to the ED as needed.

## 2019-03-20 NOTE — ED Triage Notes (Signed)
Patient was hit in back of head at work with a big metal piece of equipment. C/o pain in head, forehead, and down his back. Patients co-workers reports he passed out for about a minute. Works at [employer]. This is workmans comp.

## 2019-03-20 NOTE — ED Notes (Signed)
Pt reports was at work and a pipe fell on his head. Pt reports positive LOC but is unsure of how long he was out. Pt reports the pipe hit on top of his head. No obvious bruising noted.

## 2019-03-20 NOTE — ED Provider Notes (Addendum)
[facility] Emergency Department Provider Note ____________________________________________  Time seen: 1550  I have reviewed the triage vital signs and the nursing notes.  HISTORY  Chief Complaint  Head Injury  HPI Eric [name] is a 43 y.o. male presents to the ED, accompanied by his family member, for evaluation of injuries following a head contusion at worksite.  Patient's family reports that  the accident actually occurred on Wednesday, 2 days prior to presentation.  Patient was at a worksite in [place], when apparently a large 10 foot section of 20 inch diameter aluminum duct piping fell on his head.  The patient was using a hand crank lift to raise the section of typing, when it rolled off.  Patient sustained a contusion to the left side of his forehead, and according to witnesses at the worksite, was unconscious for about a minute.  EMS were called to the scene, the patient was available at the time of injury, but declined transport of further evaluation.  He continued to work throughout the day, and the next day.  He finally presents today at the urging of his family member.  He presented initially to a local urgent care, and was referred to the ED for further evaluation.  Patient describes ongoing intermittent headache, nausea without vomiting, and pain in his upper back.  He denies any visual disturbance, dizziness, vertigo, vision loss, or distal paresthesias.  Past Medical History:  Diagnosis Date  . Acid reflux   . COVID-19   . Depression   . Overweight(278.02)     Patient Active Problem List   Diagnosis Date Noted  . Anxiety and depression 11/02/2010  . DM 12/27/2009  . GERD 12/21/2008  . INSOMNIA 05/19/2008    Past Surgical History:  Procedure Laterality Date  . APPENDECTOMY  1988  . ESOPHAGOGASTRODUODENOSCOPY  05/26/2004   Dr. Deatra Ina  . LAPAROSCOPIC GASTRIC BANDING  08/2010    Prior to Admission medications   Medication Sig Start  Date End Date Taking? Authorizing Provider  cyclobenzaprine (FLEXERIL) 5 MG tablet Take 1 tablet (5 mg total) by mouth 3 (three) times daily as needed. 03/20/19   Elga Santy, Dannielle Karvonen, PA-C  HYDROcodone-acetaminophen (NORCO) 5-325 MG tablet Take 1 tablet by mouth every 6 (six) hours as needed for up to 3 days. 03/20/19 03/23/19  Hazell Siwik, Dannielle Karvonen, PA-C  metoCLOPramide (REGLAN) 5 MG tablet Take 1 tablet (5 mg total) by mouth every 8 (eight) hours as needed for up to 5 days for nausea or vomiting. 03/20/19 03/25/19  Keshawn Sundberg, Dannielle Karvonen, PA-C  pantoprazole (PROTONIX) 40 MG tablet Take 1 tablet (40 mg total) by mouth at bedtime. 03/21/15   Lucille Passy, MD  traZODone (DESYREL) 100 MG tablet Take 1.5 tablets (150 mg total) by mouth at bedtime as needed for sleep. 09/13/14   Merian Capron, MD  Venlafaxine HCl 225 MG TB24 Take 1 tablet by mouth daily.    [provider]  zolpidem (AMBIEN) 10 MG tablet Take 10 mg by mouth at bedtime as needed for sleep.    [provider]    Allergies Patient has no known allergies.  Family History  Problem Relation Age of Onset  . Heart disease Father   . Heart attack Father 6  . Diabetes Mellitus II Father   . Diabetes Mellitus II Mother   . Hepatitis B Mother     Social History Social History   Tobacco Use  . Smoking status: Never Smoker  .  Smokeless tobacco: Current User    Types: Chew  . Tobacco comment: Pouches-3-4 pouches/day for 6-8 years-in the process of quitting  Substance Use Topics  . Alcohol use: Yes    Comment: Twice a year.  . Drug use: No    Comment: Caffiene: None    Review of Systems  Constitutional: Negative for fever. Eyes: Negative for visual changes. ENT: Negative for sore throat. Cardiovascular: Negative for chest pain. Respiratory: Negative for shortness of breath. Gastrointestinal: Negative for abdominal pain, vomiting and diarrhea. Genitourinary: Negative for dysuria. Musculoskeletal:  Negative for back pain. Skin: Negative for rash. Facial contusion.  Neurological: Negative for focal weakness or numbness. Reports intermittent headaches.  ____________________________________________  PHYSICAL EXAM:  VITAL SIGNS: ED Triage Vitals  Enc Vitals Group     BP 03/20/19 1515 131/88     Pulse Rate 03/20/19 1515 84     Resp 03/20/19 1515 16     Temp 03/20/19 1515 98.4 F (36.9 C)     Temp Source 03/20/19 1515 Oral     SpO2 03/20/19 1515 100 %     Weight 03/20/19 1516 183 lb (83 kg)     Height 03/20/19 1516 5\' 5"  (1.651 m)     Head Circumference --      Peak Flow --      Pain Score 03/20/19 1515 9     Pain Loc --      Pain Edu? --      Excl. in GC? --     Constitutional: Alert and oriented. Well appearing and in no distress. GCS= 15 Head: Normocephalic and atraumatic. Resolved forehead STS noted Eyes: Conjunctivae are normal. PERRL. Normal extraocular movements and fundi bilaterally. Ears: Canals clear. TMs intact bilaterally. Nose: No congestion/rhinorrhea/epistaxis. Mouth/Throat: Mucous membranes are moist. Neck: Supple.  Decreased range of motion secondary to musculoskeletal pain.  No distracting midline tenderness is elicited. Cardiovascular: Normal rate, regular rhythm. Normal distal pulses. Respiratory: Normal respiratory effort. No wheezes/rales/rhonchi. Gastrointestinal: Soft and nontender. No distention. Musculoskeletal: Nontender with normal range of motion in all extremities.  Neurologic: Cranial nerves II through XII grossly intact.  Normal UE/LE DTRs bilaterally.  Normal finger-to-nose exam.  Negative pronator drift.  Normal tandem walk.  Normal gait without ataxia. Normal speech and language. No gross focal neurologic deficits are appreciated. Skin:  Skin is warm, dry and intact. No rash noted. Psychiatric: Mood and affect are normal. Patient exhibits appropriate insight and judgment. ____________________________________________  EKG  See EKG  Report ____________________________________________   RADIOLOGY  CT Head/C-Spine w/o CM IMPRESSION: 1.  No acute intracranial pathology.  2. No fracture or static subluxation of the cervical spine. Mild multilevel disc space height loss and osteophytosis. ____________________________________________  PROCEDURES  Hydrocodone 5-325 mg PO Cyclobenzaprine 10 mg PO Metoclopramide 10 mg PO Procedures ____________________________________________  INITIAL IMPRESSION / ASSESSMENT AND PLAN / ED COURSE  Patient with ED evaluation of continued headache and nausea following a contusion to the head.  Patient's exam is overall benign reassuring at this time.  No signs of any acute intracranial process.  CTs of the head and neck are also reassuring as it showed no acute findings.  Patient symptoms likely represent a mild postconcussive syndrome and a mild head/facial contusion.  Patient will be given head injury precautions and work restrictions as appropriate.  We will treat symptomatically for headache and nausea.  Patient is also given instructions to follow-up with the Worker's Comp. provider selected by his company for ongoing symptom management.  A work note  is provided for 1 day as requested, as the patient is generally not scheduled to work during the weekend.  Prescriptions for Flexeril, hydrocodone, and Reglan are provided for postconcussive symptom relief.  He will follow-up as discussed.  Patient and his wife verbalized understanding of discharge instructions.  Maudry MayhewDaniel W Millikin was evaluated in Emergency Department on 03/20/2019 for the symptoms described in the history of present illness. He was evaluated in the context of the global COVID-19 pandemic, which necessitated consideration that the patient might be at risk for infection with the SARS-CoV-2 virus that causes COVID-19. Institutional protocols and algorithms that pertain to the evaluation of patients at risk for COVID-19 are in a state  of rapid change based on information released by regulatory bodies including the CDC and federal and state organizations. These policies and algorithms were followed during the patient's care in the ED. ____________________________________________  FINAL CLINICAL IMPRESSION(S) / ED DIAGNOSES  Final diagnoses:  Closed head injury, initial encounter  Concussion with loss of consciousness of 30 minutes or less, initial encounter  Musculoskeletal pain      Abygayle Deltoro, Charlesetta IvoryJenise V Bacon, PA-C 03/20/19 1800    Rodolfo Gaster, Charlesetta IvoryJenise V Bacon, PA-C 03/20/19 1807    Chesley NoonJessup, Charles, MD 03/20/19 1836

## 2019-10-02 ENCOUNTER — Emergency Department
Admission: EM | Admit: 2019-10-02 | Discharge: 2019-10-02 | Disposition: A | Discharge status: Home / Self Care | Payer: BC Managed Care – PPO | Attending: Emergency Medicine | Admitting: Emergency Medicine

## 2019-10-02 ENCOUNTER — Other Ambulatory Visit: Payer: Self-pay

## 2019-10-02 DIAGNOSIS — F329 Major depressive disorder, single episode, unspecified: Secondary | ICD-10-CM

## 2019-10-02 DIAGNOSIS — F419 Anxiety disorder, unspecified: Secondary | ICD-10-CM | POA: Diagnosis not present

## 2019-10-02 DIAGNOSIS — Z79899 Other long term (current) drug therapy: Secondary | ICD-10-CM | POA: Diagnosis not present

## 2019-10-02 DIAGNOSIS — Z8616 Personal history of COVID-19: Secondary | ICD-10-CM | POA: Diagnosis not present

## 2019-10-02 DIAGNOSIS — F17220 Nicotine dependence, chewing tobacco, uncomplicated: Secondary | ICD-10-CM | POA: Insufficient documentation

## 2019-10-02 DIAGNOSIS — Z20822 Contact with and (suspected) exposure to covid-19: Secondary | ICD-10-CM | POA: Insufficient documentation

## 2019-10-02 DIAGNOSIS — F32A Depression, unspecified: Secondary | ICD-10-CM | POA: Insufficient documentation

## 2019-10-02 DIAGNOSIS — F320 Major depressive disorder, single episode, mild: Secondary | ICD-10-CM | POA: Diagnosis present

## 2019-10-02 LAB — CBC
HCT: 45.1 % (ref 39.0–52.0)
Hemoglobin: 15.7 g/dL (ref 13.0–17.0)
MCH: 30.4 pg (ref 26.0–34.0)
MCHC: 34.8 g/dL (ref 30.0–36.0)
MCV: 87.4 fL (ref 80.0–100.0)
Platelets: 354 10*3/uL (ref 150–400)
RBC: 5.16 MIL/uL (ref 4.22–5.81)
RDW: 12.4 % (ref 11.5–15.5)
WBC: 9.3 10*3/uL (ref 4.0–10.5)
nRBC: 0 % (ref 0.0–0.2)

## 2019-10-02 LAB — COMPREHENSIVE METABOLIC PANEL
ALT: 23 U/L (ref 0–44)
AST: 26 U/L (ref 15–41)
Albumin: 4.8 g/dL (ref 3.5–5.0)
Alkaline Phosphatase: 70 U/L (ref 38–126)
Anion gap: 9 (ref 5–15)
BUN: 16 mg/dL (ref 6–20)
CO2: 26 mmol/L (ref 22–32)
Calcium: 9.3 mg/dL (ref 8.9–10.3)
Chloride: 100 mmol/L (ref 98–111)
Creatinine, Ser: 1.33 mg/dL — ABNORMAL HIGH (ref 0.61–1.24)
GFR calc Af Amer: >60 mL/min (ref >60)
GFR calc non Af Amer: >60 mL/min (ref >60)
Glucose, Bld: 88 mg/dL (ref 70–99)
Potassium: 4.5 mmol/L (ref 3.5–5.1)
Sodium: 135 mmol/L (ref 135–145)
Total Bilirubin: 2.5 mg/dL — ABNORMAL HIGH (ref 0.3–1.2)
Total Protein: 8.8 g/dL — ABNORMAL HIGH (ref 6.5–8.1)

## 2019-10-02 LAB — ACETAMINOPHEN LEVEL: Acetaminophen (Tylenol), Serum: <10 ug/mL — ABNORMAL LOW (ref 10–30)

## 2019-10-02 LAB — URINE DRUG SCREEN, QUALITATIVE (ARMC ONLY)
Amphetamines, Ur Screen: NOT DETECTED
Barbiturates, Ur Screen: NOT DETECTED
Benzodiazepine, Ur Scrn: NOT DETECTED
Cannabinoid 50 Ng, Ur ~~LOC~~: POSITIVE — AB
Cocaine Metabolite,Ur ~~LOC~~: NOT DETECTED
MDMA (Ecstasy)Ur Screen: NOT DETECTED
Methadone Scn, Ur: NOT DETECTED
Opiate, Ur Screen: NOT DETECTED
Phencyclidine (PCP) Ur S: NOT DETECTED
Tricyclic, Ur Screen: NOT DETECTED

## 2019-10-02 LAB — ETHANOL: Alcohol, Ethyl (B): <10 mg/dL (ref <10)

## 2019-10-02 LAB — SARS CORONAVIRUS 2 BY RT PCR (HOSPITAL ORDER, PERFORMED IN ~~LOC~~ HOSPITAL LAB): SARS Coronavirus 2: NEGATIVE

## 2019-10-02 LAB — SALICYLATE LEVEL: Salicylate Lvl: <7 mg/dL — ABNORMAL LOW (ref 7.0–30.0)

## 2019-10-02 MED ORDER — PANTOPRAZOLE SODIUM 40 MG PO TBEC: 40.0000 mg | DELAYED_RELEASE_TABLET | Freq: Every day | ORAL | 11 refills | Status: DC

## 2019-10-02 MED ORDER — VENLAFAXINE HCL ER 75 MG PO CP24: 150.0000 mg | ORAL_CAPSULE | Freq: Every day | ORAL | 1 refills | Status: AC

## 2019-10-02 MED ORDER — LORAZEPAM 0.5 MG PO TABS
0.5000 mg | ORAL_TABLET | Freq: Once | ORAL | Status: AC
  Administered 2019-10-02: 0.5 mg via ORAL
  Filled 2019-10-02: qty 1

## 2019-10-02 NOTE — Discharge Instructions (Addendum)
Please seek medical attention and help for any thoughts about wanting to harm yourself, harm others, any concerning change in behavior, severe depression, inappropriate drug use or any other new or concerning symptoms. ° °

## 2019-10-02 NOTE — ED Notes (Signed)
Pt talking with psych att

## 2019-10-02 NOTE — ED Notes (Addendum)
Pt was given a sandwich tray and a cup of soda with ice, no straw or lid. Pt ate 100%.

## 2019-10-02 NOTE — ED Notes (Signed)
Pt requesting meds for anxiety and EDP notified

## 2019-10-02 NOTE — ED Notes (Addendum)
Pt given clothes to change for DC, security called to get pt's money from safe  Pt expressing dissatisfaction in the DC because "I thought they were going to prescribe something else, that shit ain't working" - referring to Dr Henriette Combs (Pt's PCP) order to come here, venlafaxine, and pt thinking that psych would prescribe additional meds

## 2019-10-02 NOTE — ED Notes (Signed)
Pt has belongings locked in safe. Key in ER pyxis.

## 2019-10-02 NOTE — ED Notes (Signed)
Pt given fluids as requested and DC follow up resouces

## 2019-10-02 NOTE — ED Provider Notes (Signed)
[facility] Emergency Department Provider Note   ____________________________________________   I have reviewed the triage vital signs and the nursing notes.   HISTORY  Chief Complaint Depression   History limited by: Not Limited   HPI Eric [name] is a 44 y.o. male who presents to the emergency department today from primary care doctor's office because of concerns for depression.  Patient states he has a fairly long history of depression.  He has not been committed once in the past.  He states for the past couple months he is felt increased depression.  Is been having a hard time sleeping.  He is not eating as much.  He did recently break-up with his girlfriend.  He states that he has had some thoughts about wanting to hurt himself but not as much as when he was committed previously.  He is on medication for depression.  He denies seeing a psychiatrist or therapist recently. Denies any fevers.   Records reviewed. Per medical record review patient has a history of depression, seen by his primary care   Past Medical History:  Diagnosis Date  . Acid reflux   . COVID-19   . Depression   . Overweight(278.02)     Patient Active Problem List   Diagnosis Date Noted  . Anxiety and depression 11/02/2010  . DM 12/27/2009  . GERD 12/21/2008  . INSOMNIA 05/19/2008    Past Surgical History:  Procedure Laterality Date  . APPENDECTOMY  1988  . ESOPHAGOGASTRODUODENOSCOPY  05/26/2004   Dr. Arlyce Dice  . LAPAROSCOPIC GASTRIC BANDING  08/2010    Prior to Admission medications   Medication Sig Start Date End Date Taking? Authorizing Provider  cyclobenzaprine (FLEXERIL) 5 MG tablet Take 1 tablet (5 mg total) by mouth 3 (three) times daily as needed. 03/20/19   Menshew, Charlesetta Ivory, PA-C  metoCLOPramide (REGLAN) 5 MG tablet Take 1 tablet (5 mg total) by mouth every 8 (eight) hours as needed for up to 5 days for nausea or vomiting. 03/20/19 03/25/19  Menshew, Charlesetta Ivory, PA-C  pantoprazole (PROTONIX) 40 MG tablet Take 1 tablet (40 mg total) by mouth at bedtime. 03/21/15   Dianne Dun, MD  traZODone (DESYREL) 100 MG tablet Take 1.5 tablets (150 mg total) by mouth at bedtime as needed for sleep. 09/13/14   Thresa Ross, MD  Venlafaxine HCl 225 MG TB24 Take 1 tablet by mouth daily.    [provider]  zolpidem (AMBIEN) 10 MG tablet Take 10 mg by mouth at bedtime as needed for sleep.    [provider]    Allergies Patient has no known allergies.  Family History  Problem Relation Age of Onset  . Heart disease Father   . Heart attack Father 41  . Diabetes Mellitus II Father   . Diabetes Mellitus II Mother   . Hepatitis B Mother     Social History Social History   Tobacco Use  . Smoking status: Never Smoker  . Smokeless tobacco: Current User    Types: Chew  . Tobacco comment: Pouches-3-4 pouches/day for 6-8 years-in the process of quitting  Substance Use Topics  . Alcohol use: Yes    Comment: Twice a year.  . Drug use: No    Comment: Caffiene: None    Review of Systems Constitutional: No fever/chills Eyes: No visual changes. ENT: No sore throat. Cardiovascular: Denies chest pain. Respiratory: Denies shortness of breath. Gastrointestinal: Decreased appetite.  Genitourinary: Negative for dysuria. Musculoskeletal: Negative for  back pain. Skin: Negative for rash. Neurological: Negative for headaches, focal weakness or numbness.  ____________________________________________   PHYSICAL EXAM:  VITAL SIGNS: ED Triage Vitals [10/02/19 1531]  Enc Vitals Group     BP (!) 150/82     Pulse Rate 98     Resp 16     Temp 98.5 F (36.9 C)     Temp Source Oral     SpO2 98 %     Weight 179 lb (81.2 kg)     Height 5\' 5"  (1.651 m)     Head Circumference      Peak Flow      Pain Score 0   Constitutional: Alert and oriented.  Eyes: Conjunctivae are normal.  ENT      Head: Normocephalic and atraumatic.      Nose: No  congestion/rhinnorhea.      Mouth/Throat: Mucous membranes are moist.      Neck: No stridor. Hematological/Lymphatic/Immunilogical: No cervical lymphadenopathy. Cardiovascular: Normal rate, regular rhythm.  No murmurs, rubs, or gallops. Respiratory: Normal respiratory effort without tachypnea nor retractions. Breath sounds are clear and equal bilaterally. No wheezes/rales/rhonchi. Gastrointestinal: Soft and non tender. No rebound. No guarding.  Genitourinary: Deferred Musculoskeletal: Normal range of motion in all extremities. No lower extremity edema. Neurologic:  Normal speech and language. No gross focal neurologic deficits are appreciated.  Skin:  Skin is warm, dry and intact. No rash noted. Psychiatric: Depressed.  ____________________________________________    LABS (pertinent positives/negatives)  Acetaminophen, salicylate, ethanol below threshold CBC wbc 9.3, hgb 15.7, plt 354 CMP wnl except cr 1.33, t pro 8.8, t bili 2.5 UDS positive cannabinoid ____________________________________________   EKG  None  ____________________________________________    RADIOLOGY  None  ____________________________________________   PROCEDURES  Procedures  ____________________________________________   INITIAL IMPRESSION / ASSESSMENT AND PLAN / ED COURSE  Pertinent labs & imaging results that were available during my care of the patient were reviewed by me and considered in my medical decision making (see chart for details).   Patient presented to the emergency department today from primary care doctor's office because of concerns for depression.  On exam patient does appear depressed.  He did mention occasional thoughts of wanting to harm himself although states that he has a good job and he has a son.  Patient was seen by psychiatry and they felt patient is safe to be discharged home.  I agree with this I do not feel patient requires inpatient mission at this time.  Will  discharge.  ____________________________________________   FINAL CLINICAL IMPRESSION(S) / ED DIAGNOSES  Final diagnoses:  Depression, unspecified depression type     Note: This dictation was prepared with Dragon dictation. Any transcriptional errors that result from this process are unintentional     Nance Pear, MD 10/02/19 2142

## 2019-10-02 NOTE — ED Notes (Signed)
Pt speaking with TTS 

## 2019-10-02 NOTE — ED Triage Notes (Signed)
Pt states he is here for depression, denies SI or HI. Takes meds, has missed them a few times.

## 2019-10-02 NOTE — Consult Note (Signed)
[facility] Consult   Reason for Consult: psych evaluation Referring Physician:  Dr. Archie Balboa Patient Identification: Eric [name] MRN:  [id number] Principal Diagnosis: Anxiety and depression Diagnosis:  Principal Problem:   Anxiety and depression   Total Time spent with patient: 45 minutes  Subjective:   Eric [name] is a 44 y.o. male patient admitted with "I am here for depression"  HPI: Eric [name], 44 y.o., male patient seen face to face by this provider; chart reviewed and consulted with Dr. Dwyane Dee on 10/02/19.  On evaluation Eric [name] reports that he recently broke up with his girlfriend about 3 weeks ago. As a result he says he has been becoming increasingly depressed. Pt states that he take 225mg  of venlafaxine which is prescribed by his primary care physician. At an appt today with his PCP he stated that he wanted a medication change.  His PCP recommended that he came to the er for a medication change.   During evaluation Eric [name] is laying on the bed talking to security, he is pleasant and engaging on approach. ;he  is alert/oriented x 4; calm/cooperative/depressed; and mood congruent with affect.  Patient is speaking in a clear tone at moderate volume, and normal pace; with good eye contact.  His thought process is coherent and relevant; There is no indication that he is currently responding to internal/external stimuli or experiencing delusional thought content.  Patient denies suicidal/self-harm/homicidal ideation and is able to contract for safety. Pt says that she would never hurt himself. He states that he has a 60 year old son and a thriving career.Patient was referred to [facility] for psychiatric care.  No evidence of psychosis, and paranoia.  Patient has remained calm throughout assessment and has answered questions appropriately.   Past Psychiatric History: Yes   Risk to Self: Suicidal Ideation: No Suicidal Intent: No Is patient at risk for  suicide?: No Suicidal Plan?: No Access to Means: No What has been your use of drugs/alcohol within the last 12 months?: None reported  How many times?: 0 Other Self Harm Risks: None reported  Triggers for Past Attempts: None known Intentional Self Injurious Behavior: None Risk to Others: Homicidal Ideation: No Thoughts of Harm to Others: No Current Homicidal Intent: No Current Homicidal Plan: No Access to Homicidal Means: No Identified Victim: n/a History of harm to others?: No Assessment of Violence: None Noted Violent Behavior Description: n/a Does patient have access to weapons?: No Criminal Charges Pending?: Yes Describe Pending Criminal Charges: Receiving stolen property  Does patient have a court date: Yes Court Date: (June, 2021) Prior Inpatient Therapy: Prior Inpatient Therapy: Yes Prior Therapy Dates: 2014-2015 Prior Therapy Facilty/Provider(s): [facility], [facility] Reason for Treatment: DEPRESSION Prior Outpatient Therapy: Prior Outpatient Therapy: Yes Prior Therapy Dates: 2014-2015 Prior Therapy Facilty/Provider(s): [facility] Reason for Treatment: DEPRESSION Does patient have an ACCT team?: No Does patient have Intensive In-House Services?  : No Does patient have [facility] services? : No Does patient have P4CC services?: No  Past Medical History:  Past Medical History:  Diagnosis Date  . Acid reflux   . COVID-19   . Depression   . Overweight(278.02)     Past Surgical History:  Procedure Laterality Date  . APPENDECTOMY  1988  . ESOPHAGOGASTRODUODENOSCOPY  05/26/2004   Dr. Deatra Ina  . LAPAROSCOPIC GASTRIC BANDING  08/2010   Family History:  Family History  Problem Relation Age of Onset  . Heart disease Father   . Heart attack Father 40  .  Diabetes Mellitus II Father   . Diabetes Mellitus II Mother   . Hepatitis B Mother    Family Psychiatric  History: unknown Social History:  Social History   Substance and Sexual Activity  Alcohol Use Yes   Comment: Twice a year.      Social History   Substance and Sexual Activity  Drug Use No   Comment: Caffiene: None    Social History   Socioeconomic History  . Marital status: Single    Spouse name: Not on file  . Number of children: 2  . Years of education: Not on file  . Highest education level: Not on file  Occupational History  . Occupation: POLICE OFFICER    Employer: [employer]  Tobacco Use  . Smoking status: Never Smoker  . Smokeless tobacco: Current User    Types: Chew  . Tobacco comment: Pouches-3-4 pouches/day for 6-8 years-in the process of quitting  Substance and Sexual Activity  . Alcohol use: Yes    Comment: Twice a year.  . Drug use: No    Comment: Caffiene: None  . Sexual activity: Yes    Partners: Female    Comment: Patient reports some decreased libido  Other Topics Concern  . Not on file  Social History Narrative  . Not on file   Social Determinants of Health   Financial Resource Strain:   . Difficulty of Paying Living Expenses:   Food Insecurity:   . Worried About Programme researcher, broadcasting/film/video in the Last Year:   . Barista in the Last Year:   Transportation Needs:   . Freight forwarder (Medical):   Marland Kitchen Lack of Transportation (Non-Medical):   Physical Activity:   . Days of Exercise per Week:   . Minutes of Exercise per Session:   Stress:   . Feeling of Stress :   Social Connections:   . Frequency of Communication with Friends and Family:   . Frequency of Social Gatherings with Friends and Family:   . Attends Religious Services:   . Active Member of Clubs or Organizations:   . Attends Banker Meetings:   Marland Kitchen Marital Status:    Additional Social History:    Allergies:  No Known Allergies  Labs:  Results for orders placed or performed during the hospital encounter of 10/02/19 (from the past 48 hour(s))  Comprehensive metabolic panel     Status: Abnormal   Collection Time: 10/02/19  3:33 PM  Result Value Ref Range   Sodium 135 135 - 145  mmol/L   Potassium 4.5 3.5 - 5.1 mmol/L   Chloride 100 98 - 111 mmol/L   CO2 26 22 - 32 mmol/L   Glucose, Bld 88 70 - 99 mg/dL    Comment: Glucose reference range applies only to samples taken after fasting for at least 8 hours.   BUN 16 6 - 20 mg/dL   Creatinine, Ser 7.51 (H) 0.61 - 1.24 mg/dL   Calcium 9.3 8.9 - 02.5 mg/dL   Total Protein 8.8 (H) 6.5 - 8.1 g/dL   Albumin 4.8 3.5 - 5.0 g/dL   AST 26 15 - 41 U/L   ALT 23 0 - 44 U/L   Alkaline Phosphatase 70 38 - 126 U/L   Total Bilirubin 2.5 (H) 0.3 - 1.2 mg/dL   GFR calc non Af Amer >60 >60 mL/min   GFR calc Af Amer >60 >60 mL/min   Anion gap 9 5 - 15  Comment: Performed at [facility], [address] [place], [place] [address]  Ethanol     Status: None   Collection Time: 10/02/19  3:33 PM  Result Value Ref Range   Alcohol, Ethyl (B) <10 <10 mg/dL    Comment: (NOTE) Lowest detectable limit for serum alcohol is 10 mg/dL. For medical purposes only. Performed at [facility], [address] [place], [place] [address]   Salicylate level     Status: Abnormal   Collection Time: 10/02/19  3:33 PM  Result Value Ref Range   Salicylate Lvl <7.0 (L) 7.0 - 30.0 mg/dL    Comment: Performed at [facility], [address] [place], [place] [address]  Acetaminophen level     Status: Abnormal   Collection Time: 10/02/19  3:33 PM  Result Value Ref Range   Acetaminophen (Tylenol), Serum <10 (L) 10 - 30 ug/mL    Comment: (NOTE) Therapeutic concentrations vary significantly. A range of 10-30 ug/mL  may be an effective concentration for many patients. However, some  are best treated at concentrations outside of this range. Acetaminophen concentrations >150 ug/mL at 4 hours after ingestion  and >50 ug/mL at 12 hours after ingestion are often associated with  toxic reactions. Performed at [facility], [address] [place], [place] [address]   cbc     Status: None   Collection Time: 10/02/19   3:33 PM  Result Value Ref Range   WBC 9.3 4.0 - 10.5 K/uL   RBC 5.16 4.22 - 5.81 MIL/uL   Hemoglobin 15.7 13.0 - 17.0 g/dL   HCT 25.9 56.3 - 87.5 %   MCV 87.4 80.0 - 100.0 fL   MCH 30.4 26.0 - 34.0 pg   MCHC 34.8 30.0 - 36.0 g/dL   RDW 64.3 32.9 - 51.8 %   Platelets 354 150 - 400 K/uL   nRBC 0.0 0.0 - 0.2 %    Comment: Performed at [facility], [address] [place], [place] [address]  Urine Drug Screen, Qualitative     Status: Abnormal   Collection Time: 10/02/19  3:33 PM  Result Value Ref Range   Tricyclic, Ur Screen NONE DETECTED NONE DETECTED   Amphetamines, Ur Screen NONE DETECTED NONE DETECTED   MDMA (Ecstasy)Ur Screen NONE DETECTED NONE DETECTED   Cocaine Metabolite,Ur [place] NONE DETECTED NONE DETECTED   Opiate, Ur Screen NONE DETECTED NONE DETECTED   Phencyclidine (PCP) Ur S NONE DETECTED NONE DETECTED   Cannabinoid 50 Ng, Ur [place] POSITIVE (A) NONE DETECTED   Barbiturates, Ur Screen NONE DETECTED NONE DETECTED   Benzodiazepine, Ur Scrn NONE DETECTED NONE DETECTED   Methadone Scn, Ur NONE DETECTED NONE DETECTED    Comment: (NOTE) Tricyclics + metabolites, urine    Cutoff 1000 ng/mL Amphetamines + metabolites, urine  Cutoff 1000 ng/mL MDMA (Ecstasy), urine              Cutoff 500 ng/mL Cocaine Metabolite, urine          Cutoff 300 ng/mL Opiate + metabolites, urine        Cutoff 300 ng/mL Phencyclidine (PCP), urine         Cutoff 25 ng/mL Cannabinoid, urine                 Cutoff 50 ng/mL Barbiturates + metabolites, urine  Cutoff 200 ng/mL Benzodiazepine, urine              Cutoff 200 ng/mL Methadone, urine  Cutoff 300 ng/mL The urine drug screen provides only a preliminary, unconfirmed analytical test result and should not be used for non-medical purposes. Clinical consideration and professional judgment should be applied to any positive drug screen result due to possible interfering substances. A more specific alternate chemical method must be  used in order to obtain a confirmed analytical result. Gas chromatography / mass spectrometry (GC/MS) is the preferred confirmat ory method. Performed at [facility], [address] [place], [place] [address]   SARS Coronavirus 2 by RT PCR (hospital order, performed in [facility] hospital lab) Nasopharyngeal Nasopharyngeal Swab     Status: None   Collection Time: 10/02/19  8:34 PM   Specimen: Nasopharyngeal Swab  Result Value Ref Range   SARS Coronavirus 2 NEGATIVE NEGATIVE    Comment: (NOTE) SARS-CoV-2 target nucleic acids are NOT DETECTED. The SARS-CoV-2 RNA is generally detectable in upper and lower respiratory specimens during the acute phase of infection. The lowest concentration of SARS-CoV-2 viral copies this assay can detect is 250 copies / mL. A negative result does not preclude SARS-CoV-2 infection and should not be used as the sole basis for treatment or other patient management decisions.  A negative result may occur with improper specimen collection / handling, submission of specimen other than nasopharyngeal swab, presence of viral mutation(s) within the areas targeted by this assay, and inadequate number of viral copies (<250 copies / mL). A negative result must be combined with clinical observations, patient history, and epidemiological information. Fact Sheet for Patients:   BoilerBrush.com.cyhttps://www.fda.gov/media/136312/download Fact Sheet for Healthcare Providers: https://pope.com/https://www.fda.gov/media/136313/download This test is not yet approved or cleared  by the [place] FDA and has been authorized for detection and/or diagnosis of SARS-CoV-2 by FDA under an Emergency Use Authorization (EUA).  This EUA will remain in effect (meaning this test can be used) for the duration of the COVID-19 declaration under Section 564(b)(1) of the Act, 21 U.S.C. section 360bbb-3(b)(1), unless the authorization is terminated or revoked sooner. Performed at [facility], [address] [place], [place] [address]     No current facility-administered medications for this encounter.   Current Outpatient Medications  Medication Sig Dispense Refill  . pantoprazole (PROTONIX) 40 MG tablet Take 1 tablet (40 mg total) by mouth at bedtime. (Patient taking differently: Take 40 mg by mouth daily. ) 30 tablet 11  . venlafaxine XR (EFFEXOR-XR) 75 MG 24 hr capsule Take 150 mg by mouth daily.     Marland Kitchen. zolpidem (AMBIEN) 10 MG tablet Take 10 mg by mouth at bedtime as needed for sleep.      Musculoskeletal: Strength & Muscle Tone: within normal limits Gait & Station: normal Patient leans: N/A  Psychiatric Specialty Exam: Physical Exam  Nursing note and vitals reviewed. Constitutional: He is oriented to person, place, and time. He appears well-developed.  HENT:  Head: Normocephalic.  Eyes: Pupils are equal, round, and reactive to light.  Respiratory: Effort normal.  Musculoskeletal:        General: Normal range of motion.     Cervical back: Normal range of motion.  Neurological: He is alert and oriented to person, place, and time.  Skin: Skin is warm and dry.  Psychiatric: His speech is normal and behavior is normal. Judgment and thought content normal. Cognition and memory are normal. He exhibits a depressed mood.    Review of Systems  Psychiatric/Behavioral: Positive for dysphoric mood. Negative for suicidal ideas. The patient is not nervous/anxious.   All other systems reviewed  and are negative.   Blood pressure 122/66, pulse 79, temperature 97.7 F (36.5 C), temperature source Oral, resp. rate 16, height 5\' 5"  (1.651 m), weight 81.2 kg, SpO2 95 %.Body mass index is 29.79 kg/m.  General Appearance: Casual  Eye Contact:  Good  Speech:  Clear and Coherent  Volume:  Normal  Mood:  Depressed  Affect:  Congruent  Thought Process:  Coherent and Descriptions of Associations: Intact  Orientation:  Full (Time, Place, and Person)  Thought Content:  WDL  Suicidal  Thoughts:  Yes.  without intent/plan  Homicidal Thoughts:  No  Memory:  Immediate;   Good  Judgement:  Good  Insight:  Good  Psychomotor Activity:  Normal  Concentration:  Concentration: Fair  Recall:  Good  Fund of Knowledge:  Good  Language:  Good  Akathisia:  NA  Handed:  Right  AIMS (if indicated):     Assets:  Communication Skills Social Support Vocational/Educational  ADL's:  Intact  Cognition:  WNL  Sleep:       Disposition: No evidence of imminent risk to self or others at present.   Patient does not meet criteria for psychiatric inpatient admission. Discussed crisis plan, support from social network, calling 911, coming to the Emergency Department, and calling Suicide Hotline. Pt is pchiatrically cleared and has been provided wit resourses to follow-up for psychiatric medication adjuatments  , NP 10/02/2019 10:17 PM

## 2019-10-02 NOTE — ED Notes (Addendum)
Red and blake Nike tennis shoes, white and grey socks, teal hat, black belt, black sunglasses, phone care, khaki shorts, blue underwear, Gold colored neckalace , white t shirt placed in labeled patient belonging bag.  $357.38 dollars and wallet placed into valuable envelope and given to Amy, Charity fundraiser. Security states that they will come and secure  envelope

## 2019-10-02 NOTE — BH Assessment (Signed)
Assessment Note  Eric [name] is an 44 y.o. male who presented to [facility] ED voluntarily for treatment. Per triage note, Pt states he is here for depression, denies SI or HI. Takes meds, has missed them a few times.   During TTS assessment pt presented pleasant, calm and oriented x 4. Pt confirmed the information provide to the triage RN. Pt identified his recent breakup with his girlfriend as the trigger for his depression and anxiety. Pt reports being referred by Dr. Wynetta Thom [facility]) to complete an evaluation after his appointment with him today due to his depression symptom (crying). Pt reports feeling as if his current medications are not working and to be looking for assistance with adjusting his medications. Pt reports to be sleeping 3-5 hours due to racing  thoughts and increased anxiety. Pt reports an inpatient hx with [facility] for depression 7 years ago and denied any previous or current outpatient services. Pt denied any substance use and reported his primary compliant to be medication adjustment. Pt reports endorsing fleeting SI when he is depressed but denies planning or previous attempts. Pt stated I am not opposed to inpatient treatment but I can't be here past Monday because I have to work". Pt denied any current SI/HI/AH/VH and provided his brother Eric [name], [phone]) as a collateral contact.   Disposition is pending psych consult.   Diagnosis: Major Depression Disorder   Past Medical History:  Past Medical History:  Diagnosis Date  . Acid reflux   . COVID-19   . Depression   . Overweight(278.02)     Past Surgical History:  Procedure Laterality Date  . APPENDECTOMY  1988  . ESOPHAGOGASTRODUODENOSCOPY  05/26/2004   Dr. Deatra Ina  . LAPAROSCOPIC GASTRIC BANDING  08/2010    Family History:  Family History  Problem Relation Age of Onset  . Heart disease Father   . Heart attack Father 47  . Diabetes Mellitus II Father   . Diabetes Mellitus II Mother   . Hepatitis B Mother      Social History:  reports that he has never smoked. His smokeless tobacco use includes chew. He reports current alcohol use. He reports that he does not use drugs.  Additional Social History:  Alcohol / Drug Use Pain Medications: see mar Prescriptions: see mar Over the Counter: see mar History of alcohol / drug use?: No history of alcohol / drug abuse  CIWA: CIWA-Ar BP: 111/62 Pulse Rate: 76 COWS:    Allergies: No Known Allergies  Home Medications: (Not in a hospital admission)   OB/GYN Status:  No LMP for male patient.  General Assessment Data Location of Assessment: [facility] ED TTS Assessment: In system Is this a Tele or Face-to-Face Assessment?: Face-to-Face Is this an Initial Assessment or a Re-assessment for this encounter?: Initial Assessment Patient Accompanied by:: N/A Language Other than English: No Living Arrangements: Other (Comment)(Private home ) What gender do you identify as?: Male Marital status: Single Maiden name: n/a Pregnancy Status: No Living Arrangements: Other relatives, Children Can pt return to current living arrangement?: Yes Admission Status: Voluntary Is patient capable of signing voluntary admission?: Yes Referral Source: Self/Family/Friend Insurance type: Blue cross   Medical Screening Exam ([place]) Medical Exam completed: Yes  Crisis Care Plan Living Arrangements: Other relatives, Children Name of Psychiatrist: Dr. Wynetta Mccaskill Name of Therapist: None reported   Education Status Is patient currently in school?: No Is the patient employed, unemployed or receiving disability?: Employed([employer] )  Risk to self with the past  6 months Suicidal Ideation: No Has patient been a risk to self within the past 6 months prior to admission? : No Suicidal Intent: No Has patient had any suicidal intent within the past 6 months prior to admission? : No Is patient at risk for suicide?: No Suicidal Plan?: No Has patient had any  suicidal plan within the past 6 months prior to admission? : No Access to Means: No What has been your use of drugs/alcohol within the last 12 months?: None reported  Previous Attempts/Gestures: No How many times?: 0 Other Self Harm Risks: None reported  Triggers for Past Attempts: None known Intentional Self Injurious Behavior: None Family Suicide History: No Recent stressful life event(s): Loss (Comment)(relationship with girlfriend ) Persecutory voices/beliefs?: No Depression: Yes Depression Symptoms: Insomnia Substance abuse history and/or treatment for substance abuse?: No Suicide prevention information given to non-admitted patients: Yes  Risk to Others within the past 6 months Homicidal Ideation: No Does patient have any lifetime risk of violence toward others beyond the six months prior to admission? : No Thoughts of Harm to Others: No Current Homicidal Intent: No Current Homicidal Plan: No Access to Homicidal Means: No Identified Victim: n/a History of harm to others?: No Assessment of Violence: None Noted Violent Behavior Description: n/a Does patient have access to weapons?: No Criminal Charges Pending?: Yes Describe Pending Criminal Charges: Receiving stolen property  Does patient have a court date: Yes Court Date: (June, 2021) Is patient on probation?: No  Psychosis Hallucinations: None noted Delusions: None noted  Mental Status Report Appearance/Hygiene: In scrubs Eye Contact: Fair Motor Activity: Unremarkable Speech: Logical/coherent Level of Consciousness: Alert Mood: Depressed, Anxious, Pleasant Affect: Anxious, Depressed Anxiety Level: Moderate Thought Processes: Coherent, Relevant Judgement: Partial Orientation: Person, Place, Time, Situation Obsessive Compulsive Thoughts/Behaviors: None  Cognitive Functioning Concentration: Normal Memory: Recent Intact, Remote Intact Is patient IDD: No Insight: Good Impulse Control: Good Appetite:  Fair Have you had any weight changes? : Loss Amount of the weight change? (lbs): (Pt reported to be unsure ) Sleep: Decreased Total Hours of Sleep: (3-5) Vegetative Symptoms: None  ADLScreening [facility] Assessment Services) Patient's cognitive ability adequate to safely complete daily activities?: Yes  Prior Inpatient Therapy Prior Inpatient Therapy: Yes Prior Therapy Dates: 2014-2015 Prior Therapy Facilty/Provider(s): [facility] Reason for Treatment: DEPRESSION  Prior Outpatient Therapy Prior Outpatient Therapy: Yes Prior Therapy Dates: 2014-2015 Prior Therapy Facilty/Provider(s): [facility] Reason for Treatment: DEPRESSION Does patient have an ACCT team?: No Does patient have Intensive In-House Services?  : No Does patient have [facility] services? : No Does patient have P4CC services?: No  ADL Screening (condition at time of admission) Patient's cognitive ability adequate to safely complete daily activities?: Yes Is the patient deaf or have difficulty hearing?: No Does the patient have difficulty seeing, even when wearing glasses/contacts?: No Does the patient have difficulty concentrating, remembering, or making decisions?: No Does the patient have difficulty dressing or bathing?: No Does the patient have difficulty walking or climbing stairs?: No Weakness of Legs: None Weakness of Arms/Hands: None  Home Assistive Devices/Equipment Home Assistive Devices/Equipment: None  Therapy Consults (therapy consults require a physician order) PT Evaluation Needed: No OT Evalulation Needed: No SLP Evaluation Needed: No Abuse/Neglect Assessment (Assessment to be complete while patient is alone) Abuse/Neglect Assessment Can Be Completed: Yes Physical Abuse: Denies Verbal Abuse: Denies Sexual Abuse: Denies Exploitation of patient/patient's resources: Denies Self-Neglect: Denies Values / Beliefs Cultural Requests During Hospitalization: None Spiritual Requests During Hospitalization:  None Consults Spiritual Care Consult Needed: No Transition  of Care Team Consult Needed: No Advance Directives (For Healthcare) Does Patient Have a Medical Advance Directive?: No          Disposition:  Disposition Initial Assessment Completed for this Encounter: Yes Patient referred to: Other (Comment)  On Site Evaluation by:   Reviewed with Physician:    Opal Sidles 10/02/2019 6:47 PM

## 2019-10-02 NOTE — ED Notes (Signed)
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

## 2019-10-02 NOTE — ED Notes (Signed)
Pharm with pt att for med rec

## 2019-10-03 NOTE — ED Notes (Signed)
Pt valuables returned from safe, pt acknowledges having all possessions

## 2019-10-03 NOTE — ED Notes (Signed)
No peripheral IV placed this visit.   Discharge instructions reviewed with patient. Questions fielded by this RN. Patient verbalizes understanding of instructions. Patient discharged home in stable condition per goodman. No acute distress noted at time of discharge.   Pt refused DC V/S

## 2020-08-21 ENCOUNTER — Inpatient Hospital Stay
Admission: EM | Admit: 2020-08-21 | Discharge: 2020-08-23 | DRG: 871 | Disposition: A | Discharge status: Home / Self Care | Payer: BC Managed Care – PPO | Attending: Internal Medicine | Admitting: Internal Medicine

## 2020-08-21 ENCOUNTER — Other Ambulatory Visit: Payer: Self-pay

## 2020-08-21 ENCOUNTER — Emergency Department: Payer: BC Managed Care – PPO

## 2020-08-21 DIAGNOSIS — A419 Sepsis, unspecified organism: Principal | ICD-10-CM | POA: Diagnosis present

## 2020-08-21 DIAGNOSIS — F1210 Cannabis abuse, uncomplicated: Secondary | ICD-10-CM | POA: Diagnosis present

## 2020-08-21 DIAGNOSIS — Z8249 Family history of ischemic heart disease and other diseases of the circulatory system: Secondary | ICD-10-CM

## 2020-08-21 DIAGNOSIS — F32A Depression, unspecified: Secondary | ICD-10-CM | POA: Diagnosis present

## 2020-08-21 DIAGNOSIS — J189 Pneumonia, unspecified organism: Secondary | ICD-10-CM | POA: Diagnosis present

## 2020-08-21 DIAGNOSIS — K219 Gastro-esophageal reflux disease without esophagitis: Secondary | ICD-10-CM | POA: Diagnosis not present

## 2020-08-21 DIAGNOSIS — Z8616 Personal history of COVID-19: Secondary | ICD-10-CM

## 2020-08-21 DIAGNOSIS — N179 Acute kidney failure, unspecified: Secondary | ICD-10-CM | POA: Diagnosis not present

## 2020-08-21 DIAGNOSIS — Z833 Family history of diabetes mellitus: Secondary | ICD-10-CM

## 2020-08-21 DIAGNOSIS — Z79899 Other long term (current) drug therapy: Secondary | ICD-10-CM

## 2020-08-21 DIAGNOSIS — R112 Nausea with vomiting, unspecified: Secondary | ICD-10-CM | POA: Diagnosis present

## 2020-08-21 DIAGNOSIS — F419 Anxiety disorder, unspecified: Secondary | ICD-10-CM | POA: Diagnosis present

## 2020-08-21 DIAGNOSIS — E119 Type 2 diabetes mellitus without complications: Secondary | ICD-10-CM | POA: Diagnosis present

## 2020-08-21 DIAGNOSIS — R6520 Severe sepsis without septic shock: Secondary | ICD-10-CM | POA: Diagnosis present

## 2020-08-21 DIAGNOSIS — G4700 Insomnia, unspecified: Secondary | ICD-10-CM | POA: Diagnosis present

## 2020-08-21 DIAGNOSIS — R197 Diarrhea, unspecified: Secondary | ICD-10-CM | POA: Diagnosis present

## 2020-08-21 DIAGNOSIS — Z20822 Contact with and (suspected) exposure to covid-19: Secondary | ICD-10-CM | POA: Diagnosis present

## 2020-08-21 LAB — URINE DRUG SCREEN, QUALITATIVE (ARMC ONLY)
Amphetamines, Ur Screen: NOT DETECTED
Barbiturates, Ur Screen: NOT DETECTED
Benzodiazepine, Ur Scrn: NOT DETECTED
Cannabinoid 50 Ng, Ur ~~LOC~~: POSITIVE — AB
Cocaine Metabolite,Ur ~~LOC~~: NOT DETECTED
MDMA (Ecstasy)Ur Screen: NOT DETECTED
Methadone Scn, Ur: NOT DETECTED
Opiate, Ur Screen: NOT DETECTED
Phencyclidine (PCP) Ur S: NOT DETECTED
Tricyclic, Ur Screen: NOT DETECTED

## 2020-08-21 LAB — CBC WITH DIFFERENTIAL/PLATELET
Abs Immature Granulocytes: 0.1 10*3/uL — ABNORMAL HIGH (ref 0.00–0.07)
Basophils Absolute: 0 10*3/uL (ref 0.0–0.1)
Basophils Relative: 0 %
Eosinophils Absolute: 0.1 10*3/uL (ref 0.0–0.5)
Eosinophils Relative: 1 %
HCT: 42.8 % (ref 39.0–52.0)
Hemoglobin: 15 g/dL (ref 13.0–17.0)
Immature Granulocytes: 1 %
Lymphocytes Relative: 4 %
Lymphs Abs: 0.7 10*3/uL (ref 0.7–4.0)
MCH: 29.7 pg (ref 26.0–34.0)
MCHC: 35 g/dL (ref 30.0–36.0)
MCV: 84.8 fL (ref 80.0–100.0)
Monocytes Absolute: 0.7 10*3/uL (ref 0.1–1.0)
Monocytes Relative: 3 %
Neutro Abs: 17.5 10*3/uL — ABNORMAL HIGH (ref 1.7–7.7)
Neutrophils Relative %: 91 %
Platelets: 283 10*3/uL (ref 150–400)
RBC: 5.05 MIL/uL (ref 4.22–5.81)
RDW: 12.3 % (ref 11.5–15.5)
WBC: 19.1 10*3/uL — ABNORMAL HIGH (ref 4.0–10.5)
nRBC: 0 % (ref 0.0–0.2)

## 2020-08-21 LAB — URINALYSIS, COMPLETE (UACMP) WITH MICROSCOPIC
Bacteria, UA: NONE SEEN
Bilirubin Urine: NEGATIVE
Glucose, UA: NEGATIVE mg/dL
Hgb urine dipstick: NEGATIVE
Ketones, ur: NEGATIVE mg/dL
Leukocytes,Ua: NEGATIVE
Nitrite: NEGATIVE
Protein, ur: NEGATIVE mg/dL
Specific Gravity, Urine: 1.019 (ref 1.005–1.030)
Squamous Epithelial / HPF: NONE SEEN (ref 0–5)
pH: 6 (ref 5.0–8.0)

## 2020-08-21 LAB — COMPREHENSIVE METABOLIC PANEL
ALT: 19 U/L (ref 0–44)
AST: 21 U/L (ref 15–41)
Albumin: 4.4 g/dL (ref 3.5–5.0)
Alkaline Phosphatase: 61 U/L (ref 38–126)
Anion gap: 8 (ref 5–15)
BUN: 19 mg/dL (ref 6–20)
CO2: 24 mmol/L (ref 22–32)
Calcium: 9 mg/dL (ref 8.9–10.3)
Chloride: 100 mmol/L (ref 98–111)
Creatinine, Ser: 1.3 mg/dL — ABNORMAL HIGH (ref 0.61–1.24)
GFR, Estimated: >60 mL/min (ref >60)
Glucose, Bld: 135 mg/dL — ABNORMAL HIGH (ref 70–99)
Potassium: 4.2 mmol/L (ref 3.5–5.1)
Sodium: 132 mmol/L — ABNORMAL LOW (ref 135–145)
Total Bilirubin: 1.4 mg/dL — ABNORMAL HIGH (ref 0.3–1.2)
Total Protein: 8.5 g/dL — ABNORMAL HIGH (ref 6.5–8.1)

## 2020-08-21 LAB — RESP PANEL BY RT-PCR (FLU A&B, COVID) ARPGX2
Influenza A by PCR: NEGATIVE
Influenza B by PCR: NEGATIVE
SARS Coronavirus 2 by RT PCR: NEGATIVE

## 2020-08-21 LAB — TROPONIN I (HIGH SENSITIVITY)
Troponin I (High Sensitivity): 4 ng/L (ref <18)
Troponin I (High Sensitivity): 4 ng/L (ref <18)

## 2020-08-21 LAB — LIPASE, BLOOD: Lipase: 33 U/L (ref 11–51)

## 2020-08-21 MED ORDER — SODIUM CHLORIDE 0.9 % IV SOLN
1.0000 g | Freq: Once | INTRAVENOUS | Status: AC
  Administered 2020-08-21: 1 g via INTRAVENOUS
  Filled 2020-08-21: qty 10

## 2020-08-21 MED ORDER — ACETAMINOPHEN 500 MG PO TABS
1000.0000 mg | ORAL_TABLET | Freq: Once | ORAL | Status: AC
  Administered 2020-08-21: 1000 mg via ORAL
  Filled 2020-08-21: qty 2

## 2020-08-21 MED ORDER — LACTATED RINGERS IV BOLUS (SEPSIS): 2000.0000 mL | Freq: Once | INTRAVENOUS | Status: DC

## 2020-08-21 MED ORDER — SODIUM CHLORIDE 0.9 % IV SOLN: 250.0000 mL | INTRAVENOUS | Status: DC | PRN

## 2020-08-21 MED ORDER — ACETAMINOPHEN 325 MG PO TABS: 650.0000 mg | ORAL_TABLET | Freq: Four times a day (QID) | ORAL | Status: DC | PRN

## 2020-08-21 MED ORDER — SODIUM CHLORIDE 0.9 % IV SOLN
500.0000 mg | Freq: Once | INTRAVENOUS | Status: AC
  Administered 2020-08-21: 500 mg via INTRAVENOUS
  Filled 2020-08-21: qty 500

## 2020-08-21 MED ORDER — ONDANSETRON HCL 4 MG/2ML IJ SOLN
4.0000 mg | Freq: Once | INTRAMUSCULAR | Status: AC
  Administered 2020-08-21: 4 mg via INTRAVENOUS
  Filled 2020-08-21: qty 2

## 2020-08-21 MED ORDER — IOHEXOL 300 MG/ML  SOLN
100.0000 mL | Freq: Once | INTRAMUSCULAR | Status: AC | PRN
  Administered 2020-08-21: 100 mL via INTRAVENOUS

## 2020-08-21 MED ORDER — HALOPERIDOL LACTATE 5 MG/ML IJ SOLN
5.0000 mg | Freq: Once | INTRAMUSCULAR | Status: AC
  Administered 2020-08-21: 5 mg via INTRAVENOUS
  Filled 2020-08-21: qty 1

## 2020-08-21 MED ORDER — KETOROLAC TROMETHAMINE 30 MG/ML IJ SOLN
30.0000 mg | Freq: Once | INTRAMUSCULAR | Status: AC
  Administered 2020-08-21: 30 mg via INTRAVENOUS
  Filled 2020-08-21: qty 1

## 2020-08-21 MED ORDER — POLYETHYLENE GLYCOL 3350 17 G PO PACK: 17.0000 g | PACK | Freq: Every day | ORAL | Status: DC | PRN

## 2020-08-21 MED ORDER — ENOXAPARIN SODIUM 40 MG/0.4ML ~~LOC~~ SOLN
40.0000 mg | SUBCUTANEOUS | Status: DC
  Filled 2020-08-21: qty 0.4

## 2020-08-21 MED ORDER — SODIUM CHLORIDE 0.9 % IV SOLN
500.0000 mg | Freq: Once | INTRAVENOUS | Status: AC
  Administered 2020-08-22: 500 mg via INTRAVENOUS
  Filled 2020-08-21: qty 500

## 2020-08-21 MED ORDER — SODIUM CHLORIDE 0.9 % IV BOLUS
1000.0000 mL | Freq: Once | INTRAVENOUS | Status: AC
  Administered 2020-08-21: 1000 mL via INTRAVENOUS

## 2020-08-21 MED ORDER — ACETAMINOPHEN 650 MG RE SUPP: 650.0000 mg | Freq: Four times a day (QID) | RECTAL | Status: DC | PRN

## 2020-08-21 MED ORDER — SODIUM CHLORIDE 0.9 % IV SOLN: Freq: Once | INTRAVENOUS | Status: AC

## 2020-08-21 MED ORDER — SODIUM CHLORIDE 0.9 % IV SOLN
1.0000 g | INTRAVENOUS | Status: DC
  Administered 2020-08-22: 1 g via INTRAVENOUS
  Filled 2020-08-21: qty 1
  Filled 2020-08-21: qty 10

## 2020-08-21 MED ORDER — SODIUM CHLORIDE 0.9% FLUSH: 3.0000 mL | INTRAVENOUS | Status: DC | PRN

## 2020-08-21 MED ORDER — SODIUM CHLORIDE 0.9% FLUSH
3.0000 mL | Freq: Two times a day (BID) | INTRAVENOUS | Status: DC
  Administered 2020-08-22 – 2020-08-23 (×2): 3 mL via INTRAVENOUS

## 2020-08-21 MED ORDER — SODIUM CHLORIDE 0.9% FLUSH: 3.0000 mL | Freq: Two times a day (BID) | INTRAVENOUS | Status: DC

## 2020-08-21 NOTE — ED Notes (Signed)
Late entry -- Entered room to find pt lying on R lateral side with eyes closed; pt opens eyes with verbal stimulation.  Answering questions appropriately and able to follow simple commands-- pt denies cp but does report acute onset HA (will request HA medication) with ongoing mild sob.  RR even and unlabored on RA however posterior lung sounds diminished (more prominently on L than R).  S1 and S2 regular upon auscultation.  Abdomen soft, nontender-no report of nausea at this time.  Skin warm dry and intact.  Will monitor for acute changes and maintain plan of care.  Visitor at bedside

## 2020-08-21 NOTE — Progress Notes (Signed)
CODE SEPSIS - PHARMACY COMMUNICATION  **Broad Spectrum Antibiotics should be administered within 1 hour of Sepsis diagnosis**  Time Code Sepsis Called/Page Received: 2218  Antibiotics Ordered: Ceftriaxone and Azithromycin  Time of 1st antibiotic administration: 1948   Otelia Sergeant, PharmD, MBA 08/21/2020 10:20 PM

## 2020-08-21 NOTE — ED Notes (Signed)
Along with chest pain, patient also c/o N/V/D and chills

## 2020-08-21 NOTE — ED Provider Notes (Signed)
[facility] Emergency Department Provider Note   ____________________________________________   Event Date/Time   First MD Initiated Contact with Patient 08/21/20 1325     (approximate)  I have reviewed the triage vital signs and the nursing notes.   HISTORY  Chief Complaint Chest Pain    HPI LIAN TANORI is a 45 y.o. male with a stated past medical history of acid reflux, depression, and cannabinoid abuse who presents for shortness of breath and central chest pain that began approximately 1.5 hours prior to arrival with associated diarrhea and nausea.  Patient explains that this is 10/10 pain that has no exacerbating or relieving factors.  Patient currently denies any vision changes, tinnitus, difficulty speaking, facial droop, sore throat, abdominal pain, vomiting, dysuria, or weakness/numbness/paresthesias in any extremity         Past Medical History:  Diagnosis Date  . Acid reflux   . COVID-19   . Depression   . Overweight(278.02)     Patient Active Problem List   Diagnosis Date Noted  . Depression   . Anxiety and depression 11/02/2010  . DM 12/27/2009  . GERD 12/21/2008  . INSOMNIA 05/19/2008    Past Surgical History:  Procedure Laterality Date  . APPENDECTOMY  1988  . ESOPHAGOGASTRODUODENOSCOPY  05/26/2004   Dr. Arlyce Dice  . LAPAROSCOPIC GASTRIC BANDING  08/2010    Prior to Admission medications   Medication Sig Start Date End Date Taking? Authorizing Provider  pantoprazole (PROTONIX) 40 MG tablet Take 1 tablet (40 mg total) by mouth at bedtime. 10/02/19   Phineas Semen, MD  venlafaxine XR (EFFEXOR-XR) 75 MG 24 hr capsule Take 2 capsules (150 mg total) by mouth daily. 10/02/19   Phineas Semen, MD  zolpidem (AMBIEN) 10 MG tablet Take 10 mg by mouth at bedtime as needed for sleep.    [provider]    Allergies Patient has no known allergies.  Family History  Problem Relation Age of Onset  . Heart disease Father    . Heart attack Father 34  . Diabetes Mellitus II Father   . Diabetes Mellitus II Mother   . Hepatitis B Mother     Social History Social History   Tobacco Use  . Smoking status: Never Smoker  . Smokeless tobacco: Current User    Types: Chew  . Tobacco comment: Pouches-3-4 pouches/day for 6-8 years-in the process of quitting  Substance Use Topics  . Alcohol use: Yes    Comment: Twice a year.  . Drug use: No    Comment: Caffiene: None    Review of Systems Constitutional: No fever/chills Eyes: No visual changes. ENT: No sore throat. Cardiovascular: Endorses chest pain. Respiratory: Endorses shortness of breath. Gastrointestinal: No abdominal pain.  Endorses nausea, no vomiting.  Endorses diarrhea. Genitourinary: Negative for dysuria. Musculoskeletal: Negative for acute arthralgias Skin: Negative for rash. Neurological: Negative for headaches, weakness/numbness/paresthesias in any extremity Psychiatric: Negative for suicidal ideation/homicidal ideation   ____________________________________________   PHYSICAL EXAM:  VITAL SIGNS: ED Triage Vitals  Enc Vitals Group     BP 08/21/20 1317 130/82     Pulse Rate 08/21/20 1317 (!) 120     Resp 08/21/20 1317 19     Temp 08/21/20 1317 (!) 100.5 F (38.1 C)     Temp Source 08/21/20 1317 Oral     SpO2 08/21/20 1317 100 %     Weight 08/21/20 1316 170 lb (77.1 kg)     Height 08/21/20 1316 5\' 2"  (1.575 m)  Head Circumference --      Peak Flow --      Pain Score 08/21/20 1313 10     Pain Loc --      Pain Edu? --      Excl. in GC? --    Constitutional: Alert and oriented. Well appearing and in no acute distress. Eyes: Conjunctivae are normal. PERRL. Head: Atraumatic. Nose: No congestion/rhinnorhea. Mouth/Throat: Mucous membranes are moist. Neck: No stridor Cardiovascular: Grossly normal heart sounds.  Good peripheral circulation. Respiratory: Normal respiratory effort.  No retractions. Gastrointestinal: Soft and  nontender. No distention. Musculoskeletal: No obvious deformities Neurologic:  Normal speech and language. No gross focal neurologic deficits are appreciated. Skin:  Skin is warm and dry. No rash noted. Psychiatric: Mood and affect are normal. Speech and behavior are normal.  ____________________________________________   LABS (all labs ordered are listed, but only abnormal results are displayed)  Labs Reviewed  COMPREHENSIVE METABOLIC PANEL - Abnormal; Notable for the following components:      Result Value   Sodium 132 (*)    Glucose, Bld 135 (*)    Creatinine, Ser 1.30 (*)    Total Protein 8.5 (*)    Total Bilirubin 1.4 (*)    All other components within normal limits  CBC WITH DIFFERENTIAL/PLATELET - Abnormal; Notable for the following components:   WBC 19.1 (*)    Neutro Abs 17.5 (*)    Abs Immature Granulocytes 0.10 (*)    All other components within normal limits  URINALYSIS, COMPLETE (UACMP) WITH MICROSCOPIC - Abnormal; Notable for the following components:   Color, Urine YELLOW (*)    APPearance CLEAR (*)    All other components within normal limits  URINE DRUG SCREEN, QUALITATIVE ([facility] ONLY) - Abnormal; Notable for the following components:   Cannabinoid 50 Ng, Ur [place] POSITIVE (*)    All other components within normal limits  RESP PANEL BY RT-PCR (FLU A&B, COVID) ARPGX2  LIPASE, BLOOD  TROPONIN I (HIGH SENSITIVITY)  TROPONIN I (HIGH SENSITIVITY)   ____________________________________________  EKG  ED ECG REPORT I, Merwyn Katos, the attending physician, personally viewed and interpreted this ECG.  Date: 08/21/2020 EKG Time: 1316 Rate: 124 Rhythm: normal sinus rhythm QRS Axis: normal Intervals: normal ST/T Wave abnormalities: normal Narrative Interpretation: no evidence of acute ischemia  ____________________________________________  RADIOLOGY  ED MD interpretation: 2 view chest x-ray shows subtle patchy airspace disease in the perihilar right midlung  suspicious for pneumonia  Official radiology report(s): DG Chest 2 View  Result Date: 08/21/2020 CLINICAL DATA:  Chest pain and shortness of breath. EXAM: CHEST - 2 VIEW COMPARISON:  10/07/2018 FINDINGS: 1343 hours. Low volume film. The cardiopericardial silhouette is within normal limits for size. Subtle patchy airspace disease identified in the parahilar right mid lung. Left lung clear. No pleural effusion. The visualized bony structures of the thorax show no acute abnormality. Lap band noted over the upper abdomen. IMPRESSION: Subtle patchy airspace disease identified in the parahilar right mid lung, suspicious for pneumonia. Electronically Signed   By: Kennith Center M.D.   On: 08/21/2020 14:41    ____________________________________________   PROCEDURES  Procedure(s) performed (including Critical Care):  Procedures   ____________________________________________   INITIAL IMPRESSION / ASSESSMENT AND PLAN / ED COURSE  As part of my medical decision making, I reviewed the following data within the electronic MEDICAL RECORD NUMBER Nursing notes reviewed and incorporated, Labs reviewed, EKG interpreted, Old chart reviewed, Radiograph reviewed and Notes from prior ED visits reviewed and incorporated  Patient is a 45 year old male that presents for shortness of breath, abdominal pain, diarrhea, nausea  Care of this patient will be signed out to the oncoming physician pending lab and radiology evaluation.      ____________________________________________   FINAL CLINICAL IMPRESSION(S) / ED DIAGNOSES  Final diagnoses:  None     ED Discharge Orders    None       Note:  This document was prepared using Dragon voice recognition software and may include unintentional dictation errors.   Merwyn Katos, MD 08/22/20 [phone]

## 2020-08-21 NOTE — ED Triage Notes (Addendum)
Pt comes with c/o SOB and central CP that started about hour and half ago. Pt states he was washing dishes when this started.  Pt states diarrhea, nausea. 10/10 pain  Pt also states chills, fever and headache.

## 2020-08-21 NOTE — H&P (Signed)
History and Physical   Eric [name] HYW:[id number] DOB: [birth year] DOA: 08/21/2020  PCP: Gracelyn Nurse, MD   Patient coming from: Home  Chief Complaint: Chest pain, shortness of breath  HPI: Eric [name] is a 45 y.o. male with medical history significant of anxiety, depression, diabetes, GERD, insomnia, cannabis use who presents with chest pain shortness of breath.  Patient presented earlier this afternoon after experiencing chest pain shortness of breath for about an hour and a half.  Chest pain was described as a 10 out of 10 pain with no aggravating or alleviating factors, which started while washing dishes.  He also has some associated nausea, vomiting, diarrhea. He states his pain has improved in the ED as well as his SOB. He has a headache with some photophobia, but no neck stiff ness nor pain. He also reports chills and fever at home. He denies noting, abdominal pain, constipation.  ED Course: Vital signs in the ED significant for temperature to 100.5, heart rate initially in the 120s now in the 90s to 100s, respiratory rate in the 20s, blood pressure initially in the 110s to 120s and now in the 90s systolic.  Lab work-up showed CMP with sodium 132, creatinine 1.3 possibly from baseline around 1, glucose 135, protein 8.5, T bili 1.4.  CBC with leukocytosis to 19.1.  Lipase normal, troponin negative x2, respiratory panel for flu and COVID negative.  Urinalysis normal, UDS positive for marijuana only.  Chest x-ray showed right perihilar patchy airspace disease suspicious for pneumonia.  CT abdomen and pelvis showed no acute abnormality.  Patient was started on ceftriaxone and azithromycin in the ED.  Review of Systems: As per HPI otherwise all other systems reviewed and are negative.  Past Medical History:  Diagnosis Date  . Acid reflux   . COVID-19   . Depression   . Overweight(278.02)     Past Surgical History:  Procedure Laterality Date  . APPENDECTOMY  1988  .  ESOPHAGOGASTRODUODENOSCOPY  05/26/2004   Dr. Arlyce Dice  . LAPAROSCOPIC GASTRIC BANDING  08/2010    Social History  reports that he has never smoked. His smokeless tobacco use includes chew. He reports current alcohol use. He reports that he does not use drugs.  No Known Allergies  Family History  Problem Relation Age of Onset  . Heart disease Father   . Heart attack Father 35  . Diabetes Mellitus II Father   . Diabetes Mellitus II Mother   . Hepatitis B Mother   Reviewed on admission  Prior to Admission medications   Medication Sig Start Date End Date Taking? Authorizing Provider  busPIRone (BUSPAR) 5 MG tablet Take 5 mg by mouth 2 (two) times daily. 08/15/20  Yes [provider]  pantoprazole (PROTONIX) 40 MG tablet Take 1 tablet (40 mg total) by mouth at bedtime. 10/02/19  Yes Phineas Semen, MD  venlafaxine XR (EFFEXOR-XR) 75 MG 24 hr capsule Take 2 capsules (150 mg total) by mouth daily. 10/02/19  Yes Phineas Semen, MD  zolpidem (AMBIEN) 10 MG tablet Take 10 mg by mouth at bedtime as needed for sleep.    [provider]    Physical Exam: Vitals:   08/21/20 1925 08/21/20 2000 08/21/20 2125 08/21/20 2130  BP: 112/79 (!) 96/56 92/64 90/61   Pulse: (!) 102 (!) 101 93 96  Resp: (!) 22 (!) 25 18 18   Temp: 99.7 F (37.6 C) 99.2 F (37.3 C)  99.3 F (37.4 C)  TempSrc:  Oral  Oral  SpO2: 92% 99% 92% 93%  Weight:      Height:       Physical Exam Constitutional:      General: He is not in acute distress.    Appearance: Normal appearance. He is ill-appearing.     Comments: Lying with eyes mostly closed due to headache with some photophobia.  Alert and oriented and answering questions appropriately.  HENT:     Head: Normocephalic and atraumatic.     Mouth/Throat:     Mouth: Mucous membranes are moist.     Pharynx: Oropharynx is clear.  Eyes:     Extraocular Movements: Extraocular movements intact.     Pupils: Pupils are equal, round, and reactive to light.   Cardiovascular:     Rate and Rhythm: Normal rate and regular rhythm.     Pulses: Normal pulses.     Heart sounds: Normal heart sounds.  Pulmonary:     Effort: Pulmonary effort is normal. No respiratory distress.     Breath sounds: Normal breath sounds.  Abdominal:     General: Bowel sounds are normal. There is no distension.     Palpations: Abdomen is soft.     Tenderness: There is no abdominal tenderness.  Musculoskeletal:        General: No swelling or deformity.  Skin:    General: Skin is warm and dry.  Neurological:     General: No focal deficit present.     Mental Status: Mental status is at baseline.    Labs on Admission: I have personally reviewed following labs and imaging studies  CBC: Recent Labs  Lab 08/21/20 1339  WBC 19.1*  NEUTROABS 17.5*  HGB 15.0  HCT 42.8  MCV 84.8  PLT 283    Basic Metabolic Panel: Recent Labs  Lab 08/21/20 1339  NA 132*  K 4.2  CL 100  CO2 24  GLUCOSE 135*  BUN 19  CREATININE 1.30*  CALCIUM 9.0    GFR: Estimated Creatinine Clearance: 64.6 mL/min (A) (by C-G formula based on SCr of 1.3 mg/dL (H)).  Liver Function Tests: Recent Labs  Lab 08/21/20 1339  AST 21  ALT 19  ALKPHOS 61  BILITOT 1.4*  PROT 8.5*  ALBUMIN 4.4    Urine analysis:    Component Value Date/Time   COLORURINE YELLOW (A) 08/21/2020 1355   APPEARANCEUR CLEAR (A) 08/21/2020 1355   APPEARANCEUR Clear 08/05/2012 0100   LABSPEC 1.019 08/21/2020 1355   LABSPEC 1.012 08/05/2012 0100   PHURINE 6.0 08/21/2020 1355   GLUCOSEU NEGATIVE 08/21/2020 1355   GLUCOSEU Negative 08/05/2012 0100   HGBUR NEGATIVE 08/21/2020 1355   BILIRUBINUR NEGATIVE 08/21/2020 1355   BILIRUBINUR Negative 03/10/2015 1410   BILIRUBINUR Negative 08/05/2012 0100   KETONESUR NEGATIVE 08/21/2020 1355   PROTEINUR NEGATIVE 08/21/2020 1355   UROBILINOGEN 0.2 03/10/2015 1410   NITRITE NEGATIVE 08/21/2020 1355   LEUKOCYTESUR NEGATIVE 08/21/2020 1355   LEUKOCYTESUR Trace  08/05/2012 0100    Radiological Exams on Admission: DG Chest 2 View  Result Date: 08/21/2020 CLINICAL DATA:  Chest pain and shortness of breath. EXAM: CHEST - 2 VIEW COMPARISON:  10/07/2018 FINDINGS: 1343 hours. Low volume film. The cardiopericardial silhouette is within normal limits for size. Subtle patchy airspace disease identified in the parahilar right mid lung. Left lung clear. No pleural effusion. The visualized bony structures of the thorax show no acute abnormality. Lap band noted over the upper abdomen. IMPRESSION: Subtle patchy airspace disease identified in the parahilar right mid lung, suspicious for  pneumonia. Electronically Signed   By: Kennith Center M.D.   On: 08/21/2020 14:41   CT Abdomen Pelvis W Contrast  Result Date: 08/21/2020 CLINICAL DATA:  45 year old male with abdominal pain and fever. EXAM: CT ABDOMEN AND PELVIS WITH CONTRAST TECHNIQUE: Multidetector CT imaging of the abdomen and pelvis was performed using the standard protocol following bolus administration of intravenous contrast. CONTRAST:  OMNIPAQUE IOHEXOL 300 MG/ML  SOLN COMPARISON:  None. FINDINGS: Lower chest: The visualized lung bases are clear. No intra-abdominal free air or free fluid. Hepatobiliary: No focal liver abnormality is seen. No gallstones, gallbladder wall thickening, or biliary dilatation. Pancreas: Unremarkable. No pancreatic ductal dilatation or surrounding inflammatory changes. Spleen: Normal in size without focal abnormality. Adrenals/Urinary Tract: Adrenal glands are unremarkable. Kidneys are normal, without renal calculi, focal lesion, or hydronephrosis. Bladder is unremarkable. Stomach/Bowel: There is a small hiatal hernia. A gastric lap band is noted. There is no bowel obstruction or active inflammation. Appendectomy. Vascular/Lymphatic: The abdominal aorta and IVC unremarkable. No portal venous gas. There is no adenopathy. Reproductive: The prostate and seminal vesicles are grossly  unremarkable. No pelvic mass. Other: None Musculoskeletal: No acute or significant osseous findings. IMPRESSION: No acute intra-abdominal or pelvic pathology. Electronically Signed   By: Elgie Collard M.D.   On: 08/21/2020 15:58    EKG: Independently reviewed.  Sinus tachycardia at 124 beats per.  Low voltage.  PACs noted.  Assessment/Plan Principal Problem:   Sepsis due to pneumonia [facility]) Active Problems:   GERD   Depression   AKI (acute kidney injury) (HCC)  Sepsis due to pneumonia > Patient presented with some chest pain and shortness of breath with some associated nausea, vomiting, diarrhea.  Found to have leukocytosis to 19 and evidence of possible pneumonia on chest x-ray in the right perihilar region. > No alternative explanation for his shortness of breath was found and CT abdomen pelvis did not show any other explanation for source of infection nor did UA.  Headache but no neck stiffness to indicate meningitis. > Given sudden onset possibility of PE, however this seems less likely as troponin was normal x2 and he is not hypoxic on room air. > Meets sepsis criteria given leukocytosis to 19.1, tachycardia in the ED, tachypnea in the ED, fever to 100.5.  Also reports fever and chills at home. > Patient had been improved in the ED however his blood pressure became soft down to the 90s systolic.  Lactic acid has not yet been checked. - Monitor on progressive - Continue with azithromycin and ceftriaxone - Complete full sepsis bolus for total of 3 L and continue IV fluids - Check and trend lactic acid - Obtain Blood Cultures - Trend fever curve and white count  AKI > Creatinine elevated to 1.3 from baseline of around 1. - IV fluids per sepsis bundle as above - Avoid nephrotoxic agents - Trend renal function and electrolytes  GERD -Continue home PPI  Anxiety Depression - Continue home venlafaxine and BuSpar  Diabetes > No longer has diabetes status post lap band  surgery  Marihuana Use - Noted  DVT prophylaxis: Lovenox  Code Status:   Full  Family Communication:  Brother updated at bedside Disposition Plan:   Patient is from:  Home  Anticipated DC to:  Home  Anticipated DC date:  1 to 3 days  Anticipated DC barriers: None  Consults called:  None  Admission status:  Observation, progressive   Severity of Illness: The appropriate patient status for this patient is  OBSERVATION. Observation status is judged to be reasonable and necessary in order to provide the required intensity of service to ensure the patient's safety. The patient's presenting symptoms, physical exam findings, and initial radiographic and laboratory data in the context of their medical condition is felt to place them at decreased risk for further clinical deterioration. Furthermore, it is anticipated that the patient will be medically stable for discharge from the hospital within 2 midnights of admission. The following factors support the patient status of observation.   " The patient's presenting symptoms include chest pain, shortness of breath, nausea, vomiting, diarrhea. " The physical exam findings include headache and photophobia. " The initial radiographic and laboratory data are leukocytosis to 19.1, creatinine 1.3, T bili 1.4, chest x-ray with right perihilar patchy airspace disease suspicious for pneumonia.  Normal CT abdomen pelvis.   Synetta Fail MD [facility]  How to contact the [facility] Attending or Consulting provider 7A - 7P or covering provider during after hours 7P -7A, for this patient?   1. Check the care team in [facility] and look for a) attending/consulting [facility] provider listed and b) the [facility] team listed 2. Log into [email or web address] and use 's universal password to access. If you do not have the password, please contact the hospital operator. 3. Locate the [facility] provider you are looking for under [facility] and page to a number that you can  be directly reached. 4. If you still have difficulty reaching the provider, please page the [facility] (Director on Call) for the Hospitalists listed on amion for assistance.  08/21/2020, 10:44 PM

## 2020-08-21 NOTE — ED Notes (Addendum)
Pt notified of hypotension - 92/64 (MAP 73) - per Dr Dallas Breeding ambulate pt and confirm how he feels ambulating.  Pt slightly unsteady gait while ambulating but does deny dizziness, worsening sob, cp or other acute/worsening symptoms with ambulation.  Dr Derrill Kay notified of unsteady gait and post ambulation bp 90/61 - plan to admit per Dr Derrill Kay

## 2020-08-22 DIAGNOSIS — N179 Acute kidney failure, unspecified: Secondary | ICD-10-CM | POA: Diagnosis present

## 2020-08-22 DIAGNOSIS — F1210 Cannabis abuse, uncomplicated: Secondary | ICD-10-CM | POA: Diagnosis present

## 2020-08-22 DIAGNOSIS — F32A Depression, unspecified: Secondary | ICD-10-CM | POA: Diagnosis present

## 2020-08-22 DIAGNOSIS — K219 Gastro-esophageal reflux disease without esophagitis: Secondary | ICD-10-CM | POA: Diagnosis present

## 2020-08-22 DIAGNOSIS — Z8616 Personal history of COVID-19: Secondary | ICD-10-CM | POA: Diagnosis not present

## 2020-08-22 DIAGNOSIS — E119 Type 2 diabetes mellitus without complications: Secondary | ICD-10-CM | POA: Diagnosis present

## 2020-08-22 DIAGNOSIS — Z833 Family history of diabetes mellitus: Secondary | ICD-10-CM | POA: Diagnosis not present

## 2020-08-22 DIAGNOSIS — R197 Diarrhea, unspecified: Secondary | ICD-10-CM | POA: Diagnosis present

## 2020-08-22 DIAGNOSIS — Z20822 Contact with and (suspected) exposure to covid-19: Secondary | ICD-10-CM | POA: Diagnosis present

## 2020-08-22 DIAGNOSIS — R112 Nausea with vomiting, unspecified: Secondary | ICD-10-CM | POA: Diagnosis present

## 2020-08-22 DIAGNOSIS — G4700 Insomnia, unspecified: Secondary | ICD-10-CM | POA: Diagnosis present

## 2020-08-22 DIAGNOSIS — J189 Pneumonia, unspecified organism: Secondary | ICD-10-CM | POA: Diagnosis present

## 2020-08-22 DIAGNOSIS — F419 Anxiety disorder, unspecified: Secondary | ICD-10-CM | POA: Diagnosis present

## 2020-08-22 DIAGNOSIS — Z79899 Other long term (current) drug therapy: Secondary | ICD-10-CM | POA: Diagnosis not present

## 2020-08-22 DIAGNOSIS — A419 Sepsis, unspecified organism: Secondary | ICD-10-CM | POA: Diagnosis present

## 2020-08-22 DIAGNOSIS — Z8249 Family history of ischemic heart disease and other diseases of the circulatory system: Secondary | ICD-10-CM | POA: Diagnosis not present

## 2020-08-22 DIAGNOSIS — R6520 Severe sepsis without septic shock: Secondary | ICD-10-CM | POA: Diagnosis present

## 2020-08-22 LAB — LACTIC ACID, PLASMA
Lactic Acid, Venous: 1.6 mmol/L (ref 0.5–1.9)
Lactic Acid, Venous: 2 mmol/L (ref 0.5–1.9)
Lactic Acid, Venous: 1.1 mmol/L (ref 0.5–1.9)

## 2020-08-22 LAB — CORTISOL-AM, BLOOD: Cortisol - AM: 11.5 ug/dL (ref 6.7–22.6)

## 2020-08-22 LAB — COMPREHENSIVE METABOLIC PANEL
ALT: 23 U/L (ref 0–44)
AST: 25 U/L (ref 15–41)
Albumin: 3.4 g/dL — ABNORMAL LOW (ref 3.5–5.0)
Alkaline Phosphatase: 50 U/L (ref 38–126)
Anion gap: 7 (ref 5–15)
BUN: 17 mg/dL (ref 6–20)
CO2: 22 mmol/L (ref 22–32)
Calcium: 8.1 mg/dL — ABNORMAL LOW (ref 8.9–10.3)
Chloride: 107 mmol/L (ref 98–111)
Creatinine, Ser: 1.22 mg/dL (ref 0.61–1.24)
GFR, Estimated: >60 mL/min (ref >60)
Glucose, Bld: 112 mg/dL — ABNORMAL HIGH (ref 70–99)
Potassium: 4.1 mmol/L (ref 3.5–5.1)
Sodium: 136 mmol/L (ref 135–145)
Total Bilirubin: 1.4 mg/dL — ABNORMAL HIGH (ref 0.3–1.2)
Total Protein: 6.7 g/dL (ref 6.5–8.1)

## 2020-08-22 LAB — PROTIME-INR
INR: 1.3 — ABNORMAL HIGH (ref 0.8–1.2)
Prothrombin Time: 15.3 seconds — ABNORMAL HIGH (ref 11.4–15.2)

## 2020-08-22 LAB — CBC
HCT: 38 % — ABNORMAL LOW (ref 39.0–52.0)
Hemoglobin: 13.2 g/dL (ref 13.0–17.0)
MCH: 29.9 pg (ref 26.0–34.0)
MCHC: 34.7 g/dL (ref 30.0–36.0)
MCV: 86.2 fL (ref 80.0–100.0)
Platelets: 235 10*3/uL (ref 150–400)
RBC: 4.41 MIL/uL (ref 4.22–5.81)
RDW: 12.3 % (ref 11.5–15.5)
WBC: 25.9 10*3/uL — ABNORMAL HIGH (ref 4.0–10.5)
nRBC: 0 % (ref 0.0–0.2)

## 2020-08-22 LAB — HIV ANTIBODY (ROUTINE TESTING W REFLEX): HIV Screen 4th Generation wRfx: NONREACTIVE

## 2020-08-22 LAB — PROCALCITONIN: Procalcitonin: 2.55 ng/mL

## 2020-08-22 MED ORDER — LACTATED RINGERS IV BOLUS
500.0000 mL | Freq: Once | INTRAVENOUS | Status: AC
  Administered 2020-08-22: 500 mL via INTRAVENOUS

## 2020-08-22 MED ORDER — ZOLPIDEM TARTRATE 5 MG PO TABS
5.0000 mg | ORAL_TABLET | Freq: Every evening | ORAL | Status: DC | PRN
  Administered 2020-08-22: 5 mg via ORAL
  Filled 2020-08-22: qty 1

## 2020-08-22 MED ORDER — IPRATROPIUM-ALBUTEROL 0.5-2.5 (3) MG/3ML IN SOLN: 3.0000 mL | RESPIRATORY_TRACT | Status: DC | PRN

## 2020-08-22 MED ORDER — LACTATED RINGERS IV SOLN: INTRAVENOUS | Status: DC

## 2020-08-22 NOTE — Progress Notes (Signed)
[facility]  - [place] at [facility]   PATIENT NAME: Eric [name]    MR#:  [id number]  DATE OF BIRTH:  Jun 09, 1975  SUBJECTIVE:   Patient came in with chest pain and shortness of his not been feeling well for last two days. Associated with some nausea vomiting. Feels better today. Able to tolerate PO diet. No fever. Continues to have some cough with minimal phlegm REVIEW OF SYSTEMS:   Review of Systems  Constitutional: Positive for malaise/fatigue. Negative for chills, fever and weight loss.  HENT: Negative for ear discharge, ear pain and nosebleeds.   Eyes: Negative for blurred vision, pain and discharge.  Respiratory: Positive for cough and shortness of breath. Negative for sputum production, wheezing and stridor.   Cardiovascular: Negative for chest pain, palpitations, orthopnea and PND.  Gastrointestinal: Negative for abdominal pain, diarrhea, nausea and vomiting.  Genitourinary: Negative for frequency and urgency.  Musculoskeletal: Negative for back pain and joint pain.  Neurological: Negative for sensory change, speech change, focal weakness and weakness.  Psychiatric/Behavioral: Negative for depression and hallucinations. The patient is not nervous/anxious.    Tolerating Diet:yes Tolerating PT:   DRUG ALLERGIES:  No Known Allergies  VITALS:  Blood pressure 117/71, pulse 85, temperature 98.3 F (36.8 C), temperature source Oral, resp. rate 18, height 5\' 2"  (1.575 m), weight 84 kg, SpO2 96 %.  PHYSICAL EXAMINATION:   Physical Exam  GENERAL:  45 y.o.-year-old patient lying in the bed with no acute distress.  LUNGS: Normal breath sounds bilaterally, no wheezing, rales, rhonchi. No use of accessory muscles of respiration.  CARDIOVASCULAR: S1, S2 normal. No murmurs, rubs, or gallops.  ABDOMEN: Soft, nontender, nondistended. Bowel sounds present. No organomegaly or mass.  EXTREMITIES: No cyanosis, clubbing or edema b/l.    NEUROLOGIC: Cranial nerves II  through XII are intact. No focal Motor or sensory deficits b/l.   PSYCHIATRIC:  patient is alert and oriented x 3.  SKIN: No obvious rash, lesion, or ulcer.   LABORATORY PANEL:  CBC Recent Labs  Lab 08/22/20 0025  WBC 25.9*  HGB 13.2  HCT 38.0*  PLT 235    Chemistries  Recent Labs  Lab 08/22/20 0025  NA 136  K 4.1  CL 107  CO2 22  GLUCOSE 112*  BUN 17  CREATININE 1.22  CALCIUM 8.1*  AST 25  ALT 23  ALKPHOS 50  BILITOT 1.4*   Cardiac Enzymes No results for input(s): TROPONINI in the last 168 hours. RADIOLOGY:  DG Chest 2 View  Result Date: 08/21/2020 CLINICAL DATA:  Chest pain and shortness of breath. EXAM: CHEST - 2 VIEW COMPARISON:  10/07/2018 FINDINGS: 1343 hours. Low volume film. The cardiopericardial silhouette is within normal limits for size. Subtle patchy airspace disease identified in the parahilar right mid lung. Left lung clear. No pleural effusion. The visualized bony structures of the thorax show no acute abnormality. Lap band noted over the upper abdomen. IMPRESSION: Subtle patchy airspace disease identified in the parahilar right mid lung, suspicious for pneumonia. Electronically Signed   By: 10/09/2018 M.D.   On: 08/21/2020 14:41   CT Abdomen Pelvis W Contrast  Result Date: 08/21/2020 CLINICAL DATA:  45 year old male with abdominal pain and fever. EXAM: CT ABDOMEN AND PELVIS WITH CONTRAST TECHNIQUE: Multidetector CT imaging of the abdomen and pelvis was performed using the standard protocol following bolus administration of intravenous contrast. CONTRAST:  54 OMNIPAQUE IOHEXOL 300 MG/ML  SOLN COMPARISON:  None. FINDINGS: Lower chest: The visualized lung bases are  clear. No intra-abdominal free air or free fluid. Hepatobiliary: No focal liver abnormality is seen. No gallstones, gallbladder wall thickening, or biliary dilatation. Pancreas: Unremarkable. No pancreatic ductal dilatation or surrounding inflammatory changes. Spleen: Normal in size without focal  abnormality. Adrenals/Urinary Tract: Adrenal glands are unremarkable. Kidneys are normal, without renal calculi, focal lesion, or hydronephrosis. Bladder is unremarkable. Stomach/Bowel: There is a small hiatal hernia. A gastric lap band is noted. There is no bowel obstruction or active inflammation. Appendectomy. Vascular/Lymphatic: The abdominal aorta and IVC unremarkable. No portal venous gas. There is no adenopathy. Reproductive: The prostate and seminal vesicles are grossly unremarkable. No pelvic mass. Other: None Musculoskeletal: No acute or significant osseous findings. IMPRESSION: No acute intra-abdominal or pelvic pathology. Electronically Signed   By: Elgie Collard M.D.   On: 08/21/2020 15:58   ASSESSMENT AND PLAN:  Eric [name] is a 45 y.o. male with medical history significant of anxiety, depression, diabetes, GERD, insomnia, cannabis use who presents with chest pain shortness of breath.  Severe Sepsis due to pneumonia--POA --Patient presented with some chest pain and shortness of breath with some associated nausea, vomiting, diarrhea.  - WBC 19K and evidence of pneumonia on chest x-ray in the right perihilar region. -- Lactic acid 1.6--2.0, Pro calcitonin 2.55 -- continue IV fluids -- CT abdomen pelvis did not show any other explanation for source of infection nor did UA.  Headache but no neck stiffness to indicate meningitis. - Continue with azithromycin and ceftriaxone -  blood cultures negative  AKI - Creatinine elevated to 1.3 from baseline of around 1. - IV fluids per sepsis bundle as above - Avoid nephrotoxic agents - Trend renal function and electrolytes  GERD -Continue home PPI  Anxiety Depression - Continue home venlafaxine and BuSpar  Diabetes -- No longer has diabetes status post lap band surgery  Marihuana Use - Noted  DVT prophylaxis:      Lovenox  Code Status:              Full  Family Communication:      none today. Patient tells me family is  disposition Plan:              Patient is from:                        Home             Anticipated DC to:                   Home             Anticipated DC date:               1 to 2   Level of care: Progressive Cardiac Status is: Inpatient  Remains inpatient appropriate because:Inpatient level of care appropriate due to severity of illness   Dispo: The patient is from: Home              Anticipated d/c is to: Home              Patient currently is not medically stable to d/c.   Difficult to place patient No        TOTAL TIME TAKING CARE OF THIS PATIENT: 30 minutes.  >50% time spent on counselling and coordination of care  Note: This dictation was prepared with Dragon dictation along with smaller phrase technology. Any transcriptional errors that result from this process are unintentional.  Jeannelle Wiens  Allena Katz M.D    [facility]   CC: Primary care physician; Gracelyn Nurse, MDPatient ID: Maudry Mayhew, male   DOB: Jun 05, 1975, 45 y.o.   MRN: [id number]

## 2020-08-22 NOTE — Progress Notes (Signed)
Code Sepsis initiated @ 2218 PM. Elink following.

## 2020-08-23 ENCOUNTER — Encounter: Payer: Self-pay | Admitting: Internal Medicine

## 2020-08-23 DIAGNOSIS — J189 Pneumonia, unspecified organism: Secondary | ICD-10-CM | POA: Diagnosis not present

## 2020-08-23 DIAGNOSIS — A419 Sepsis, unspecified organism: Secondary | ICD-10-CM | POA: Diagnosis not present

## 2020-08-23 LAB — CBC
HCT: 37 % — ABNORMAL LOW (ref 39.0–52.0)
Hemoglobin: 12.9 g/dL — ABNORMAL LOW (ref 13.0–17.0)
MCH: 29.7 pg (ref 26.0–34.0)
MCHC: 34.9 g/dL (ref 30.0–36.0)
MCV: 85.1 fL (ref 80.0–100.0)
Platelets: 248 10*3/uL (ref 150–400)
RBC: 4.35 MIL/uL (ref 4.22–5.81)
RDW: 12.2 % (ref 11.5–15.5)
WBC: 14.3 10*3/uL — ABNORMAL HIGH (ref 4.0–10.5)
nRBC: 0 % (ref 0.0–0.2)

## 2020-08-23 MED ORDER — CEFDINIR 300 MG PO CAPS
300.0000 mg | ORAL_CAPSULE | Freq: Two times a day (BID) | ORAL | Status: DC
  Administered 2020-08-23: 300 mg via ORAL
  Filled 2020-08-23 (×2): qty 1

## 2020-08-23 MED ORDER — AZITHROMYCIN 500 MG PO TABS: ORAL_TABLET | ORAL | 0 refills | Status: DC

## 2020-08-23 MED ORDER — CEFDINIR 300 MG PO CAPS: 300.0000 mg | ORAL_CAPSULE | Freq: Two times a day (BID) | ORAL | 0 refills | Status: AC

## 2020-08-23 MED ORDER — AZITHROMYCIN 250 MG PO TABS
500.0000 mg | ORAL_TABLET | Freq: Every day | ORAL | Status: DC
  Administered 2020-08-23: 500 mg via ORAL
  Filled 2020-08-23: qty 2

## 2020-08-23 MED ORDER — PROAIR RESPICLICK 108 (90 BASE) MCG/ACT IN AEPB: 1.0000 | INHALATION_SPRAY | Freq: Four times a day (QID) | RESPIRATORY_TRACT | 1 refills | Status: AC | PRN

## 2020-08-23 NOTE — Discharge Summary (Signed)
[facility] - [place] at [facility]   PATIENT NAME: Eric MooreDaniel [name]    MR#:  [id number]  DATE OF BIRTH:  09/16/1975  DATE OF ADMISSION:  08/21/2020 ADMITTING PHYSICIAN: Synetta FailAlexander B Melvin, MD  DATE OF DISCHARGE: 08/23/2020  PRIMARY CARE PHYSICIAN: Gracelyn NurseJohnston, John D, MD    ADMISSION DIAGNOSIS:  Sepsis due to pneumonia (HCC) [J18.9, A41.9] Community acquired pneumonia, unspecified laterality [J18.9]  DISCHARGE DIAGNOSIS:  Severe Sepsis due to Pneumonia --POA Community acquired pneumonia  SECONDARY DIAGNOSIS:   Past Medical History:  Diagnosis Date  . Acid reflux   . COVID-19   . Depression   . Overweight(278.02)     HOSPITAL COURSE:   [facility] 44 y.[facility].malewith medical history significant ofanxiety, depression, diabetes, GERD, insomnia, cannabis use who presents with chest pain shortness of breath.  Severe Sepsis due to pneumonia--POA --Patient presented with some chest pain and shortness of breath with some associated nausea, vomiting, diarrhea.  --WBC 19K and evidence of pneumonia on chest x-ray in the right perihilar region. -- Lactic acid 1.6--2.0--1.1 -- Pro calcitonin 2.55 --received  IV fluids -- CT abdomen pelvis did not show any other explanation for source of infection nor did UA. -Continue with azithromycin and ceftriaxone--change to po abxs - blood cultures negative --afebrile WBC 19K--25K--14K  AKI -Creatinine elevated to 1.3 from baseline of around 1. -Received IV fluids per sepsis bundle as above -Avoid nephrotoxic agents - Trend renal function and electrolytes --AKI resolved   GERD -Continue home PPI  Anxiety Depression -Continue home venlafaxine and BuSpar  Diabetes --No longer has diabetes status post lap band surgery  Marijuana Use - Noted  DVT prophylaxis:Lovenox Code Status:Full Family Communication: none today. Patient tells me family is disposition  Plan: Patient is from:Home Anticipated DC NW:GNFAto:Home Anticipated DC date:today  Level of care: Progressive Cardiac Status is: Inpatient   Dispo: The patient is from: Home  Anticipated d/c is to: Home  Patient currently is  medically stable to d/c. Pt is excited to go home!              Difficult to place patient No CONSULTS OBTAINED:    DRUG ALLERGIES:  No Known Allergies  DISCHARGE MEDICATIONS:   Allergies as of 08/23/2020   No Known Allergies     Medication List    TAKE these medications   azithromycin 500 MG tablet Commonly known as: ZITHROMAX Take one tab daily x 3 days   busPIRone 5 MG tablet Commonly known as: BUSPAR Take 5 mg by mouth 2 (two) times daily.   cefdinir 300 MG capsule Commonly known as: OMNICEF Take 1 capsule (300 mg total) by mouth every 12 (twelve) hours for 3 days.   pantoprazole 40 MG tablet Commonly known as: PROTONIX Take 1 tablet (40 mg total) by mouth at bedtime.   ProAir RespiClick 108 (90 Base) MCG/ACT Aepb Generic drug: Albuterol Sulfate Inhale 1 puff into the lungs every 6 (six) hours as needed.   venlafaxine XR 75 MG 24 hr capsule Commonly known as: EFFEXOR-XR Take 2 capsules (150 mg total) by mouth daily.   zolpidem 10 MG tablet Commonly known as: AMBIEN Take 10 mg by mouth at bedtime as needed for sleep.       If you experience worsening of your admission symptoms, develop shortness of breath, life threatening emergency, suicidal or homicidal thoughts you must seek medical attention immediately by calling 911 or calling your MD immediately  if symptoms less severe.  You Must read complete  instructions/literature along with all the possible adverse reactions/side effects for all the Medicines you take and that have been prescribed to you. Take any new Medicines after you have completely understood and accept  all the possible adverse reactions/side effects.   Please note  You were cared for by a hospitalist during your hospital stay. If you have any questions about your discharge medications or the care you received while you were in the hospital after you are discharged, you can call the unit and asked to speak with the hospitalist on call if the hospitalist that took care of you is not available. Once you are discharged, your primary care physician will handle any further medical issues. Please note that NO REFILLS for any discharge medications will be authorized once you are discharged, as it is imperative that you return to your primary care physician (or establish a relationship with a primary care physician if you do not have one) for your aftercare needs so that they can reassess your need for medications and monitor your lab values. Today   SUBJECTIVE    Feels a lot better VITAL SIGNS:  Blood pressure 115/73, pulse 76, temperature (!) 97.5 F (36.4 C), temperature source Oral, resp. rate 18, height 5\' 2"  (1.575 m), weight 83.1 kg, SpO2 98 %.  I/O:    Intake/Output Summary (Last 24 hours) at 08/23/2020 1137 Last data filed at 08/23/2020 0900 Gross per 24 hour  Intake 3187.38 ml  Output 500 ml  Net 2687.38 ml    PHYSICAL EXAMINATION:  GENERAL:  45 y.o.-year-old patient lying in the bed with no acute distress.  LUNGS: Normal breath sounds bilaterally, no wheezing, rales,rhonchi or crepitation. No use of accessory muscles of respiration.  CARDIOVASCULAR: S1, S2 normal. No murmurs, rubs, or gallops.  ABDOMEN: Soft, non-tender, non-distended. Bowel sounds present. No organomegaly or mass.  EXTREMITIES: No pedal edema, cyanosis, or clubbing.  NEUROLOGIC: Cranial nerves II through XII are intact. Muscle strength 5/5 in all extremities. Sensation intact. Gait not checked.  PSYCHIATRIC: The patient is alert and oriented x 3.  SKIN: No obvious rash, lesion, or ulcer.   DATA REVIEW:   CBC   Recent Labs  Lab 08/23/20 0411  WBC 14.3*  HGB 12.9*  HCT 37.0*  PLT 248    Chemistries  Recent Labs  Lab 08/22/20 0025  NA 136  K 4.1  CL 107  CO2 22  GLUCOSE 112*  BUN 17  CREATININE 1.22  CALCIUM 8.1*  AST 25  ALT 23  ALKPHOS 50  BILITOT 1.4*    Microbiology Results   Recent Results (from the past 240 hour(s))  Resp Panel by RT-PCR (Flu A&B, Covid) Nasopharyngeal Swab     Status: None   Collection Time: 08/21/20  1:39 PM   Specimen: Nasopharyngeal Swab; Nasopharyngeal(NP) swabs in vial transport medium  Result Value Ref Range Status   SARS Coronavirus 2 by RT PCR NEGATIVE NEGATIVE Final    Comment: (NOTE) SARS-CoV-2 target nucleic acids are NOT DETECTED.  The SARS-CoV-2 RNA is generally detectable in upper respiratory specimens during the acute phase of infection. The lowest concentration of SARS-CoV-2 viral copies this assay can detect is 138 copies/mL. A negative result does not preclude SARS-Cov-2 infection and should not be used as the sole basis for treatment or other patient management decisions. A negative result may occur with  improper specimen collection/handling, submission of specimen other than nasopharyngeal swab, presence of viral mutation(s) within the areas targeted by this assay, and inadequate number  of viral copies(<138 copies/mL). A negative result must be combined with clinical observations, patient history, and epidemiological information. The expected result is Negative.  Fact Sheet for Patients:  BloggerCourse.com  Fact Sheet for Healthcare Providers:  SeriousBroker.it  This test is no t yet approved or cleared by the [place] FDA and  has been authorized for detection and/or diagnosis of SARS-CoV-2 by FDA under an Emergency Use Authorization (EUA). This EUA will remain  in effect (meaning this test can be used) for the duration of the COVID-19 declaration under Section  564(b)(1) of the Act, 21 U.S.C.section 360bbb-3(b)(1), unless the authorization is terminated  or revoked sooner.       Influenza A by PCR NEGATIVE NEGATIVE Final   Influenza B by PCR NEGATIVE NEGATIVE Final    Comment: (NOTE) The Xpert Xpress SARS-CoV-2/FLU/RSV plus assay is intended as an aid in the diagnosis of influenza from Nasopharyngeal swab specimens and should not be used as a sole basis for treatment. Nasal washings and aspirates are unacceptable for Xpert Xpress SARS-CoV-2/FLU/RSV testing.  Fact Sheet for Patients: BloggerCourse.com  Fact Sheet for Healthcare Providers: SeriousBroker.it  This test is not yet approved or cleared by the [place] FDA and has been authorized for detection and/or diagnosis of SARS-CoV-2 by FDA under an Emergency Use Authorization (EUA). This EUA will remain in effect (meaning this test can be used) for the duration of the COVID-19 declaration under Section 564(b)(1) of the Act, 21 U.S.C. section 360bbb-3(b)(1), unless the authorization is terminated or revoked.  Performed at [facility], [address] [place], [place] [address]   CULTURE, BLOOD (ROUTINE X 2) w Reflex to ID Panel     Status: None (Preliminary result)   Collection Time: 08/21/20 10:41 PM   Specimen: BLOOD  Result Value Ref Range Status   Specimen Description BLOOD  RIGHT FOREARM  Final   Special Requests   Final    BOTTLES DRAWN AEROBIC AND ANAEROBIC Blood Culture adequate volume   Culture  Setup Time ANAEROBIC BOTTLE ONLY  Final   Culture   Final    NO GROWTH 2 DAYS Performed at [facility], [address], [place], [place] [address]    Report Status PENDING  Incomplete  CULTURE, BLOOD (ROUTINE X 2) w Reflex to ID Panel     Status: None (Preliminary result)   Collection Time: 08/21/20 11:57 PM   Specimen: BLOOD  Result Value Ref Range Status   Specimen Description BLOOD LEFT ac  Final    Special Requests   Final    BOTTLES DRAWN AEROBIC AND ANAEROBIC Blood Culture adequate volume   Culture   Final    NO GROWTH 1 DAY Performed at [facility], [address], [place], [place] [address]    Report Status PENDING  Incomplete    RADIOLOGY:  DG Chest 2 View  Result Date: 08/21/2020 CLINICAL DATA:  Chest pain and shortness of breath. EXAM: CHEST - 2 VIEW COMPARISON:  10/07/2018 FINDINGS: 1343 hours. Low volume film. The cardiopericardial silhouette is within normal limits for size. Subtle patchy airspace disease identified in the parahilar right mid lung. Left lung clear. No pleural effusion. The visualized bony structures of the thorax show no acute abnormality. Lap band noted over the upper abdomen. IMPRESSION: Subtle patchy airspace disease identified in the parahilar right mid lung, suspicious for pneumonia. Electronically Signed   By: Kennith Center M.D.   On: 08/21/2020 14:41   CT Abdomen Pelvis W Contrast  Result  Date: 08/21/2020 CLINICAL DATA:  45 year old male with abdominal pain and fever. EXAM: CT ABDOMEN AND PELVIS WITH CONTRAST TECHNIQUE: Multidetector CT imaging of the abdomen and pelvis was performed using the standard protocol following bolus administration of intravenous contrast. CONTRAST:  OMNIPAQUE IOHEXOL 300 MG/ML  SOLN COMPARISON:  None. FINDINGS: Lower chest: The visualized lung bases are clear. No intra-abdominal free air or free fluid. Hepatobiliary: No focal liver abnormality is seen. No gallstones, gallbladder wall thickening, or biliary dilatation. Pancreas: Unremarkable. No pancreatic ductal dilatation or surrounding inflammatory changes. Spleen: Normal in size without focal abnormality. Adrenals/Urinary Tract: Adrenal glands are unremarkable. Kidneys are normal, without renal calculi, focal lesion, or hydronephrosis. Bladder is unremarkable. Stomach/Bowel: There is a small hiatal hernia. A gastric lap band is noted. There is no bowel  obstruction or active inflammation. Appendectomy. Vascular/Lymphatic: The abdominal aorta and IVC unremarkable. No portal venous gas. There is no adenopathy. Reproductive: The prostate and seminal vesicles are grossly unremarkable. No pelvic mass. Other: None Musculoskeletal: No acute or significant osseous findings. IMPRESSION: No acute intra-abdominal or pelvic pathology. Electronically Signed   By: Elgie Collard M.D.   On: 08/21/2020 15:58     CODE STATUS:     Code Status Orders  (From admission, onward)         Start     Ordered   08/21/20 2210  Full code  Continuous        08/21/20 2213        Code Status History    Date Active Date Inactive Code Status Order ID Comments User Context   12/17/2012 1758 12/18/2012 1301 Full Code [id number]  [facility]   Advance Care Planning Activity       TOTAL TIME TAKING CARE OF THIS PATIENT: *35* minutes.    Enedina Finner M.D  [facility]    CC: Primary care physician; Gracelyn Nurse, MD

## 2020-08-27 LAB — CULTURE, BLOOD (ROUTINE X 2)
Culture: NO GROWTH
Special Requests: ADEQUATE

## 2020-08-30 LAB — CULTURE, BLOOD (ROUTINE X 2)
Culture: NO GROWTH
Special Requests: ADEQUATE

## 2021-10-24 IMAGING — CR DG CHEST 2V
1 series · 2 of 2 positions shown · non-contrast
Comparison: 10/07/2018

CLINICAL DATA: Chest pain and shortness of breath.

EXAM:
CHEST - 2 VIEW

[Series 1: w chest pa · 0.14mm/px · 2 of 2 slices shown]
[im 1/2]
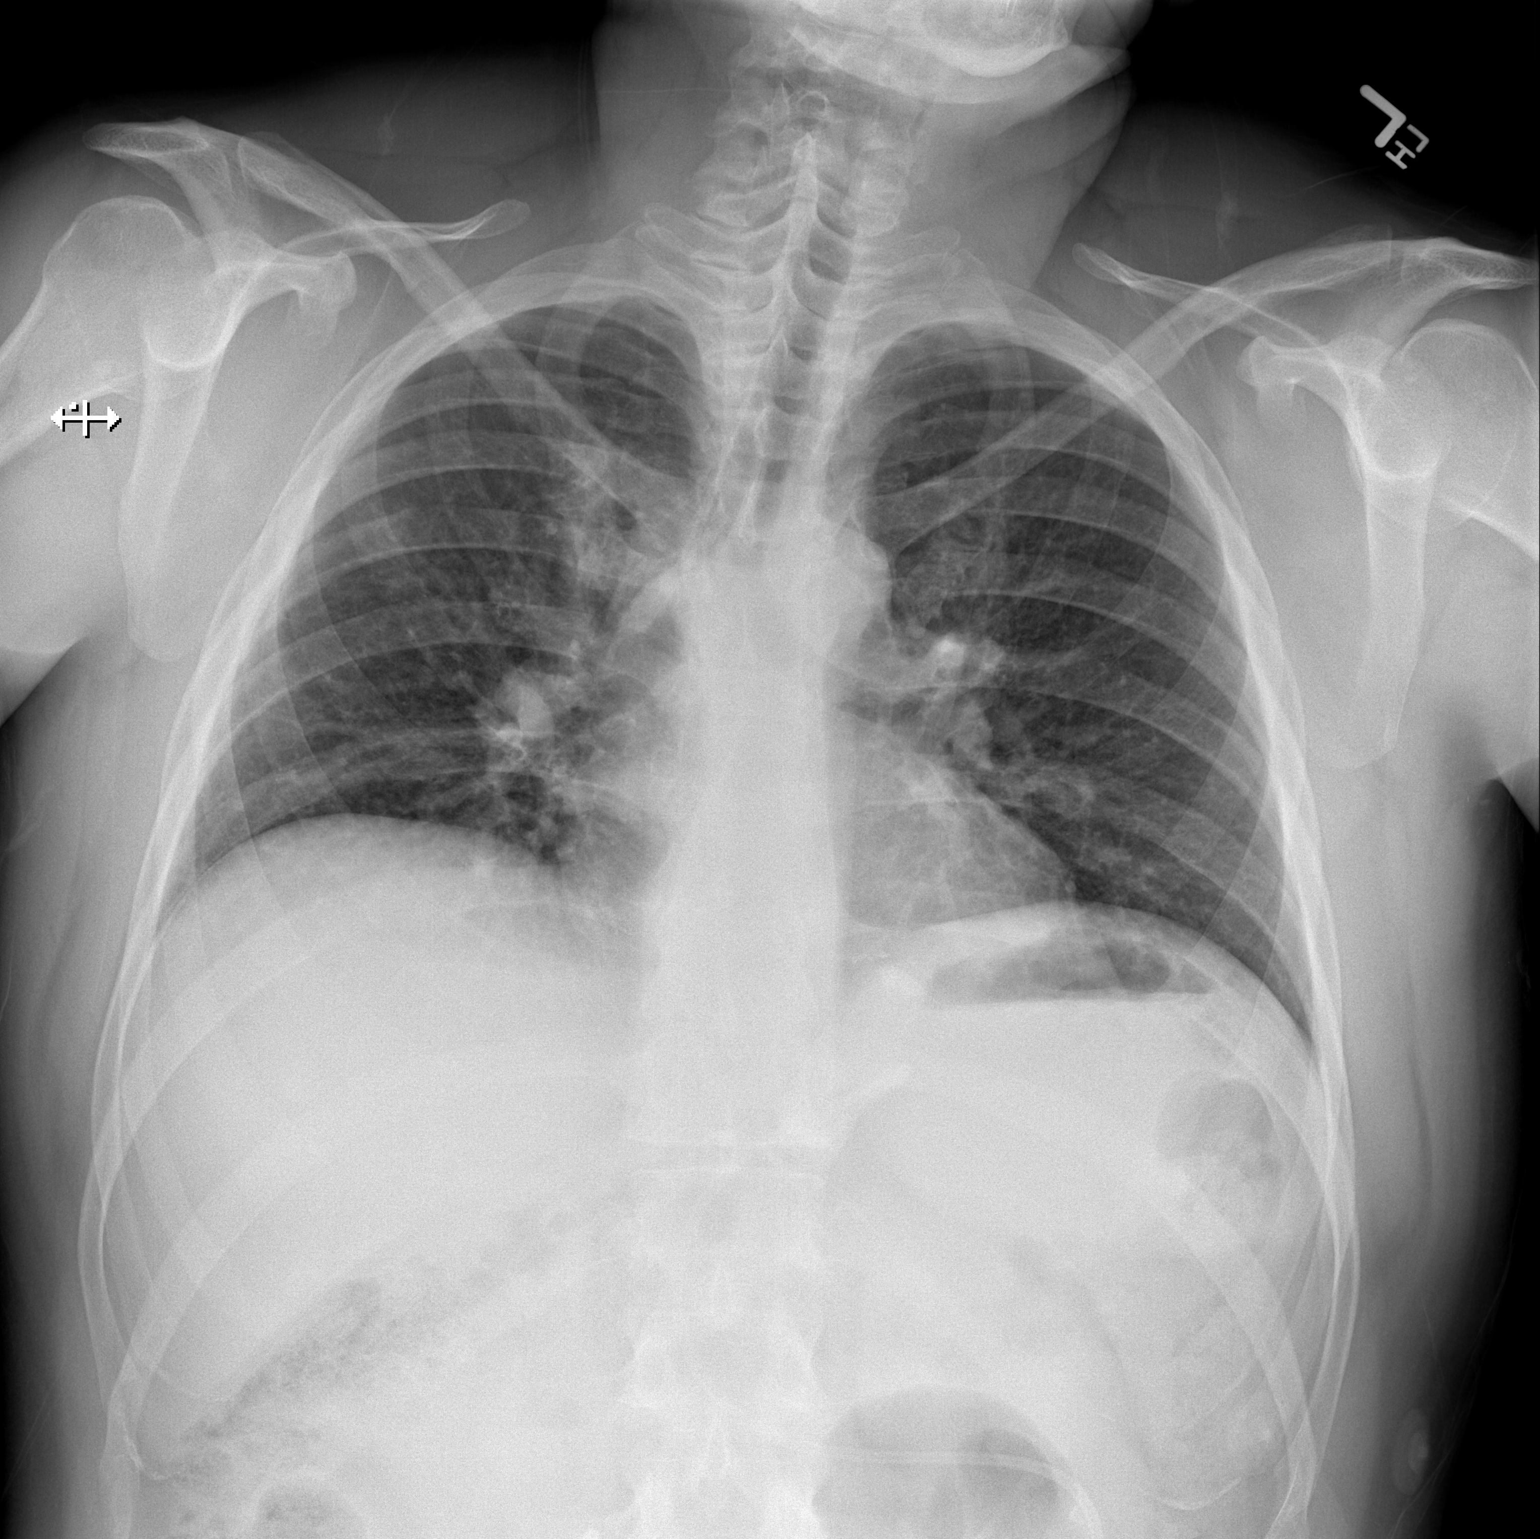
[im 2/2]
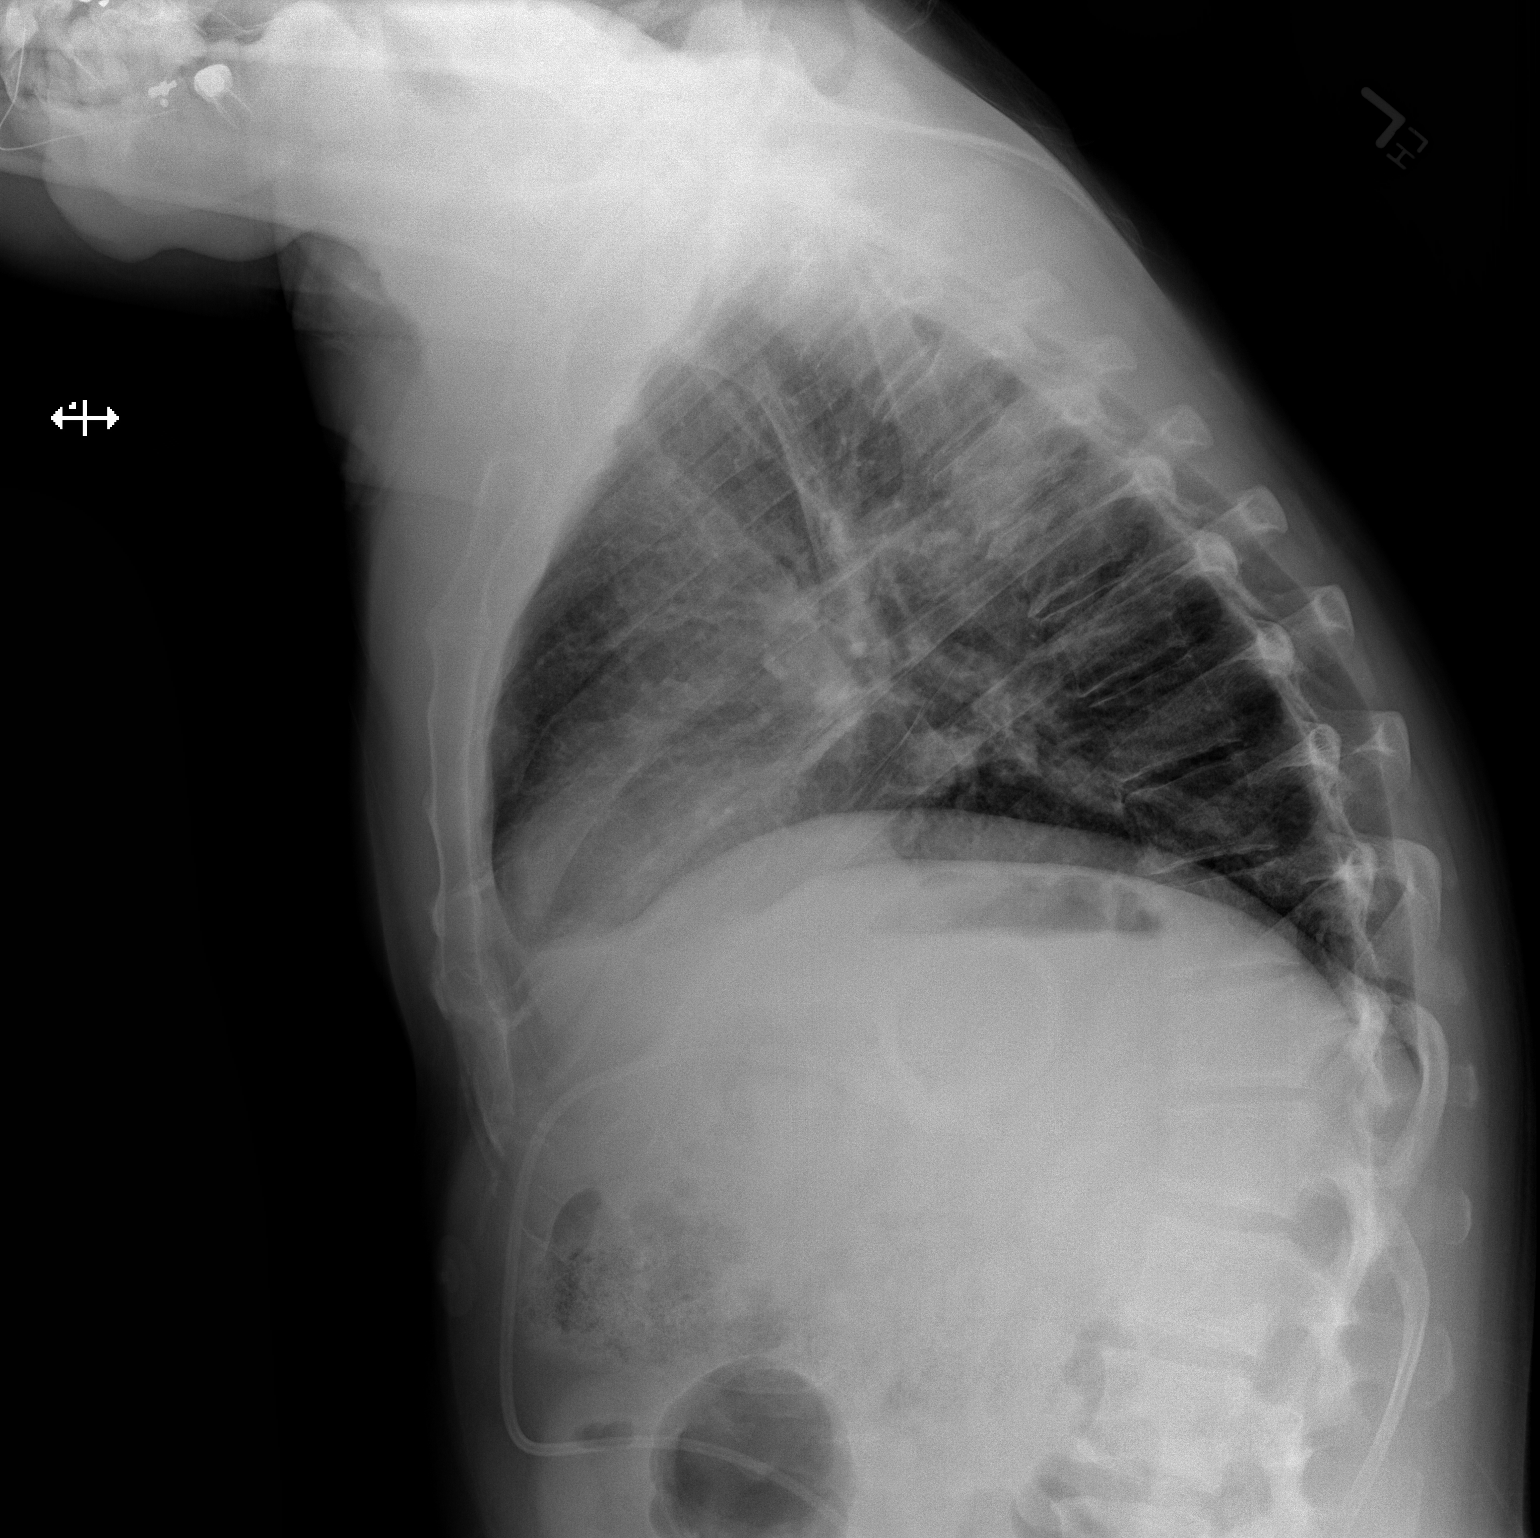

[2 of 2 positions shown; findings below may reference images not displayed]

FINDINGS: 2222 hours. Low volume film. The cardiopericardial silhouette is
within normal limits for size. Subtle patchy airspace disease
identified in the parahilar right mid lung. Left lung clear. No
pleural effusion. The visualized bony structures of the thorax show
no acute abnormality. Lap band noted over the upper abdomen.
IMPRESSION: Subtle patchy airspace disease identified in the parahilar right mid
lung, suspicious for pneumonia.

## 2021-11-03 ENCOUNTER — Encounter (HOSPITAL_COMMUNITY): Payer: Self-pay | Admitting: Emergency Medicine

## 2021-11-03 ENCOUNTER — Ambulatory Visit (HOSPITAL_COMMUNITY)
Admission: EM | Admit: 2021-11-03 | Discharge: 2021-11-03 | Disposition: A | Discharge status: Home / Self Care | Payer: BC Managed Care – PPO | Attending: Family Medicine | Admitting: Family Medicine

## 2021-11-03 DIAGNOSIS — H60391 Other infective otitis externa, right ear: Secondary | ICD-10-CM

## 2021-11-03 MED ORDER — AMOXICILLIN 875 MG PO TABS: 875.0000 mg | ORAL_TABLET | Freq: Two times a day (BID) | ORAL | 0 refills | Status: AC

## 2021-11-03 MED ORDER — NEOMYCIN-POLYMYXIN-HC 3.5-10000-1 OT SOLN: 4.0000 [drp] | Freq: Four times a day (QID) | OTIC | 0 refills | Status: AC

## 2021-11-16 ENCOUNTER — Inpatient Hospital Stay (HOSPITAL_COMMUNITY)
Admission: EM | Admit: 2021-11-16 | Discharge: 2021-11-19 | DRG: 684 | Disposition: A | Discharge status: Home / Self Care | Payer: Self-pay | Attending: Internal Medicine | Admitting: Internal Medicine

## 2021-11-16 ENCOUNTER — Encounter (HOSPITAL_COMMUNITY): Payer: Self-pay

## 2021-11-16 ENCOUNTER — Ambulatory Visit (HOSPITAL_COMMUNITY)
Admission: EM | Admit: 2021-11-16 | Discharge: 2021-11-16 | Disposition: A | Discharge status: Home / Self Care | Payer: Self-pay

## 2021-11-16 ENCOUNTER — Other Ambulatory Visit: Payer: Self-pay

## 2021-11-16 DIAGNOSIS — N179 Acute kidney failure, unspecified: Secondary | ICD-10-CM | POA: Insufficient documentation

## 2021-11-16 DIAGNOSIS — R112 Nausea with vomiting, unspecified: Secondary | ICD-10-CM | POA: Diagnosis present

## 2021-11-16 DIAGNOSIS — K219 Gastro-esophageal reflux disease without esophagitis: Secondary | ICD-10-CM | POA: Diagnosis present

## 2021-11-16 DIAGNOSIS — R7989 Other specified abnormal findings of blood chemistry: Secondary | ICD-10-CM | POA: Insufficient documentation

## 2021-11-16 DIAGNOSIS — Z8616 Personal history of COVID-19: Secondary | ICD-10-CM

## 2021-11-16 DIAGNOSIS — Z8639 Personal history of other endocrine, nutritional and metabolic disease: Secondary | ICD-10-CM

## 2021-11-16 DIAGNOSIS — F32A Depression, unspecified: Secondary | ICD-10-CM | POA: Diagnosis present

## 2021-11-16 DIAGNOSIS — Z79899 Other long term (current) drug therapy: Secondary | ICD-10-CM

## 2021-11-16 DIAGNOSIS — Z6828 Body mass index (BMI) 28.0-28.9, adult: Secondary | ICD-10-CM

## 2021-11-16 DIAGNOSIS — Z9884 Bariatric surgery status: Secondary | ICD-10-CM

## 2021-11-16 DIAGNOSIS — Z720 Tobacco use: Secondary | ICD-10-CM

## 2021-11-16 DIAGNOSIS — B379 Candidiasis, unspecified: Secondary | ICD-10-CM | POA: Diagnosis present

## 2021-11-16 DIAGNOSIS — B370 Candidal stomatitis: Secondary | ICD-10-CM

## 2021-11-16 DIAGNOSIS — E119 Type 2 diabetes mellitus without complications: Secondary | ICD-10-CM | POA: Diagnosis present

## 2021-11-16 LAB — CBC WITH DIFFERENTIAL/PLATELET
Abs Immature Granulocytes: 0.03 10*3/uL (ref 0.00–0.07)
Abs Immature Granulocytes: 0.03 10*3/uL (ref 0.00–0.07)
Basophils Absolute: 0 10*3/uL (ref 0.0–0.1)
Basophils Absolute: 0 10*3/uL (ref 0.0–0.1)
Basophils Relative: 0 %
Basophils Relative: 0 %
Eosinophils Absolute: 0 10*3/uL (ref 0.0–0.5)
Eosinophils Absolute: 0.1 10*3/uL (ref 0.0–0.5)
Eosinophils Relative: 1 %
Eosinophils Relative: 1 %
HCT: 39.4 % (ref 39.0–52.0)
HCT: 40.7 % (ref 39.0–52.0)
Hemoglobin: 13.8 g/dL (ref 13.0–17.0)
Hemoglobin: 13.8 g/dL (ref 13.0–17.0)
Immature Granulocytes: 0 %
Immature Granulocytes: 0 %
Lymphocytes Relative: 12 %
Lymphocytes Relative: 15 %
Lymphs Abs: 1 10*3/uL (ref 0.7–4.0)
Lymphs Abs: 1.1 10*3/uL (ref 0.7–4.0)
MCH: 29.7 pg (ref 26.0–34.0)
MCH: 29.8 pg (ref 26.0–34.0)
MCHC: 35 g/dL (ref 30.0–36.0)
MCHC: 33.9 g/dL (ref 30.0–36.0)
MCV: 84.7 fL (ref 80.0–100.0)
MCV: 87.9 fL (ref 80.0–100.0)
Monocytes Absolute: 0.5 10*3/uL (ref 0.1–1.0)
Monocytes Absolute: 0.5 10*3/uL (ref 0.1–1.0)
Monocytes Relative: 6 %
Monocytes Relative: 7 %
Neutro Abs: 6.8 10*3/uL (ref 1.7–7.7)
Neutro Abs: 5.6 10*3/uL (ref 1.7–7.7)
Neutrophils Relative %: 81 %
Neutrophils Relative %: 77 %
Platelets: 336 10*3/uL (ref 150–400)
Platelets: 324 10*3/uL (ref 150–400)
RBC: 4.65 MIL/uL (ref 4.22–5.81)
RBC: 4.63 MIL/uL (ref 4.22–5.81)
RDW: 13.2 % (ref 11.5–15.5)
RDW: 13.2 % (ref 11.5–15.5)
WBC: 8.5 10*3/uL (ref 4.0–10.5)
WBC: 7.2 10*3/uL (ref 4.0–10.5)
nRBC: 0 % (ref 0.0–0.2)
nRBC: 0 % (ref 0.0–0.2)

## 2021-11-16 LAB — COMPREHENSIVE METABOLIC PANEL
ALT: 17 U/L (ref 0–44)
ALT: 17 U/L (ref 0–44)
AST: 26 U/L (ref 15–41)
AST: 25 U/L (ref 15–41)
Albumin: 4.4 g/dL (ref 3.5–5.0)
Albumin: 4.5 g/dL (ref 3.5–5.0)
Alkaline Phosphatase: 58 U/L (ref 38–126)
Alkaline Phosphatase: 57 U/L (ref 38–126)
Anion gap: 16 — ABNORMAL HIGH (ref 5–15)
Anion gap: 12 (ref 5–15)
BUN: 32 mg/dL — ABNORMAL HIGH (ref 6–20)
BUN: 31 mg/dL — ABNORMAL HIGH (ref 6–20)
CO2: 19 mmol/L — ABNORMAL LOW (ref 22–32)
CO2: 20 mmol/L — ABNORMAL LOW (ref 22–32)
Calcium: 10 mg/dL (ref 8.9–10.3)
Calcium: 9.7 mg/dL (ref 8.9–10.3)
Chloride: 103 mmol/L (ref 98–111)
Chloride: 105 mmol/L (ref 98–111)
Creatinine, Ser: 3.9 mg/dL — ABNORMAL HIGH (ref 0.61–1.24)
Creatinine, Ser: 3.75 mg/dL — ABNORMAL HIGH (ref 0.61–1.24)
GFR, Estimated: 18 mL/min — ABNORMAL LOW (ref >60)
GFR, Estimated: 19 mL/min — ABNORMAL LOW (ref >60)
Glucose, Bld: 82 mg/dL (ref 70–99)
Glucose, Bld: 81 mg/dL (ref 70–99)
Potassium: 4.5 mmol/L (ref 3.5–5.1)
Potassium: 4.5 mmol/L (ref 3.5–5.1)
Sodium: 138 mmol/L (ref 135–145)
Sodium: 137 mmol/L (ref 135–145)
Total Bilirubin: 1.7 mg/dL — ABNORMAL HIGH (ref 0.3–1.2)
Total Bilirubin: 1.6 mg/dL — ABNORMAL HIGH (ref 0.3–1.2)
Total Protein: 8.1 g/dL (ref 6.5–8.1)
Total Protein: 7.9 g/dL (ref 6.5–8.1)

## 2021-11-16 LAB — URINALYSIS, ROUTINE W REFLEX MICROSCOPIC
Bilirubin Urine: NEGATIVE
Glucose, UA: NEGATIVE mg/dL
Ketones, ur: 5 mg/dL — AB
Leukocytes,Ua: NEGATIVE
Nitrite: NEGATIVE
Protein, ur: 30 mg/dL — AB
Specific Gravity, Urine: 1.01 (ref 1.005–1.030)
pH: 5 (ref 5.0–8.0)

## 2021-11-16 LAB — LIPASE, BLOOD
Lipase: 28 U/L (ref 11–51)
Lipase: 28 U/L (ref 11–51)

## 2021-11-16 MED ORDER — PANTOPRAZOLE SODIUM 40 MG IV SOLR
40.0000 mg | Freq: Once | INTRAVENOUS | Status: AC
  Administered 2021-11-16: 40 mg via INTRAVENOUS
  Filled 2021-11-16: qty 10

## 2021-11-16 MED ORDER — ONDANSETRON 4 MG PO TBDP
ORAL_TABLET | ORAL | Status: AC
  Filled 2021-11-16: qty 1

## 2021-11-16 MED ORDER — LACTATED RINGERS IV BOLUS
1000.0000 mL | Freq: Once | INTRAVENOUS | Status: AC
  Administered 2021-11-16: 1000 mL via INTRAVENOUS

## 2021-11-16 MED ORDER — ONDANSETRON 4 MG PO TBDP
4.0000 mg | ORAL_TABLET | Freq: Once | ORAL | Status: AC
  Administered 2021-11-16: 4 mg via ORAL

## 2021-11-16 NOTE — Discharge Instructions (Signed)
Please go directly to the emergency room for further evaluation and management as we discussed. 

## 2021-11-16 NOTE — ED Provider Notes (Signed)
[facility]    CSN: [id number] Arrival date & time: 11/16/21  1305      History   Chief Complaint Chief Complaint  Patient presents with   Emesis    HPI Eric [name] is a 46 y.o. male.   Patient presents today with a several day history of nausea and vomiting that is preventing oral intake.  Reports that 11/10/2021 he underwent dental surgery where he had 21 teeth removed.  He had been on amoxicillin prior to this and is continue this medication but has been unable to take it for the past few days due to ongoing nausea and vomiting.  He reports generalized abdominal cramping as well as some loose stools but denies any significant diarrhea, fever, melena, hematochezia, hematemesis. He does have a history of lap band surgery with associated weight loss but denies additional abdominal surgery.  He has not tried any over-the-counter medication for symptom management.  Denies any suspicious food intake or recent travel.  He reports feeling weak and having body cramps due to severity of vomiting.  Abdominal pain is rated 4 on a 0-10 pain scale, generalized abdomen, described as cramping, no aggravating leaving factors identified.    Past Medical History:  Diagnosis Date   Acid reflux    COVID-19    Depression    Overweight(278.02)     Patient Active Problem List   Diagnosis Date Noted   Sepsis due to pneumonia (HCC) 08/21/2020   AKI (acute kidney injury) (HCC) 08/21/2020   Depression    Anxiety and depression 11/02/2010   DM 12/27/2009   GERD 12/21/2008   INSOMNIA 05/19/2008    Past Surgical History:  Procedure Laterality Date   APPENDECTOMY  1988   ESOPHAGOGASTRODUODENOSCOPY  05/26/2004   Dr. Arlyce Dice   LAPAROSCOPIC GASTRIC BANDING  08/2010       Home Medications    Prior to Admission medications   Medication Sig Start Date End Date Taking? Authorizing Provider  amoxicillin (AMOXIL) 500 MG tablet Take 500 mg by mouth 2 (two) times daily.   Yes [provider]  Albuterol Sulfate (PROAIR RESPICLICK) 108 (90 Base) MCG/ACT AEPB Inhale 1 puff into the lungs every 6 (six) hours as needed. 08/23/20   Enedina Finner, MD  busPIRone (BUSPAR) 5 MG tablet Take 5 mg by mouth 2 (two) times daily. Patient not taking: Reported on 11/16/2021 08/15/20   [provider]  pantoprazole (PROTONIX) 40 MG tablet Take 1 tablet (40 mg total) by mouth at bedtime. 10/02/19   Phineas Semen, MD  venlafaxine XR (EFFEXOR-XR) 75 MG 24 hr capsule Take 2 capsules (150 mg total) by mouth daily. 10/02/19   Phineas Semen, MD  zolpidem (AMBIEN) 10 MG tablet Take 10 mg by mouth at bedtime as needed for sleep.    [provider]    Family History Family History  Problem Relation Age of Onset   Heart disease Father    Heart attack Father 52   Diabetes Mellitus II Father    Diabetes Mellitus II Mother    Hepatitis B Mother     Social History Social History   Tobacco Use   Smoking status: Never   Smokeless tobacco: Current    Types: Chew   Tobacco comments:    Pouches-3-4 pouches/day for 6-8 years-in the process of quitting  Substance Use Topics   Alcohol use: Yes    Comment: Twice a year.   Drug use: No    Comment: Caffiene: None  Allergies   Patient has no known allergies.   Review of Systems Review of Systems  Constitutional:  Positive for activity change and fatigue. Negative for appetite change and fever.  Respiratory:  Negative for cough and shortness of breath.   Cardiovascular:  Negative for chest pain.  Gastrointestinal:  Positive for abdominal pain, diarrhea, nausea and vomiting.  Musculoskeletal:  Positive for myalgias. Negative for arthralgias.  Neurological:  Positive for weakness (Generalized). Negative for dizziness, light-headedness and headaches.     Physical Exam Triage Vital Signs ED Triage Vitals  Enc Vitals Group     BP 11/16/21 1316 125/87     Pulse Rate 11/16/21 1316 91     Resp 11/16/21 1316 18      Temp 11/16/21 1316 98.3 F (36.8 C)     Temp Source 11/16/21 1316 Oral     SpO2 11/16/21 1316 98 %     Weight --      Height --      Head Circumference --      Peak Flow --      Pain Score 11/16/21 1317 4     Pain Loc --      Pain Edu? --      Excl. in GC? --    No data found.  Updated Vital Signs BP 125/87 (BP Location: Left Arm)   Pulse 91   Temp 98.3 F (36.8 C) (Oral)   Resp 18   SpO2 98%   Visual Acuity Right Eye Distance:   Left Eye Distance:   Bilateral Distance:    Right Eye Near:   Left Eye Near:    Bilateral Near:     Physical Exam Vitals reviewed.  Constitutional:      General: He is awake.     Appearance: Normal appearance. He is well-developed. He is ill-appearing.     Comments: Very pleasant male curled up in fetal position laying on exam room table in no acute distress  HENT:     Head: Normocephalic and atraumatic.     Mouth/Throat:     Pharynx: Uvula midline. No oropharyngeal exudate or posterior oropharyngeal erythema.  Cardiovascular:     Rate and Rhythm: Normal rate and regular rhythm.     Heart sounds: Normal heart sounds, S1 normal and S2 normal. No murmur heard. Pulmonary:     Effort: Pulmonary effort is normal.     Breath sounds: Normal breath sounds. No stridor. No wheezing, rhonchi or rales.     Comments: Clear to auscultation bilaterally Abdominal:     General: Bowel sounds are normal.     Palpations: Abdomen is soft.     Tenderness: There is generalized abdominal tenderness. There is no right CVA tenderness, left CVA tenderness, guarding or rebound.     Comments: Mild tenderness palpation throughout abdomen.  Palpable mass in right upper quadrant that is patient's Lap-Band port.  No evidence of acute abdomen on physical exam.  Neurological:     Mental Status: He is alert.  Psychiatric:        Behavior: Behavior is cooperative.      UC Treatments / Results  Labs (all labs ordered are listed, but only abnormal results are  displayed) Labs Reviewed  COMPREHENSIVE METABOLIC PANEL - Abnormal; Notable for the following components:      Result Value   CO2 19 (*)    BUN 32 (*)    Creatinine, Ser 3.90 (*)    Total Bilirubin 1.7 (*)    GFR,  Estimated 18 (*)    Anion gap 16 (*)    All other components within normal limits  CBC WITH DIFFERENTIAL/PLATELET  LIPASE, BLOOD    EKG   Radiology No results found.  Procedures Procedures (including critical care time)  Medications Ordered in UC Medications  ondansetron (ZOFRAN-ODT) disintegrating tablet 4 mg (4 mg Oral Given 11/16/21 1321)    Initial Impression / Assessment and Plan / UC Course  I have reviewed the triage vital signs and the nursing notes.  Pertinent labs & imaging results that were available during my care of the patient were reviewed by me and considered in my medical decision making (see chart for details).     Vital signs are reassuring today.  Patient was given Zofran with improvement of nausea but still had difficulty with oral challenge was only able to lightly sip water.  Basic labs were obtained with CMP showing elevated creatinine at 3.90 indicating acute kidney injury since last value 1 year ago was 1.22.  His CBC and lipase were normal.  Discussed that given acute kidney injury he will need to go to the emergency room for further evaluation and management.  He is agreeable to this.  His vital signs were stable at the time of discharge and he will go directly to ER for further evaluation and management.  Final Clinical Impressions(s) / UC Diagnoses   Final diagnoses:  AKI (acute kidney injury) (HCC)  Elevated serum creatinine  Nausea and vomiting, unspecified vomiting type     Discharge Instructions      Please go directly to the emergency room for further evaluation and management as we discussed.     ED Prescriptions   None    PDMP not reviewed this encounter.   Jeani Hawking, PA-C 11/16/21 1502

## 2021-11-16 NOTE — ED Triage Notes (Signed)
Pt c/o emesis, dizziness/lightheaded after having oral surgery 6/30; abx after 21 teeth extracted, poor PO intake, generalized abd pain since Monday. Seen at [facility] for same, sent to ED for AKI

## 2021-11-16 NOTE — ED Triage Notes (Signed)
Pt presents with c/o emesis, light headedness, and loss of appetite after having 21 teeth pulled on Friday. Pt states he was placed on abx after teeth were pulled

## 2021-11-16 NOTE — ED Provider Notes (Signed)
[facility] EMERGENCY DEPARTMENT  Provider Note  CSN: [id number] Arrival date & time: 11/16/21 1506  History Chief Complaint  Patient presents with   Abdominal Pain    AKI    Eric [name] is a 46 y.o. male with prior history of lap band surgery reports he had 21 teeth extracted on 6/30. He was on Abx pre-op and for a few days post op but 3-4 days ago he began to have nausea, vomiting and now dry heaves. He has had poor PO intake in the interim and now has developed diffuse lower abdominal pains. No fevers. Decreased stool and urine output. He is not taking the Abx anymore due to the vomiting. Has also had prior appendectomy. He was seen at UC earlier today and had labs showing an AKI, sent to the ED for evaluation. He is requesting something for reflux.    Home Medications Prior to Admission medications   Medication Sig Start Date End Date Taking? Authorizing Provider  Albuterol Sulfate (PROAIR RESPICLICK) 108 (90 Base) MCG/ACT AEPB Inhale 1 puff into the lungs every 6 (six) hours as needed. 08/23/20   Enedina Finner, MD  amoxicillin (AMOXIL) 500 MG tablet Take 500 mg by mouth 2 (two) times daily.    [provider]  busPIRone (BUSPAR) 5 MG tablet Take 5 mg by mouth 2 (two) times daily. Patient not taking: Reported on 11/16/2021 08/15/20   [provider]  pantoprazole (PROTONIX) 40 MG tablet Take 1 tablet (40 mg total) by mouth at bedtime. 10/02/19   Phineas Semen, MD  venlafaxine XR (EFFEXOR-XR) 75 MG 24 hr capsule Take 2 capsules (150 mg total) by mouth daily. 10/02/19   Phineas Semen, MD  zolpidem (AMBIEN) 10 MG tablet Take 10 mg by mouth at bedtime as needed for sleep.    [provider]     Allergies    Patient has no known allergies.   Review of Systems   Review of Systems Please see HPI for pertinent positives and negatives  Physical Exam BP 114/83   Pulse 85   Temp 98.3 F (36.8 C) (Oral)   Resp 18   SpO2 97%   Physical  Exam Vitals and nursing note reviewed.  Constitutional:      Appearance: Normal appearance.  HENT:     Head: Normocephalic and atraumatic.     Nose: Nose normal.     Mouth/Throat:     Mouth: Mucous membranes are moist.  Eyes:     Extraocular Movements: Extraocular movements intact.     Conjunctiva/sclera: Conjunctivae normal.  Cardiovascular:     Rate and Rhythm: Normal rate.  Pulmonary:     Effort: Pulmonary effort is normal.     Breath sounds: Normal breath sounds.  Abdominal:     General: Abdomen is flat.     Palpations: Abdomen is soft.     Tenderness: There is abdominal tenderness in the right lower quadrant. There is guarding. Positive signs include McBurney's sign. Negative signs include Murphy's sign.  Musculoskeletal:        General: No swelling. Normal range of motion.     Cervical back: Neck supple.  Skin:    General: Skin is warm and dry.  Neurological:     General: No focal deficit present.     Mental Status: He is alert.  Psychiatric:        Mood and Affect: Mood normal.     ED Results / Procedures / Treatments   EKG  None  Procedures Procedures  Medications Ordered in the ED Medications  heparin injection 5,000 Units (has no administration in time range)  lactated ringers infusion (has no administration in time range)  acetaminophen (TYLENOL) tablet 650 mg (has no administration in time range)    Or  acetaminophen (TYLENOL) suppository 650 mg (has no administration in time range)  ondansetron (ZOFRAN) tablet 4 mg (has no administration in time range)    Or  ondansetron (ZOFRAN) injection 4 mg (has no administration in time range)  lactated ringers bolus 1,000 mL (1,000 mLs Intravenous New Bag/Given 11/16/21 2325)  pantoprazole (PROTONIX) injection 40 mg (40 mg Intravenous Given 11/16/21 2322)    Initial Impression and Plan  Patient here with AKI diagnosed from UC, confirmed on labs here. He has tenderness in his lower abdomen but remote appendectomy  as a child. Normal WBC and lipase. Will send for CT to rule out other surgical process, begin IVF and plan admission for hydration   ED Course   Clinical Course as of 11/17/21 0254  Thu Nov 16, 2021  2328 UA without convincing signs of infection.  [CS]  Fri Nov 17, 2021  0108 I personally viewed the images from radiology studies and agree with radiologist interpretation: CT without acute findings. Will admit to unassigned for   [CS]  0148 Spoke with Dr. Julian Reil, Hospitalist, who will evaluate the patient for admission.  [CS]    Clinical Course User Index [CS] Pollyann Savoy, MD     MDM Rules/Calculators/A&P Medical Decision Making Problems Addressed: AKI (acute kidney injury) [facility]): acute illness or injury  Amount and/or Complexity of Data Reviewed Labs: ordered. Decision-making details documented in ED Course. Radiology: ordered and independent interpretation performed. Decision-making details documented in ED Course.  Risk Prescription drug management. Decision regarding hospitalization.    Final Clinical Impression(s) / ED Diagnoses Final diagnoses:  AKI (acute kidney injury) [facility])    Rx / DC Orders ED Discharge Orders     None        Pollyann Savoy, MD 11/17/21 [phone]

## 2021-11-16 NOTE — ED Provider Triage Note (Signed)
Emergency Medicine Provider Triage Evaluation Note  Eric [name] , a 46 y.o. male  was evaluated in triage.  Pt complains of continued vomiting, nausea and abdominal pain since 11/13/2021.  He had a dental extraction for 21 teeth done on 11/10/2021.  Symptoms began a few days later.  He has not been able to keep any food or drink down.  Has been having generalized abdominal pain and generalized fatigue and weakness.  No changes to bowel movements.  Making very little urine.  Review of Systems  Positive: Above Negative: Chest pain, fever  Physical Exam  BP 114/69   Pulse 74   Temp 98.3 F (36.8 C) (Oral)   Resp 18   SpO2 100%  Gen:   Awake, no distress   Resp:  Normal effort  MSK:   Moves extremities without difficulty  Other:  And generally tender without rebound or guarding  Medical Decision Making  Medically screening exam initiated at 3:58 PM.  Appropriate orders placed.  Eric [name] was informed that the remainder of the evaluation will be completed by another provider, this initial triage assessment does not replace that evaluation, and the importance of remaining in the ED until their evaluation is complete.  Evaluated at urgent care found to have an AKI with a creatinine of 3.9.  I have rechecked these labs and will informed charge nurse that patient needs room to begin treatment.   Dietrich Pates, PA-C 11/16/21 1559

## 2021-11-17 ENCOUNTER — Other Ambulatory Visit: Payer: Self-pay

## 2021-11-17 ENCOUNTER — Encounter (HOSPITAL_COMMUNITY): Payer: Self-pay | Admitting: Internal Medicine

## 2021-11-17 ENCOUNTER — Emergency Department (HOSPITAL_COMMUNITY): Payer: Self-pay

## 2021-11-17 DIAGNOSIS — B370 Candidal stomatitis: Secondary | ICD-10-CM

## 2021-11-17 DIAGNOSIS — Z8639 Personal history of other endocrine, nutritional and metabolic disease: Secondary | ICD-10-CM

## 2021-11-17 DIAGNOSIS — R112 Nausea with vomiting, unspecified: Secondary | ICD-10-CM

## 2021-11-17 DIAGNOSIS — N179 Acute kidney failure, unspecified: Secondary | ICD-10-CM

## 2021-11-17 LAB — CBC
HCT: 35.9 % — ABNORMAL LOW (ref 39.0–52.0)
Hemoglobin: 12.6 g/dL — ABNORMAL LOW (ref 13.0–17.0)
MCH: 30.1 pg (ref 26.0–34.0)
MCHC: 35.1 g/dL (ref 30.0–36.0)
MCV: 85.9 fL (ref 80.0–100.0)
Platelets: 305 10*3/uL (ref 150–400)
RBC: 4.18 MIL/uL — ABNORMAL LOW (ref 4.22–5.81)
RDW: 13.2 % (ref 11.5–15.5)
WBC: 6.4 10*3/uL (ref 4.0–10.5)
nRBC: 0 % (ref 0.0–0.2)

## 2021-11-17 LAB — BASIC METABOLIC PANEL
Anion gap: 10 (ref 5–15)
BUN: 29 mg/dL — ABNORMAL HIGH (ref 6–20)
CO2: 21 mmol/L — ABNORMAL LOW (ref 22–32)
Calcium: 9.3 mg/dL (ref 8.9–10.3)
Chloride: 105 mmol/L (ref 98–111)
Creatinine, Ser: 3.14 mg/dL — ABNORMAL HIGH (ref 0.61–1.24)
GFR, Estimated: 24 mL/min — ABNORMAL LOW (ref >60)
Glucose, Bld: 83 mg/dL (ref 70–99)
Potassium: 4.3 mmol/L (ref 3.5–5.1)
Sodium: 136 mmol/L (ref 135–145)

## 2021-11-17 LAB — RAPID URINE DRUG SCREEN, HOSP PERFORMED
Amphetamines: NOT DETECTED
Barbiturates: NOT DETECTED
Benzodiazepines: NOT DETECTED
Cocaine: NOT DETECTED
Opiates: NOT DETECTED
Tetrahydrocannabinol: POSITIVE — AB

## 2021-11-17 LAB — HIV ANTIBODY (ROUTINE TESTING W REFLEX): HIV Screen 4th Generation wRfx: NONREACTIVE

## 2021-11-17 MED ORDER — ONDANSETRON HCL 4 MG/2ML IJ SOLN
4.0000 mg | Freq: Four times a day (QID) | INTRAMUSCULAR | Status: DC | PRN
  Administered 2021-11-17: 4 mg via INTRAVENOUS
  Filled 2021-11-17: qty 2

## 2021-11-17 MED ORDER — ZOLPIDEM TARTRATE 5 MG PO TABS
10.0000 mg | ORAL_TABLET | Freq: Once | ORAL | Status: AC
  Administered 2021-11-17: 10 mg via ORAL
  Filled 2021-11-17: qty 2

## 2021-11-17 MED ORDER — VENLAFAXINE HCL ER 75 MG PO CP24
75.0000 mg | ORAL_CAPSULE | Freq: Once | ORAL | Status: AC
  Administered 2021-11-17: 75 mg via ORAL
  Filled 2021-11-17: qty 1

## 2021-11-17 MED ORDER — ALUM & MAG HYDROXIDE-SIMETH 200-200-20 MG/5ML PO SUSP
30.0000 mL | Freq: Four times a day (QID) | ORAL | Status: DC | PRN
  Administered 2021-11-17 – 2021-11-18 (×2): 30 mL via ORAL
  Filled 2021-11-17 (×2): qty 30

## 2021-11-17 MED ORDER — NYSTATIN 100000 UNIT/ML MT SUSP
5.0000 mL | Freq: Four times a day (QID) | OROMUCOSAL | Status: DC
  Administered 2021-11-17 – 2021-11-19 (×7): 500000 [IU] via OROMUCOSAL
  Filled 2021-11-17 (×9): qty 5

## 2021-11-17 MED ORDER — ACETAMINOPHEN 325 MG PO TABS
650.0000 mg | ORAL_TABLET | Freq: Four times a day (QID) | ORAL | Status: DC | PRN
  Administered 2021-11-17 – 2021-11-18 (×4): 650 mg via ORAL
  Filled 2021-11-17 (×4): qty 2

## 2021-11-17 MED ORDER — ONDANSETRON HCL 4 MG PO TABS
4.0000 mg | ORAL_TABLET | Freq: Four times a day (QID) | ORAL | Status: DC | PRN
  Administered 2021-11-17: 4 mg via ORAL
  Filled 2021-11-17: qty 1

## 2021-11-17 MED ORDER — ACETAMINOPHEN 650 MG RE SUPP: 650.0000 mg | Freq: Four times a day (QID) | RECTAL | Status: DC | PRN

## 2021-11-17 MED ORDER — HEPARIN SODIUM (PORCINE) 5000 UNIT/ML IJ SOLN
5000.0000 [IU] | Freq: Three times a day (TID) | INTRAMUSCULAR | Status: DC
  Administered 2021-11-17 – 2021-11-19 (×7): 5000 [IU] via SUBCUTANEOUS
  Filled 2021-11-17 (×7): qty 1

## 2021-11-17 MED ORDER — LACTATED RINGERS IV SOLN: INTRAVENOUS | Status: DC

## 2021-11-17 NOTE — Hospital Course (Addendum)
Eric [name] is a 46 yo male with PMH depression, GERD, obesity s/p lap-band, DMII who presented after referral from urgent care for abnormal renal function.  He recently had approximately 21 teeth extracted on 11/10/2021 and was on antibiotics pre and post extraction for a total of about 2 weeks.  He endorsed having a foul taste in his mouth recently as well.  He has also been having intractable nausea and vomiting with inability to keep in adequate nutrition.  At urgent care he was found to have elevated creatinine.  On repeat labs in the ER, his creatinine was 3.9 with a normal baseline 1 year ago. He was admitted for initiation of IV fluids and further monitoring.

## 2021-11-17 NOTE — ED Notes (Signed)
Messaged main pharmacy to verify medications-no reply. ED pharmacy is able to verify. Awaiting meds from main pharmacy.

## 2021-11-17 NOTE — Progress Notes (Signed)
Progress Note    Eric [name]   VPX:[id number]  DOB: [birth year]  DOA: 11/16/2021     0 PCP: Eric Nurse, MD  Initial CC: N/V  Hospital Course: Eric [name] is a 46 yo male with PMH depression, GERD, obesity s/p lap-band, DMII who presented after referral from urgent care for abnormal renal function.  He recently had approximately 21 teeth extracted on 11/10/2021 and was on antibiotics pre and post extraction for a total of about 2 weeks.  He endorsed having a foul taste in his mouth recently as well.  He has also been having intractable nausea and vomiting with inability to keep in adequate nutrition.  At urgent care he was found to have elevated creatinine.  On repeat labs in the ER, his creatinine was 3.9 with a normal baseline 1 year ago. He was admitted for initiation of IV fluids and further monitoring.  Interval History:  Seen this morning in the ER.  Nausea and vomiting had improved some.  He was amenable with trialing clear liquids.  He understands plan for ongoing fluids and monitoring renal function  Assessment and Plan: * AKI (acute kidney injury) (HCC) - baseline creatinine ~ 1 - patient presents with increase in creat >0.3 mg/dL above baseline, creat increase >1.5x baseline presumed to have occurred within past 7 days PTA - suspected pre-renal from N/V but at risk for ATN due to prolonged pre-renal state - creat 3.9 on admission; has improved some with IVF - normal appearance of kidneys on CT A/P with no hydro - UA noted with granular casts  Nausea and vomiting - Possibly lingering effect of teeth extraction and may be some contribution from antibiotic use - Low suspicion for underlying infection at this time - CT abdomen/pelvis negative for acute findings - has not been able to keep much down lately and just now on trial of CLD; not able to maintain adequate nutrition yet at this time and will require ongoing IVF - continue zofran PRN  Thrush - Possibly from  antibiotic use and ongoing nausea/vomiting - Trial of nystatin  History of diet-controlled diabetes - diet controlled   Old records reviewed in assessment of this patient  Antimicrobials: Nystatin susp 7/7 >> current  DVT prophylaxis:  heparin injection 5,000 Units Start: 11/17/21 0600   Code Status:   Code Status: Full Code  Mobility Assessment (last 72 hours)     Mobility Assessment   No documentation.           Disposition Plan:  Home in 1-2 days Status is: Obs  Objective: Blood pressure 117/71, pulse 79, temperature 98.3 F (36.8 C), temperature source Oral, resp. rate 17, SpO2 97 %.  Examination:  Physical Exam Constitutional:      General: He is not in acute distress.    Comments: Fatigued appearing  HENT:     Head: Normocephalic and atraumatic.     Mouth/Throat:     Mouth: Mucous membranes are moist.  Eyes:     Extraocular Movements: Extraocular movements intact.  Cardiovascular:     Rate and Rhythm: Normal rate and regular rhythm.     Heart sounds: Normal heart sounds.  Pulmonary:     Effort: Pulmonary effort is normal. No respiratory distress.     Breath sounds: Normal breath sounds. No wheezing.  Abdominal:     General: Bowel sounds are normal. There is no distension.     Palpations: Abdomen is soft.     Tenderness: There is no abdominal  tenderness.  Musculoskeletal:        General: Normal range of motion.     Cervical back: Normal range of motion and neck supple.  Skin:    General: Skin is warm and dry.  Neurological:     General: No focal deficit present.     Mental Status: He is alert.  Psychiatric:        Mood and Affect: Mood normal.        Behavior: Behavior normal.      Consultants:    Procedures:    Data Reviewed: Results for orders placed or performed during the hospital encounter of 11/16/21 (from the past 24 hour(s))  Comprehensive metabolic panel     Status: Abnormal   Collection Time: 11/16/21  4:20 PM  Result  Value Ref Range   Sodium 137 135 - 145 mmol/L   Potassium 4.5 3.5 - 5.1 mmol/L   Chloride 105 98 - 111 mmol/L   CO2 20 (L) 22 - 32 mmol/L   Glucose, Bld 81 70 - 99 mg/dL   BUN 31 (H) 6 - 20 mg/dL   Creatinine, Ser 8.56 (H) 0.61 - 1.24 mg/dL   Calcium 9.7 8.9 - 31.4 mg/dL   Total Protein 7.9 6.5 - 8.1 g/dL   Albumin 4.5 3.5 - 5.0 g/dL   AST 25 15 - 41 U/L   ALT 17 0 - 44 U/L   Alkaline Phosphatase 57 38 - 126 U/L   Total Bilirubin 1.6 (H) 0.3 - 1.2 mg/dL   GFR, Estimated 19 (L) >60 mL/min   Anion gap 12 5 - 15  CBC with Differential     Status: None   Collection Time: 11/16/21  4:20 PM  Result Value Ref Range   WBC 7.2 4.0 - 10.5 K/uL   RBC 4.63 4.22 - 5.81 MIL/uL   Hemoglobin 13.8 13.0 - 17.0 g/dL   HCT 97.0 26.3 - 78.5 %   MCV 87.9 80.0 - 100.0 fL   MCH 29.8 26.0 - 34.0 pg   MCHC 33.9 30.0 - 36.0 g/dL   RDW 88.5 02.7 - 74.1 %   Platelets 324 150 - 400 K/uL   nRBC 0.0 0.0 - 0.2 %   Neutrophils Relative % 77 %   Neutro Abs 5.6 1.7 - 7.7 K/uL   Lymphocytes Relative 15 %   Lymphs Abs 1.1 0.7 - 4.0 K/uL   Monocytes Relative 7 %   Monocytes Absolute 0.5 0.1 - 1.0 K/uL   Eosinophils Relative 1 %   Eosinophils Absolute 0.1 0.0 - 0.5 K/uL   Basophils Relative 0 %   Basophils Absolute 0.0 0.0 - 0.1 K/uL   Immature Granulocytes 0 %   Abs Immature Granulocytes 0.03 0.00 - 0.07 K/uL  Lipase, blood     Status: None   Collection Time: 11/16/21  4:20 PM  Result Value Ref Range   Lipase 28 11 - 51 U/L  Urinalysis, Routine w reflex microscopic Urine, Clean Catch     Status: Abnormal   Collection Time: 11/16/21 11:11 PM  Result Value Ref Range   Color, Urine YELLOW YELLOW   APPearance HAZY (A) CLEAR   Specific Gravity, Urine 1.010 1.005 - 1.030   pH 5.0 5.0 - 8.0   Glucose, UA NEGATIVE NEGATIVE mg/dL   Hgb urine dipstick SMALL (A) NEGATIVE   Bilirubin Urine NEGATIVE NEGATIVE   Ketones, ur 5 (A) NEGATIVE mg/dL   Protein, ur 30 (A) NEGATIVE mg/dL   Nitrite NEGATIVE NEGATIVE  Leukocytes,Ua NEGATIVE NEGATIVE   RBC / HPF 0-5 0 - 5 RBC/hpf   WBC, UA 11-20 0 - 5 WBC/hpf   Bacteria, UA RARE (A) NONE SEEN   Squamous Epithelial / LPF 0-5 0 - 5   Mucus PRESENT    Granular Casts, UA PRESENT    WBC Casts, UA PRESENT   HIV Antibody (routine testing w rflx)     Status: None   Collection Time: 11/17/21  2:10 AM  Result Value Ref Range   HIV Screen 4th Generation wRfx Non Reactive Non Reactive  Culture, blood (Routine X 2) w Reflex to ID Panel     Status: None (Preliminary result)   Collection Time: 11/17/21  2:10 AM   Specimen: BLOOD  Result Value Ref Range   Specimen Description BLOOD LEFT ANTECUBITAL    Special Requests      BOTTLES DRAWN AEROBIC AND ANAEROBIC Blood Culture adequate volume   Culture      NO GROWTH < 12 HOURS Performed at [facility], [address] [place], [place] [address]    Report Status PENDING   Culture, blood (Routine X 2) w Reflex to ID Panel     Status: None (Preliminary result)   Collection Time: 11/17/21  2:15 AM   Specimen: BLOOD  Result Value Ref Range   Specimen Description BLOOD RIGHT ANTECUBITAL    Special Requests      BOTTLES DRAWN AEROBIC AND ANAEROBIC Blood Culture adequate volume   Culture      NO GROWTH < 12 HOURS Performed at [facility], [address], [place], [place] [address]    Report Status PENDING   CBC     Status: Abnormal   Collection Time: 11/17/21  3:33 AM  Result Value Ref Range   WBC 6.4 4.0 - 10.5 K/uL   RBC 4.18 (L) 4.22 - 5.81 MIL/uL   Hemoglobin 12.6 (L) 13.0 - 17.0 g/dL   HCT 76.7 (L) 20.9 - 47.0 %   MCV 85.9 80.0 - 100.0 fL   MCH 30.1 26.0 - 34.0 pg   MCHC 35.1 30.0 - 36.0 g/dL   RDW 96.2 83.6 - 62.9 %   Platelets 305 150 - 400 K/uL   nRBC 0.0 0.0 - 0.2 %  Basic metabolic panel     Status: Abnormal   Collection Time: 11/17/21  3:33 AM  Result Value Ref Range   Sodium 136 135 - 145 mmol/L   Potassium 4.3 3.5 - 5.1 mmol/L   Chloride 105 98 - 111 mmol/L   CO2 21 (L) 22 - 32  mmol/L   Glucose, Bld 83 70 - 99 mg/dL   BUN 29 (H) 6 - 20 mg/dL   Creatinine, Ser 4.76 (H) 0.61 - 1.24 mg/dL   Calcium 9.3 8.9 - 54.6 mg/dL   GFR, Estimated 24 (L) >60 mL/min   Anion gap 10 5 - 15    I have Reviewed nursing notes, Vitals, and Lab results since pt's last encounter. Pertinent lab results : see above I have ordered test including BMP, CBC, Mg I have reviewed the last note from staff over past 24 hours I have discussed pt's care plan and test results with nursing staff, case manager   LOS: 0 days   Lewie Chamber, MD [facility] 11/17/2021, 1:43 PM

## 2021-11-17 NOTE — Assessment & Plan Note (Addendum)
-   Possibly lingering effect of teeth extraction and may be some contribution from antibiotic use - Low suspicion for underlying infection at this time - CT abdomen/pelvis negative for acute findings -Tolerated clear liquids yesterday, advance diet further today to soft diet - continue zofran PRN

## 2021-11-17 NOTE — Assessment & Plan Note (Addendum)
-   baseline creatinine ~ 1 - patient presents with increase in creat >0.3 mg/dL above baseline, creat increase >1.5x baseline presumed to have occurred within past 7 days PTA - suspected pre-renal from N/V but at risk for ATN due to prolonged pre-renal state - creat 3.9 on admission; has improved some with IVF - normal appearance of kidneys on CT A/P with no hydro - UA noted with granular casts -Creatinine improving with fluids, continue 1 more day

## 2021-11-17 NOTE — Assessment & Plan Note (Signed)
-   Possibly from antibiotic use and ongoing nausea/vomiting - treated with nystatin

## 2021-11-17 NOTE — H&P (Signed)
History and Physical    Patient: Eric [name] [id number]:[id number] DOB: 1975-11-13 DOA: 11/16/2021 DOS: the patient was seen and examined on 11/17/2021 PCP: Gracelyn Nurse, MD  Patient coming from: Home  Chief Complaint:  Chief Complaint  Patient presents with   Abdominal Pain    AKI   HPI: Eric [name] is a 46 y.o. male with medical history significant of Prior obesity (s/p lap-band surgery), prior DM2 (in somewhat of a 'remission' status following wt loss from the lap-band surgery).  Pt had 21 teeth extracted on 6/30.  Following surgery was started on ABx prophylactic meds.  Immediately following surgery he began to have N/V, poor PO intake.  This has persisted despite stoppage of ABx.  Went to UC who referred him to ED after they discovered he had new AKI.  He has no history of renal problems other than AKI once before when he was admitted for severe sepsis secondary to PNA.  That appeared to resolve and creat at discharge (April 2022) was 1.2    Review of Systems: As mentioned in the history of present illness. All other systems reviewed and are negative. Past Medical History:  Diagnosis Date   Acid reflux    COVID-19    Depression    Overweight(278.02)    Past Surgical History:  Procedure Laterality Date   APPENDECTOMY  1988   ESOPHAGOGASTRODUODENOSCOPY  05/26/2004   Dr. Arlyce Dice   LAPAROSCOPIC GASTRIC BANDING  08/2010   Social History:  reports that he has never smoked. His smokeless tobacco use includes chew. He reports current alcohol use. He reports that he does not use drugs.  No Known Allergies  Family History  Problem Relation Age of Onset   Heart disease Father    Heart attack Father 27   Diabetes Mellitus II Father    Diabetes Mellitus II Mother    Hepatitis B Mother     Prior to Admission medications   Medication Sig Start Date End Date Taking? Authorizing Provider  Albuterol Sulfate (PROAIR RESPICLICK) 108 (90 Base) MCG/ACT AEPB Inhale 1 puff into  the lungs every 6 (six) hours as needed. 08/23/20   Enedina Finner, MD  amoxicillin (AMOXIL) 500 MG tablet Take 500 mg by mouth 2 (two) times daily.    [provider]  busPIRone (BUSPAR) 5 MG tablet Take 5 mg by mouth 2 (two) times daily. Patient not taking: Reported on 11/16/2021 08/15/20   [provider]  pantoprazole (PROTONIX) 40 MG tablet Take 1 tablet (40 mg total) by mouth at bedtime. 10/02/19   Phineas Semen, MD  venlafaxine XR (EFFEXOR-XR) 75 MG 24 hr capsule Take 2 capsules (150 mg total) by mouth daily. 10/02/19   Phineas Semen, MD  zolpidem (AMBIEN) 10 MG tablet Take 10 mg by mouth at bedtime as needed for sleep.    [provider]    Physical Exam: Vitals:   11/16/21 2345 11/17/21 0000 11/17/21 0015 11/17/21 0030  BP: 132/84 132/84 123/77 114/83  Pulse: 78 73 78 85  Resp: 13 (!) 21 15 18   Temp:      TempSrc:      SpO2: 100% 99% 96% 97%   Constitutional: NAD, calm, comfortable Eyes: PERRL, lids and conjunctivae normal ENMT: Mucous membranes are moist. Posterior pharynx clear of any exudate or lesions.Normal dentition.  Neck: normal, supple, no masses, no thyromegaly Respiratory: clear to auscultation bilaterally, no wheezing, no crackles. Normal respiratory effort. No accessory muscle use.  Cardiovascular: Regular rate and rhythm, no  murmurs / rubs / gallops. No extremity edema. 2+ pedal pulses. No carotid bruits.  Abdomen: RLQ abd TTP, no rebound, does have guarding Musculoskeletal: no clubbing / cyanosis. No joint deformity upper and lower extremities. Good ROM, no contractures. Normal muscle tone.  Skin: no rashes, lesions, ulcers. No induration Neurologic: CN 2-12 grossly intact. Sensation intact, DTR normal. Strength 5/5 in all 4.  Psychiatric: Normal judgment and insight. Alert and oriented x 3. Normal mood.   Data Reviewed:       Latest Ref Rng & Units 11/16/2021    4:20 PM 11/16/2021    1:45 PM 08/23/2020    4:11 AM  CBC  WBC 4.0 - 10.5  K/uL 7.2  8.5  14.3   Hemoglobin 13.0 - 17.0 g/dL 13.8  13.8  12.9   Hematocrit 39.0 - 52.0 % 40.7  39.4  37.0   Platelets 150 - 400 K/uL 324  336  248       Latest Ref Rng & Units 11/16/2021    4:20 PM 11/16/2021    1:45 PM 08/22/2020   12:25 AM  BMP  Glucose 70 - 99 mg/dL 81  82  112   BUN 6 - 20 mg/dL 31  32  17   Creatinine 0.61 - 1.24 mg/dL 3.75  3.90  1.22   Sodium 135 - 145 mmol/L 137  138  136   Potassium 3.5 - 5.1 mmol/L 4.5  4.5  4.1   Chloride 98 - 111 mmol/L 105  103  107   CO2 22 - 32 mmol/L 20  19  22    Calcium 8.9 - 10.3 mg/dL 9.7  10.0  8.1    CT AP = no acute process in abd In report: "The appendix is surgically absent."  Assessment and Plan: * AKI (acute kidney injury) ([place]) Likely pre-renal / ATN in setting of uncontrolled N/V and poor PO intake following teeth extraction on 6/30. No evidence of obstruction on CT AP today IVF: 1L bolus and 125 cc/hr Strict intake and output Repeat BMP in AM UA is back: shows WBC casts, this would be consistent with the expected ATN. Nephro consult if not rapidly improving  Nausea and vomiting ? Side effect of PO ABx he was put on post surgery? Though no longer taking the PO ABx and still having severe nausea in ED. ? Occult infection elsewhere / bacteremia post removal of 21 teeth? CXR neg No SIRS today, WBC nl CT AP nl Despite ABD physical exam findings, I would expect something to show up on CT AP at this late a date (a week after symptom onset) if he had acute appendicitis. Dont really have a reason to put him on empiric ABx at the moment. Will order BCx x2 and see if anything grows. Will put in for BCx to be extended culture to look for HASEK organisms too.  History of diet-controlled diabetes No longer requiring medications following a prior Lap-band surgery.      Advance Care Planning:   Code Status: Full Code  Consults: None  Family Communication: No family in room  Severity of Illness: The appropriate  patient status for this patient is OBSERVATION. Observation status is judged to be reasonable and necessary in order to provide the required intensity of service to ensure the patient's safety. The patient's presenting symptoms, physical exam findings, and initial radiographic and laboratory data in the context of their medical condition is felt to place them at decreased risk for further clinical deterioration.  Furthermore, it is anticipated that the patient will be medically stable for discharge from the hospital within 2 midnights of admission.   Author: Hillary Bow., DO 11/17/2021 1:57 AM  For on call review [email or web address].

## 2021-11-17 NOTE — Assessment & Plan Note (Addendum)
diet controlled.  

## 2021-11-18 LAB — CBC WITH DIFFERENTIAL/PLATELET
Abs Immature Granulocytes: 0.01 10*3/uL (ref 0.00–0.07)
Basophils Absolute: 0 10*3/uL (ref 0.0–0.1)
Basophils Relative: 0 %
Eosinophils Absolute: 0.1 10*3/uL (ref 0.0–0.5)
Eosinophils Relative: 2 %
HCT: 35 % — ABNORMAL LOW (ref 39.0–52.0)
Hemoglobin: 12 g/dL — ABNORMAL LOW (ref 13.0–17.0)
Immature Granulocytes: 0 %
Lymphocytes Relative: 37 %
Lymphs Abs: 1.7 10*3/uL (ref 0.7–4.0)
MCH: 29.7 pg (ref 26.0–34.0)
MCHC: 34.3 g/dL (ref 30.0–36.0)
MCV: 86.6 fL (ref 80.0–100.0)
Monocytes Absolute: 0.5 10*3/uL (ref 0.1–1.0)
Monocytes Relative: 12 %
Neutro Abs: 2.2 10*3/uL (ref 1.7–7.7)
Neutrophils Relative %: 49 %
Platelets: 305 10*3/uL (ref 150–400)
RBC: 4.04 MIL/uL — ABNORMAL LOW (ref 4.22–5.81)
RDW: 12.8 % (ref 11.5–15.5)
WBC: 4.5 10*3/uL (ref 4.0–10.5)
nRBC: 0 % (ref 0.0–0.2)

## 2021-11-18 LAB — BASIC METABOLIC PANEL
Anion gap: 7 (ref 5–15)
BUN: 26 mg/dL — ABNORMAL HIGH (ref 6–20)
CO2: 22 mmol/L (ref 22–32)
Calcium: 9.1 mg/dL (ref 8.9–10.3)
Chloride: 108 mmol/L (ref 98–111)
Creatinine, Ser: 2.06 mg/dL — ABNORMAL HIGH (ref 0.61–1.24)
GFR, Estimated: 39 mL/min — ABNORMAL LOW (ref >60)
Glucose, Bld: 89 mg/dL (ref 70–99)
Potassium: 4.2 mmol/L (ref 3.5–5.1)
Sodium: 137 mmol/L (ref 135–145)

## 2021-11-18 LAB — MAGNESIUM: Magnesium: 1.9 mg/dL (ref 1.7–2.4)

## 2021-11-18 MED ORDER — ZOLPIDEM TARTRATE 5 MG PO TABS
10.0000 mg | ORAL_TABLET | Freq: Every evening | ORAL | Status: DC | PRN
  Administered 2021-11-18: 10 mg via ORAL
  Filled 2021-11-18: qty 2

## 2021-11-18 MED ORDER — PANTOPRAZOLE SODIUM 40 MG PO TBEC
40.0000 mg | DELAYED_RELEASE_TABLET | Freq: Every day | ORAL | Status: DC
  Administered 2021-11-18 – 2021-11-19 (×2): 40 mg via ORAL
  Filled 2021-11-18 (×2): qty 1

## 2021-11-18 NOTE — Progress Notes (Signed)
Progress Note    Eric [name]   ZOX:[id number]  DOB: 1976/02/13  DOA: 11/16/2021     1 PCP: Gracelyn Nurse, MD  Initial CC: N/V  Hospital Course: Eric [name] is a 46 yo male with PMH depression, GERD, obesity s/p lap-band, DMII who presented after referral from urgent care for abnormal renal function.  He recently had approximately 21 teeth extracted on 11/10/2021 and was on antibiotics pre and post extraction for a total of about 2 weeks.  He endorsed having a foul taste in his mouth recently as well.  He has also been having intractable nausea and vomiting with inability to keep in adequate nutrition.  At urgent care he was found to have elevated creatinine.  On repeat labs in the ER, his creatinine was 3.9 with a normal baseline 1 year ago. He was admitted for initiation of IV fluids and further monitoring.  Interval History:  No events overnight.  Tolerated clear liquids yesterday and is okay with further advancing diet today.  Assessment and Plan: * AKI (acute kidney injury) ([facility]) - baseline creatinine ~ 1 - patient presents with increase in creat >0.3 mg/dL above baseline, creat increase >1.5x baseline presumed to have occurred within past 7 days PTA - suspected pre-renal from N/V but at risk for ATN due to prolonged pre-renal state - creat 3.9 on admission; has improved some with IVF - normal appearance of kidneys on CT A/P with no hydro - UA noted with granular casts -Creatinine improving with fluids, continue 1 more day  Nausea and vomiting-resolved as of 11/18/2021 - Possibly lingering effect of teeth extraction and may be some contribution from antibiotic use - Low suspicion for underlying infection at this time - CT abdomen/pelvis negative for acute findings -Tolerated clear liquids yesterday, advance diet further today to soft diet - continue zofran PRN  Thrush - Possibly from antibiotic use and ongoing nausea/vomiting - Trial of nystatin  History of diet-controlled  diabetes - diet controlled   Old records reviewed in assessment of this patient  Antimicrobials: Nystatin susp 7/7 >> current  DVT prophylaxis:  heparin injection 5,000 Units Start: 11/17/21 0600   Code Status:   Code Status: Full Code  Mobility Assessment (last 72 hours)     Mobility Assessment     Row Name 11/17/21 1940 11/17/21 1800         Does patient have an order for bedrest or is patient medically unstable No - Continue assessment No - Continue assessment      What is the highest level of mobility based on the progressive mobility assessment? Level 6 (Walks independently in room and hall) - Balance while walking in room without assist - Complete Level 6 (Walks independently in room and hall) - Balance while walking in room without assist - Complete               Disposition Plan:  Home Sunday Status is: Inpt  Objective: Blood pressure 126/82, pulse 75, temperature 98 F (36.7 C), temperature source Oral, resp. rate 16, height 5' 5.04" (1.652 m), weight 78.9 kg, SpO2 100 %.  Examination:  Physical Exam Constitutional:      General: He is not in acute distress.    Comments: Less fatigued appearing  HENT:     Head: Normocephalic and atraumatic.     Mouth/Throat:     Mouth: Mucous membranes are moist.  Eyes:     Extraocular Movements: Extraocular movements intact.  Cardiovascular:     Rate  and Rhythm: Normal rate and regular rhythm.     Heart sounds: Normal heart sounds.  Pulmonary:     Effort: Pulmonary effort is normal. No respiratory distress.     Breath sounds: Normal breath sounds. No wheezing.  Abdominal:     General: Bowel sounds are normal. There is no distension.     Palpations: Abdomen is soft.     Tenderness: There is no abdominal tenderness.  Musculoskeletal:        General: Normal range of motion.     Cervical back: Normal range of motion and neck supple.  Skin:    General: Skin is warm and dry.  Neurological:     General: No focal  deficit present.     Mental Status: He is alert.  Psychiatric:        Mood and Affect: Mood normal.        Behavior: Behavior normal.      Consultants:    Procedures:    Data Reviewed: Results for orders placed or performed during the hospital encounter of 11/16/21 (from the past 24 hour(s))  Basic metabolic panel     Status: Abnormal   Collection Time: 11/18/21  1:06 AM  Result Value Ref Range   Sodium 137 135 - 145 mmol/L   Potassium 4.2 3.5 - 5.1 mmol/L   Chloride 108 98 - 111 mmol/L   CO2 22 22 - 32 mmol/L   Glucose, Bld 89 70 - 99 mg/dL   BUN 26 (H) 6 - 20 mg/dL   Creatinine, Ser 1.61 (H) 0.61 - 1.24 mg/dL   Calcium 9.1 8.9 - 09.6 mg/dL   GFR, Estimated 39 (L) >60 mL/min   Anion gap 7 5 - 15  CBC with Differential/Platelet     Status: Abnormal   Collection Time: 11/18/21  1:06 AM  Result Value Ref Range   WBC 4.5 4.0 - 10.5 K/uL   RBC 4.04 (L) 4.22 - 5.81 MIL/uL   Hemoglobin 12.0 (L) 13.0 - 17.0 g/dL   HCT 04.5 (L) 40.9 - 81.1 %   MCV 86.6 80.0 - 100.0 fL   MCH 29.7 26.0 - 34.0 pg   MCHC 34.3 30.0 - 36.0 g/dL   RDW 91.4 78.2 - 95.6 %   Platelets 305 150 - 400 K/uL   nRBC 0.0 0.0 - 0.2 %   Neutrophils Relative % 49 %   Neutro Abs 2.2 1.7 - 7.7 K/uL   Lymphocytes Relative 37 %   Lymphs Abs 1.7 0.7 - 4.0 K/uL   Monocytes Relative 12 %   Monocytes Absolute 0.5 0.1 - 1.0 K/uL   Eosinophils Relative 2 %   Eosinophils Absolute 0.1 0.0 - 0.5 K/uL   Basophils Relative 0 %   Basophils Absolute 0.0 0.0 - 0.1 K/uL   Immature Granulocytes 0 %   Abs Immature Granulocytes 0.01 0.00 - 0.07 K/uL  Magnesium     Status: None   Collection Time: 11/18/21  1:06 AM  Result Value Ref Range   Magnesium 1.9 1.7 - 2.4 mg/dL    I have Reviewed nursing notes, Vitals, and Lab results since pt's last encounter. Pertinent lab results : see above I have ordered test including BMP, CBC, Mg I have reviewed the last note from staff over past 24 hours I have discussed pt's care plan and  test results with nursing staff, case manager   LOS: 1 day   Eric Chamber, MD [facility] 11/18/2021, 5:52 PM

## 2021-11-19 LAB — CBC WITH DIFFERENTIAL/PLATELET
Abs Immature Granulocytes: 0.01 10*3/uL (ref 0.00–0.07)
Basophils Absolute: 0 10*3/uL (ref 0.0–0.1)
Basophils Relative: 1 %
Eosinophils Absolute: 0.1 10*3/uL (ref 0.0–0.5)
Eosinophils Relative: 3 %
HCT: 33.6 % — ABNORMAL LOW (ref 39.0–52.0)
Hemoglobin: 11.6 g/dL — ABNORMAL LOW (ref 13.0–17.0)
Immature Granulocytes: 0 %
Lymphocytes Relative: 30 %
Lymphs Abs: 1.7 10*3/uL (ref 0.7–4.0)
MCH: 29.4 pg (ref 26.0–34.0)
MCHC: 34.5 g/dL (ref 30.0–36.0)
MCV: 85.1 fL (ref 80.0–100.0)
Monocytes Absolute: 0.7 10*3/uL (ref 0.1–1.0)
Monocytes Relative: 12 %
Neutro Abs: 3.1 10*3/uL (ref 1.7–7.7)
Neutrophils Relative %: 54 %
Platelets: 295 10*3/uL (ref 150–400)
RBC: 3.95 MIL/uL — ABNORMAL LOW (ref 4.22–5.81)
RDW: 12.7 % (ref 11.5–15.5)
WBC: 5.6 10*3/uL (ref 4.0–10.5)
nRBC: 0 % (ref 0.0–0.2)

## 2021-11-19 LAB — MAGNESIUM: Magnesium: 1.7 mg/dL (ref 1.7–2.4)

## 2021-11-19 LAB — BASIC METABOLIC PANEL
Anion gap: 10 (ref 5–15)
BUN: 16 mg/dL (ref 6–20)
CO2: 26 mmol/L (ref 22–32)
Calcium: 8.9 mg/dL (ref 8.9–10.3)
Chloride: 101 mmol/L (ref 98–111)
Creatinine, Ser: 1.47 mg/dL — ABNORMAL HIGH (ref 0.61–1.24)
GFR, Estimated: 59 mL/min — ABNORMAL LOW (ref >60)
Glucose, Bld: 129 mg/dL — ABNORMAL HIGH (ref 70–99)
Potassium: 3.8 mmol/L (ref 3.5–5.1)
Sodium: 137 mmol/L (ref 135–145)

## 2021-11-19 NOTE — Discharge Summary (Signed)
Physician Discharge Summary   Eric [name] OEV:[id number] DOB: Jul 28, 1975 DOA: 11/16/2021  PCP: Gracelyn Nurse, MD  Admit date: 11/16/2021 Discharge date: 11/19/2021   Admitted From: Home Disposition:  Home Discharging physician: Lewie Chamber, MD  Recommendations for Outpatient Follow-up:  Repeat BMP at follow up  Home Health:  Equipment/Devices:   Discharge Condition: stable CODE STATUS: Full Diet recommendation:  Diet Orders (From admission, onward)     Start     Ordered   11/18/21 1152  DIET DYS 3 Room service appropriate? Yes; Fluid consistency: Thin  Diet effective now       Question Answer Comment  Room service appropriate? Yes   Fluid consistency: Thin      11/18/21 1151            Hospital Course: Eric [name] is a 46 yo male with PMH depression, GERD, obesity s/p lap-band, DMII who presented after referral from urgent care for abnormal renal function.  He recently had approximately 21 teeth extracted on 11/10/2021 and was on antibiotics pre and post extraction for a total of about 2 weeks.  He endorsed having a foul taste in his mouth recently as well.  He has also been having intractable nausea and vomiting with inability to keep in adequate nutrition.  At urgent care he was found to have elevated creatinine.  On repeat labs in the ER, his creatinine was 3.9 with a normal baseline 1 year ago. He was admitted for initiation of IV fluids and further monitoring. Renal function improved with IVF and his N/V resolved and he was able to tolerate a diet prior to discharge.   Assessment and Plan: * AKI (acute kidney injury) (HCC) - baseline creatinine ~ 1 - patient presents with increase in creat >0.3 mg/dL above baseline, creat increase >1.5x baseline presumed to have occurred within past 7 days PTA - suspected pre-renal from N/V but at risk for ATN due to prolonged pre-renal state - creat 3.9 on admission; has improved some with IVF - normal appearance of kidneys on CT  A/P with no hydro - UA noted with granular casts -Creatinine improved to 1.47 at time of discharge  Nausea and vomiting-resolved as of 11/18/2021 - Possibly lingering effect of teeth extraction and may be some contribution from antibiotic use - Low suspicion for underlying infection at this time - CT abdomen/pelvis negative for acute findings -Diet advanced slowly and patient tolerated diet at discharge  Thrush - Possibly from antibiotic use and ongoing nausea/vomiting - treated with nystatin   History of diet-controlled diabetes - diet controlled       The patient's chronic medical conditions were treated accordingly per the patient's home medication regimen except as noted.  On day of discharge, patient was felt deemed stable for discharge. Patient/family member advised to call PCP or come back to ER if needed.   Principal Diagnosis: AKI (acute kidney injury) [facility])  Discharge Diagnoses: Active Hospital Problems   Diagnosis Date Noted   AKI (acute kidney injury) (HCC) 08/21/2020    Priority: 1.   Thrush 11/17/2021   History of diet-controlled diabetes 12/27/2009    Resolved Hospital Problems   Diagnosis Date Noted Date Resolved   Nausea and vomiting 11/17/2021 11/18/2021    Priority: 2.     Discharge Instructions     Increase activity slowly   Complete by: As directed       Allergies as of 11/19/2021   No Known Allergies      Medication List  STOP taking these medications    amoxicillin 500 MG tablet Commonly known as: AMOXIL       TAKE these medications    ibuprofen 200 MG tablet Commonly known as: ADVIL Take 200 mg by mouth every 6 (six) hours as needed for mild pain.   pantoprazole 40 MG tablet Commonly known as: PROTONIX Take 1 tablet (40 mg total) by mouth at bedtime.   ProAir RespiClick 108 (90 Base) MCG/ACT Aepb Generic drug: Albuterol Sulfate Inhale 1 puff into the lungs every 6 (six) hours as needed.   venlafaxine XR 75 MG 24 hr  capsule Commonly known as: EFFEXOR-XR Take 2 capsules (150 mg total) by mouth daily.   zolpidem 10 MG tablet Commonly known as: AMBIEN Take 10 mg by mouth at bedtime as needed for sleep.        No Known Allergies  Consultations:   Procedures:   Discharge Exam: BP 128/90 (BP Location: Left Arm)   Pulse 76   Temp 98.1 F (36.7 C) (Oral)   Resp 16   Ht 5' 5.04" (1.652 m)   Wt 78.9 kg   SpO2 100%   BMI 28.92 kg/m  Physical Exam Constitutional:      General: He is not in acute distress.    Appearance: He is well-developed. He is not ill-appearing.  HENT:     Head: Normocephalic and atraumatic.     Mouth/Throat:     Mouth: Mucous membranes are moist.  Eyes:     Extraocular Movements: Extraocular movements intact.  Cardiovascular:     Rate and Rhythm: Normal rate and regular rhythm.     Heart sounds: Normal heart sounds.  Pulmonary:     Effort: Pulmonary effort is normal. No respiratory distress.     Breath sounds: Normal breath sounds. No wheezing.  Abdominal:     General: Bowel sounds are normal. There is no distension.     Palpations: Abdomen is soft.     Tenderness: There is no abdominal tenderness.  Musculoskeletal:        General: Normal range of motion.     Cervical back: Normal range of motion and neck supple.  Skin:    General: Skin is warm and dry.  Neurological:     General: No focal deficit present.     Mental Status: He is alert.  Psychiatric:        Mood and Affect: Mood normal.        Behavior: Behavior normal.      The results of significant diagnostics from this hospitalization (including imaging, microbiology, ancillary and laboratory) are listed below for reference.   Microbiology: Recent Results (from the past 240 hour(s))  Culture, blood (Routine X 2) w Reflex to ID Panel     Status: None (Preliminary result)   Collection Time: 11/17/21  2:10 AM   Specimen: BLOOD  Result Value Ref Range Status   Specimen Description BLOOD LEFT  ANTECUBITAL  Final   Special Requests   Final    BOTTLES DRAWN AEROBIC AND ANAEROBIC Blood Culture adequate volume   Culture   Final    NO GROWTH 2 DAYS Performed at [facility], [address] [place], [place] [address]    Report Status PENDING  Incomplete  Culture, blood (Routine X 2) w Reflex to ID Panel     Status: None (Preliminary result)   Collection Time: 11/17/21  2:15 AM   Specimen: BLOOD  Result Value Ref Range Status   Specimen Description  BLOOD RIGHT ANTECUBITAL  Final   Special Requests   Final    BOTTLES DRAWN AEROBIC AND ANAEROBIC Blood Culture adequate volume   Culture   Final    NO GROWTH 2 DAYS Performed at [facility], [address], [place], [place] [address]    Report Status PENDING  Incomplete     Labs: BNP (last 3 results) No results for input(s): "BNP" in the last 8760 hours. Basic Metabolic Panel: Recent Labs  Lab 11/16/21 1345 11/16/21 1620 11/17/21 0333 11/18/21 0106 11/19/21 0047  NA 138 137 136 137 137  K 4.5 4.5 4.3 4.2 3.8  CL 103 105 105 108 101  CO2 19* 20* 21* 22 26  GLUCOSE 82 81 83 89 129*  BUN 32* 31* 29* 26* 16  CREATININE 3.90* 3.75* 3.14* 2.06* 1.47*  CALCIUM 10.0 9.7 9.3 9.1 8.9  MG  --   --   --  1.9 1.7   Liver Function Tests: Recent Labs  Lab 11/16/21 1345 11/16/21 1620  AST 26 25  ALT 17 17  ALKPHOS 58 57  BILITOT 1.7* 1.6*  PROT 8.1 7.9  ALBUMIN 4.4 4.5   Recent Labs  Lab 11/16/21 1345 11/16/21 1620  LIPASE 28 28   No results for input(s): "AMMONIA" in the last 168 hours. CBC: Recent Labs  Lab 11/16/21 1345 11/16/21 1620 11/17/21 0333 11/18/21 0106 11/19/21 0047  WBC 8.5 7.2 6.4 4.5 5.6  NEUTROABS 6.8 5.6  --  2.2 3.1  HGB 13.8 13.8 12.6* 12.0* 11.6*  HCT 39.4 40.7 35.9* 35.0* 33.6*  MCV 84.7 87.9 85.9 86.6 85.1  PLT 336 324 305 305 295   Cardiac Enzymes: No results for input(s): "CKTOTAL", "CKMB", "CKMBINDEX", "TROPONINI" in the last 168 hours. BNP: Invalid input(s):  "POCBNP" CBG: No results for input(s): "GLUCAP" in the last 168 hours. D-Dimer No results for input(s): "DDIMER" in the last 72 hours. Hgb A1c No results for input(s): "HGBA1C" in the last 72 hours. Lipid Profile No results for input(s): "CHOL", "HDL", "LDLCALC", "TRIG", "CHOLHDL", "LDLDIRECT" in the last 72 hours. Thyroid function studies No results for input(s): "TSH", "T4TOTAL", "T3FREE", "THYROIDAB" in the last 72 hours.  Invalid input(s): "FREET3" Anemia work up No results for input(s): "VITAMINB12", "FOLATE", "FERRITIN", "TIBC", "IRON", "RETICCTPCT" in the last 72 hours. Urinalysis    Component Value Date/Time   COLORURINE YELLOW 11/16/2021 2311   APPEARANCEUR HAZY (A) 11/16/2021 2311   APPEARANCEUR Clear 08/05/2012 0100   LABSPEC 1.010 11/16/2021 2311   LABSPEC 1.012 08/05/2012 0100   PHURINE 5.0 11/16/2021 2311   GLUCOSEU NEGATIVE 11/16/2021 2311   GLUCOSEU Negative 08/05/2012 0100   HGBUR SMALL (A) 11/16/2021 2311   BILIRUBINUR NEGATIVE 11/16/2021 2311   BILIRUBINUR Negative 03/10/2015 1410   BILIRUBINUR Negative 08/05/2012 0100   KETONESUR 5 (A) 11/16/2021 2311   PROTEINUR 30 (A) 11/16/2021 2311   UROBILINOGEN 0.2 03/10/2015 1410   NITRITE NEGATIVE 11/16/2021 2311   LEUKOCYTESUR NEGATIVE 11/16/2021 2311   LEUKOCYTESUR Trace 08/05/2012 0100   Sepsis Labs Recent Labs  Lab 11/16/21 1620 11/17/21 0333 11/18/21 0106 11/19/21 0047  WBC 7.2 6.4 4.5 5.6   Microbiology Recent Results (from the past 240 hour(s))  Culture, blood (Routine X 2) w Reflex to ID Panel     Status: None (Preliminary result)   Collection Time: 11/17/21  2:10 AM   Specimen: BLOOD  Result Value Ref Range Status   Specimen Description BLOOD LEFT ANTECUBITAL  Final   Special Requests   Final  BOTTLES DRAWN AEROBIC AND ANAEROBIC Blood Culture adequate volume   Culture   Final    NO GROWTH 2 DAYS Performed at [facility], [address] [place], [place] [address]    Report Status  PENDING  Incomplete  Culture, blood (Routine X 2) w Reflex to ID Panel     Status: None (Preliminary result)   Collection Time: 11/17/21  2:15 AM   Specimen: BLOOD  Result Value Ref Range Status   Specimen Description BLOOD RIGHT ANTECUBITAL  Final   Special Requests   Final    BOTTLES DRAWN AEROBIC AND ANAEROBIC Blood Culture adequate volume   Culture   Final    NO GROWTH 2 DAYS Performed at [facility], [address], [place], [place] [address]    Report Status PENDING  Incomplete    Procedures/Studies: CT Abdomen Pelvis Wo Contrast  Result Date: 11/17/2021 CLINICAL DATA:  Vomiting and dizziness. EXAM: CT ABDOMEN AND PELVIS WITHOUT CONTRAST TECHNIQUE: Multidetector CT imaging of the abdomen and pelvis was performed following the standard protocol without IV contrast. RADIATION DOSE REDUCTION: This exam was performed according to the departmental dose-optimization program which includes automated exposure control, adjustment of the mA and/or kV according to patient size and/or use of iterative reconstruction technique. COMPARISON:  August 21, 2020 FINDINGS: Lower chest: No acute abnormality. Hepatobiliary: No focal liver abnormality is seen. No gallstones, gallbladder wall thickening, or biliary dilatation. Pancreas: Unremarkable. No pancreatic ductal dilatation or surrounding inflammatory changes. Spleen: Normal in size without focal abnormality. Adrenals/Urinary Tract: Adrenal glands are unremarkable. Kidneys are normal, without renal calculi, focal lesion, or hydronephrosis. Bladder is unremarkable. Stomach/Bowel: There is a small hiatal hernia. Evidence of prior gastric banding surgery is seen. The appendix is surgically absent. No evidence of bowel wall thickening, distention, or inflammatory changes. Vascular/Lymphatic: No significant vascular findings are present. There is mild, stable bilateral inguinal lymphadenopathy, right greater than left. Reproductive: Prostate is unremarkable.  Other: No abdominal wall hernia or abnormality. No abdominopelvic ascites. Musculoskeletal: No acute or significant osseous findings. IMPRESSION: 1. Small hiatal hernia with evidence of prior gastric banding surgery. 2. No acute or active process within the abdomen or pelvis. Electronically Signed   By: Aram Candela M.D.   On: 11/17/2021 01:04     Time coordinating discharge: Over 30 minutes    Lewie Chamber, MD  [facility] 11/19/2021, 3:10 PM

## 2021-11-19 NOTE — Progress Notes (Signed)
Mobility Specialist Progress Note:   11/19/21 0912  Mobility  Activity Ambulated independently in hallway  Level of Assistance Independent  Assistive Device None  Distance Ambulated (ft) 300 ft  Activity Response Tolerated well  $Mobility charge 1 Mobility   Pt ambulating independently in hallway with iv pole. No complaints. Left in room with all needs met.  [facility] Lemya Greenwell Mobility Specialist

## 2021-11-19 NOTE — Progress Notes (Signed)
RN gave patient discharge instructions and he stated understanding IV has been removed and he is dressed no new medications added. pateint wanted to walk out

## 2021-11-22 LAB — CULTURE, BLOOD (ROUTINE X 2)
Culture: NO GROWTH
Culture: NO GROWTH
Special Requests: ADEQUATE
Special Requests: ADEQUATE

## 2022-03-10 ENCOUNTER — Encounter (HOSPITAL_BASED_OUTPATIENT_CLINIC_OR_DEPARTMENT_OTHER): Payer: Self-pay

## 2022-03-10 ENCOUNTER — Emergency Department (HOSPITAL_BASED_OUTPATIENT_CLINIC_OR_DEPARTMENT_OTHER): Payer: Self-pay | Admitting: Radiology

## 2022-03-10 ENCOUNTER — Other Ambulatory Visit: Payer: Self-pay

## 2022-03-10 ENCOUNTER — Emergency Department (HOSPITAL_BASED_OUTPATIENT_CLINIC_OR_DEPARTMENT_OTHER)
Admission: EM | Admit: 2022-03-10 | Discharge: 2022-03-11 | Disposition: A | Discharge status: Home / Self Care | Payer: Self-pay | Attending: Emergency Medicine | Admitting: Emergency Medicine

## 2022-03-10 ENCOUNTER — Emergency Department (HOSPITAL_BASED_OUTPATIENT_CLINIC_OR_DEPARTMENT_OTHER): Payer: Self-pay

## 2022-03-10 DIAGNOSIS — R0602 Shortness of breath: Secondary | ICD-10-CM | POA: Insufficient documentation

## 2022-03-10 DIAGNOSIS — R1031 Right lower quadrant pain: Secondary | ICD-10-CM | POA: Insufficient documentation

## 2022-03-10 DIAGNOSIS — R059 Cough, unspecified: Secondary | ICD-10-CM | POA: Insufficient documentation

## 2022-03-10 DIAGNOSIS — Z20822 Contact with and (suspected) exposure to covid-19: Secondary | ICD-10-CM | POA: Insufficient documentation

## 2022-03-10 DIAGNOSIS — R519 Headache, unspecified: Secondary | ICD-10-CM | POA: Insufficient documentation

## 2022-03-10 DIAGNOSIS — R1030 Lower abdominal pain, unspecified: Secondary | ICD-10-CM

## 2022-03-10 DIAGNOSIS — M545 Low back pain, unspecified: Secondary | ICD-10-CM | POA: Insufficient documentation

## 2022-03-10 DIAGNOSIS — E119 Type 2 diabetes mellitus without complications: Secondary | ICD-10-CM | POA: Insufficient documentation

## 2022-03-10 DIAGNOSIS — R0789 Other chest pain: Secondary | ICD-10-CM | POA: Insufficient documentation

## 2022-03-10 LAB — URINALYSIS, ROUTINE W REFLEX MICROSCOPIC
Bilirubin Urine: NEGATIVE
Glucose, UA: NEGATIVE mg/dL
Hgb urine dipstick: NEGATIVE
Ketones, ur: 40 mg/dL — AB
Leukocytes,Ua: NEGATIVE
Nitrite: NEGATIVE
Protein, ur: 30 mg/dL — AB
Specific Gravity, Urine: >1.046 — ABNORMAL HIGH (ref 1.005–1.030)
pH: 5.5 (ref 5.0–8.0)

## 2022-03-10 LAB — COMPREHENSIVE METABOLIC PANEL
ALT: 16 U/L (ref 0–44)
AST: 26 U/L (ref 15–41)
Albumin: 4.8 g/dL (ref 3.5–5.0)
Alkaline Phosphatase: 54 U/L (ref 38–126)
Anion gap: 13 (ref 5–15)
BUN: 20 mg/dL (ref 6–20)
CO2: 25 mmol/L (ref 22–32)
Calcium: 9.9 mg/dL (ref 8.9–10.3)
Chloride: 97 mmol/L — ABNORMAL LOW (ref 98–111)
Creatinine, Ser: 1.26 mg/dL — ABNORMAL HIGH (ref 0.61–1.24)
GFR, Estimated: >60 mL/min (ref >60)
Glucose, Bld: 102 mg/dL — ABNORMAL HIGH (ref 70–99)
Potassium: 3.8 mmol/L (ref 3.5–5.1)
Sodium: 135 mmol/L (ref 135–145)
Total Bilirubin: 0.9 mg/dL (ref 0.3–1.2)
Total Protein: 8.7 g/dL — ABNORMAL HIGH (ref 6.5–8.1)

## 2022-03-10 LAB — CBC
HCT: 44.6 % (ref 39.0–52.0)
Hemoglobin: 15.5 g/dL (ref 13.0–17.0)
MCH: 29.8 pg (ref 26.0–34.0)
MCHC: 34.8 g/dL (ref 30.0–36.0)
MCV: 85.8 fL (ref 80.0–100.0)
Platelets: 242 10*3/uL (ref 150–400)
RBC: 5.2 MIL/uL (ref 4.22–5.81)
RDW: 13.1 % (ref 11.5–15.5)
WBC: 7.1 10*3/uL (ref 4.0–10.5)
nRBC: 0 % (ref 0.0–0.2)

## 2022-03-10 LAB — RESP PANEL BY RT-PCR (FLU A&B, COVID) ARPGX2
Influenza A by PCR: NEGATIVE
Influenza B by PCR: NEGATIVE
SARS Coronavirus 2 by RT PCR: NEGATIVE

## 2022-03-10 LAB — TROPONIN I (HIGH SENSITIVITY)
Troponin I (High Sensitivity): 2 ng/L (ref <18)
Troponin I (High Sensitivity): 2 ng/L (ref <18)

## 2022-03-10 LAB — LIPASE, BLOOD: Lipase: 18 U/L (ref 11–51)

## 2022-03-10 MED ORDER — IOHEXOL 300 MG/ML  SOLN
100.0000 mL | Freq: Once | INTRAMUSCULAR | Status: AC | PRN
  Administered 2022-03-10: 80 mL via INTRAVENOUS

## 2022-03-10 MED ORDER — HYDROMORPHONE HCL 1 MG/ML IJ SOLN
1.0000 mg | Freq: Once | INTRAMUSCULAR | Status: AC
  Administered 2022-03-10: 1 mg via INTRAVENOUS
  Filled 2022-03-10: qty 1

## 2022-03-10 MED ORDER — SODIUM CHLORIDE 0.9 % IV BOLUS
1000.0000 mL | Freq: Once | INTRAVENOUS | Status: AC
  Administered 2022-03-10: 1000 mL via INTRAVENOUS

## 2022-03-10 MED ORDER — SODIUM CHLORIDE 0.9 % IV BOLUS
500.0000 mL | Freq: Once | INTRAVENOUS | Status: AC
Start: 2022-03-10 — End: 2022-03-10
  Administered 2022-03-10: 500 mL via INTRAVENOUS

## 2022-03-10 NOTE — ED Provider Notes (Signed)
[place] EMERGENCY DEPT Provider Note   CSN: [id number] Arrival date & time: 03/10/22  1551     History  Chief Complaint  Patient presents with   Back Pain    Eric [name] is a 46 y.o. male.  HPI 46 year old male presents with abdominal and back pain.  Started 2 days ago.  Is not as bad as it was though he still having a lot of pain in the right side of his back.  He is also developed cough, shortness of breath, chest pressure, and yesterday he had vomiting.  He had a temperature over 102 yesterday.  No fever today.  A little bit of a headache but no sore throat.  Right now the pain is about a 7 or 8.  He has a previous history of a lap band.  He used to have diabetes but that resolved after the surgery.  Home Medications Prior to Admission medications   Medication Sig Start Date End Date Taking? Authorizing Provider  Albuterol Sulfate (PROAIR RESPICLICK) 323 (90 Base) MCG/ACT AEPB Inhale 1 puff into the lungs every 6 (six) hours as needed. 08/23/20   Fritzi Mandes, MD  ibuprofen (ADVIL) 200 MG tablet Take 200 mg by mouth every 6 (six) hours as needed for mild pain.    [provider]  pantoprazole (PROTONIX) 40 MG tablet Take 1 tablet (40 mg total) by mouth at bedtime. 10/02/19   Nance Pear, MD  venlafaxine XR (EFFEXOR-XR) 75 MG 24 hr capsule Take 2 capsules (150 mg total) by mouth daily. 10/02/19   Nance Pear, MD  zolpidem (AMBIEN) 10 MG tablet Take 10 mg by mouth at bedtime as needed for sleep.    [provider]      Allergies    Patient has no known allergies.    Review of Systems   Review of Systems  Constitutional:  Positive for fever.  HENT:  Negative for sore throat.   Respiratory:  Positive for cough and shortness of breath.   Cardiovascular:  Positive for chest pain.  Gastrointestinal:  Positive for abdominal pain and vomiting. Negative for diarrhea.  Genitourinary:  Negative for dysuria.  Musculoskeletal:  Positive for  back pain.    Physical Exam Updated Vital Signs BP (!) 132/90   Pulse 79   Temp 98 F (36.7 C) (Oral)   Resp 14   Ht 5\' 5"  (1.651 m)   Wt 78.9 kg   SpO2 99%   BMI 28.95 kg/m  Physical Exam Vitals and nursing note reviewed.  Constitutional:      Appearance: He is well-developed.  HENT:     Head: Normocephalic and atraumatic.  Cardiovascular:     Rate and Rhythm: Normal rate and regular rhythm.     Heart sounds: Normal heart sounds.  Pulmonary:     Effort: Pulmonary effort is normal.     Breath sounds: Normal breath sounds.  Abdominal:     Palpations: Abdomen is soft.     Tenderness: There is abdominal tenderness in the right lower quadrant. There is right CVA tenderness.  Musculoskeletal:     Lumbar back: Tenderness present. No bony tenderness.  Skin:    General: Skin is warm and dry.  Neurological:     Mental Status: He is alert.     Comments: 5/5 strength in BLE. Grossly normal sensation     ED Results / Procedures / Treatments   Labs (all labs ordered are listed, but only abnormal results are displayed) Labs Reviewed  COMPREHENSIVE METABOLIC PANEL - Abnormal; Notable for the following components:      Result Value   Chloride 97 (*)    Glucose, Bld 102 (*)    Creatinine, Ser 1.26 (*)    Total Protein 8.7 (*)    All other components within normal limits  RESP PANEL BY RT-PCR (FLU A&B, COVID) ARPGX2  LIPASE, BLOOD  CBC  URINALYSIS, ROUTINE W REFLEX MICROSCOPIC  TROPONIN I (HIGH SENSITIVITY)  TROPONIN I (HIGH SENSITIVITY)    EKG EKG Interpretation  Date/Time:  Saturday March 10 2022 16:08:50 EDT Ventricular Rate:  117 PR Interval:  126 QRS Duration: 74 QT Interval:  302 QTC Calculation: 421 R Axis:   71 Text Interpretation: Sinus tachycardia Right atrial enlargement Borderline ECG no significant change since April 2022 Confirmed by Sherwood Gambler [phone]) on 03/10/2022 7:50:52 PM  Radiology DG Chest 2 View  Result Date: 03/10/2022 CLINICAL  DATA:  Abdominal pain, chest pain EXAM: CHEST - 2 VIEW COMPARISON:  08/21/2020 FINDINGS: The heart size and mediastinal contours are within normal limits. Both lungs are clear. The visualized skeletal structures are unremarkable. IMPRESSION: No active cardiopulmonary disease. Electronically Signed   By: Rolm Baptise M.D.   On: 03/10/2022 20:03    Procedures Procedures    Medications Ordered in ED Medications  sodium chloride 0.9 % bolus 1,000 mL (has no administration in time range)  HYDROmorphone (DILAUDID) injection 1 mg (has no administration in time range)    ED Course/ Medical Decision Making/ A&P                           Medical Decision Making Amount and/or Complexity of Data Reviewed Labs: ordered.    Details: Normal WBC, hemoglobin.  No significant electrolyte disturbance.  Lipase, COVID, flu, troponins normal. Radiology: ordered and independent interpretation performed.    Details: Chest x-ray without pneumonia.  CT without obvious diverticulitis or appendicitis. ECG/medicine tests: independent interpretation performed.    Details: No acute ischemia on EKG.  Risk Prescription drug management.   Patient reports fever at home though he is afebrile here and besides some transient tachycardia his vitals are normal.  He denies any specific urinary symptoms.  At this point with his multiple systems involved including cough, vomiting, chest pain, myalgias, back and abdominal pain, I think he probably has a viral illness.  There is no midline back pain or signs/symptoms of spinal cord emergencies I do not think he has a spinal cord infection at this time.  I do not think emergent MRI needed.  WBC is normal.  At this point, urine is pending and while I do think UTI is less likely given no specific symptoms, he did mostly have lower abdominal pain and some back pains I think is warranted to check.  Care transferred to Dr. Sedonia Small.         Final Clinical Impression(s) / ED  Diagnoses Final diagnoses:  None    Rx / DC Orders ED Discharge Orders     None         Sherwood Gambler, MD 03/10/22 2325

## 2022-03-10 NOTE — ED Triage Notes (Signed)
Patient here POV from Home.  Endorses Lower Back Pain that began a few days. States Pain has been radiating from lower Back to Chest and Epigastric Region. Associated with Some SOB.  Associated with Some N/V. 102.3 Fever Last PM.   NAD Noted during Triage. A&Ox4. GCS 15. Ambulatory

## 2022-03-10 NOTE — Discharge Instructions (Signed)
If you develop worsening, continued, or recurrent abdominal pain, uncontrolled vomiting, fever, chest or back pain, or any other new/concerning symptoms then return to the ER for evaluation.  

## 2022-03-10 NOTE — ED Provider Notes (Signed)
  Provider Note MRN:  [id number]  Arrival date & time: 03/10/22    ED Course and Medical Decision Making  Assumed care from Dr. Regenia Skeeter at shift change.  Suspected  viral illness awaiting UA, plan is for dc  Procedures  Final Clinical Impressions(s) / ED Diagnoses  No diagnosis found.  ED Discharge Orders     None       Discharge Instructions   None     Barth Kirks. Sedonia Small, [place] [email or web address]    Maudie Flakes, MD 03/11/22 [phone]

## 2022-03-10 NOTE — ED Notes (Signed)
In to encourage patient to give a urine specimen.the patient. Was OOB trying to use the urinal.

## 2022-03-10 NOTE — ED Notes (Signed)
Aware of need for urine sample, urinal at bedside 

## 2022-12-05 ENCOUNTER — Ambulatory Visit
Admission: EM | Admit: 2022-12-05 | Discharge: 2022-12-05 | Disposition: A | Discharge status: Home / Self Care | Payer: Self-pay | Attending: Internal Medicine | Admitting: Internal Medicine

## 2022-12-05 DIAGNOSIS — S61211A Laceration without foreign body of left index finger without damage to nail, initial encounter: Secondary | ICD-10-CM

## 2022-12-05 DIAGNOSIS — Z23 Encounter for immunization: Secondary | ICD-10-CM

## 2022-12-05 MED ORDER — TETANUS-DIPHTH-ACELL PERTUSSIS 5-2.5-18.5 LF-MCG/0.5 IM SUSY
0.5000 mL | PREFILLED_SYRINGE | Freq: Once | INTRAMUSCULAR | Status: AC
  Administered 2022-12-05: 0.5 mL via INTRAMUSCULAR

## 2022-12-05 NOTE — Discharge Instructions (Signed)

## 2022-12-05 NOTE — ED Provider Notes (Signed)
[facility]    CSN: [id number] Arrival date & time: 12/05/22  4098      History   Chief Complaint Chief Complaint  Patient presents with   Finger Injury    HPI Eric [name] is a 47 y.o. male.   Patient presents to urgent care for evaluation of laceration to the medial aspect of the left pointer finger proximal to the PIP joint that happened less than 2 hours ago.  Patient was using his pocket knife to cut plastic off of a new toilet when he accidentally cut his left finger.  Bleeding controlled.  Full range of motion of affected digit.  No numbness or tingling distally.  Has not attempted use of any over-the-counter medications to help with symptoms of work on an urgent care.  Unsure of date of last Tdap.     Past Medical History:  Diagnosis Date   Acid reflux    COVID-19    Depression    Overweight(278.02)     Patient Active Problem List   Diagnosis Date Noted   Thrush 11/17/2021   Sepsis due to pneumonia (HCC) 08/21/2020   AKI (acute kidney injury) (HCC) 08/21/2020   Depression    Anxiety and depression 11/02/2010   History of diet-controlled diabetes 12/27/2009   GERD 12/21/2008   INSOMNIA 05/19/2008    Past Surgical History:  Procedure Laterality Date   APPENDECTOMY  1988   ESOPHAGOGASTRODUODENOSCOPY  05/26/2004   Dr. Arlyce Dice   LAPAROSCOPIC GASTRIC BANDING  08/2010       Home Medications    Prior to Admission medications   Medication Sig Start Date End Date Taking? Authorizing Provider  Albuterol Sulfate (PROAIR RESPICLICK) 108 (90 Base) MCG/ACT AEPB Inhale 1 puff into the lungs every 6 (six) hours as needed. 08/23/20   Enedina Finner, MD  ibuprofen (ADVIL) 200 MG tablet Take 200 mg by mouth every 6 (six) hours as needed for mild pain.    [provider]  pantoprazole (PROTONIX) 40 MG tablet Take 1 tablet (40 mg total) by mouth at bedtime. 10/02/19   Phineas Semen, MD  venlafaxine XR (EFFEXOR-XR) 75 MG 24 hr capsule Take 2  capsules (150 mg total) by mouth daily. 10/02/19   Phineas Semen, MD  zolpidem (AMBIEN) 10 MG tablet Take 10 mg by mouth at bedtime as needed for sleep.    [provider]    Family History Family History  Problem Relation Age of Onset   Heart disease Father    Heart attack Father 66   Diabetes Mellitus II Father    Diabetes Mellitus II Mother    Hepatitis B Mother     Social History Social History   Tobacco Use   Smoking status: Never   Smokeless tobacco: Current    Types: Chew   Tobacco comments:    Pouches-3-4 pouches/day for 6-8 years-in the process of quitting  Vaping Use   Vaping status: Never Used  Substance Use Topics   Alcohol use: Not Currently   Drug use: No     Allergies   Patient has no known allergies.   Review of Systems Review of Systems Per HPI  Physical Exam Triage Vital Signs ED Triage Vitals [12/05/22 0955]  Encounter Vitals Group     BP 123/86     Systolic BP Percentile      Diastolic BP Percentile      Pulse Rate 83     Resp 20     Temp (!)  97.5 F (36.4 C)     Temp Source Oral     SpO2 98 %     Weight      Height      Head Circumference      Peak Flow      Pain Score 0     Pain Loc      Pain Education      Exclude from Growth Chart    No data found.  Updated Vital Signs BP 123/86 (BP Location: Right Arm)   Pulse 83   Temp (!) 97.5 F (36.4 C) (Oral)   Resp 20   SpO2 98%   Visual Acuity Right Eye Distance:   Left Eye Distance:   Bilateral Distance:    Right Eye Near:   Left Eye Near:    Bilateral Near:     Physical Exam Vitals and nursing note reviewed.  Constitutional:      Appearance: He is not ill-appearing or toxic-appearing.  HENT:     Head: Normocephalic and atraumatic.     Right Ear: Hearing and external ear normal.     Left Ear: Hearing and external ear normal.     Nose: Nose normal.     Mouth/Throat:     Lips: Pink.  Eyes:     General: Lids are normal. Vision grossly intact. Gaze  aligned appropriately.     Extraocular Movements: Extraocular movements intact.     Conjunctiva/sclera: Conjunctivae normal.  Pulmonary:     Effort: Pulmonary effort is normal.  Musculoskeletal:     Cervical back: Neck supple.  Skin:    General: Skin is warm and dry.     Capillary Refill: Capillary refill takes less than 2 seconds.     Findings: Laceration present. No rash.     Comments: 2 cm laceration to the medial aspect of the left pointer finger proximal to the PIP joint.  See images below for further details.  No drainage or bleeding to wound.  Full range of motion of the left pointer finger with strength and sensation intact distally.  5/5 grip strength of left upper extremity.  +2 left radial pulse present.  Less than 2 capillary refill.  Neurological:     General: No focal deficit present.     Mental Status: He is alert and oriented to person, place, and time. Mental status is at baseline.     Cranial Nerves: No dysarthria or facial asymmetry.  Psychiatric:        Mood and Affect: Mood normal.        Speech: Speech normal.        Behavior: Behavior normal.        Thought Content: Thought content normal.        Judgment: Judgment normal.   After laceration repair   Before laceration repair    UC Treatments / Results  Labs (all labs ordered are listed, but only abnormal results are displayed) Labs Reviewed - No data to display  EKG   Radiology No results found.  Procedures Laceration Repair  Date/Time: 12/05/2022 10:31 AM  Performed by: Carlisle Beers, FNP Authorized by: Carlisle Beers, FNP   Consent:    Consent obtained:  Verbal   Consent given by:  Patient   Risks, benefits, and alternatives were discussed: yes     Risks discussed:  Infection, need for additional repair, nerve damage, poor wound healing, poor cosmetic result, pain, retained foreign body, tendon damage and vascular damage   Alternatives  discussed:  No treatment Universal  protocol:    Procedure explained and questions answered to patient or proxy's satisfaction: yes     Patient identity confirmed:  Verbally with patient Anesthesia:    Anesthesia method:  Local infiltration   Local anesthetic:  Lidocaine 1% w/o epi Laceration details:    Location:  Finger   Finger location:  L index finger   Length (cm):  2   Depth (mm):  5 Exploration:    Wound extent: no foreign bodies/material noted and no underlying fracture noted   Treatment:    Area cleansed with:  Povidone-iodine   Debridement:  None Skin repair:    Repair method:  Sutures   Suture size:  4-0   Suture material:  Plain gut   Suture technique:  Simple interrupted   Number of sutures:  3 Approximation:    Approximation:  Close Repair type:    Repair type:  Simple Post-procedure details:    Dressing:  Non-adherent dressing   Procedure completion:  Tolerated well, no immediate complications  (including critical care time)  Medications Ordered in UC Medications  Tdap (BOOSTRIX) injection 0.5 mL (has no administration in time range)    Initial Impression / Assessment and Plan / UC Course  I have reviewed the triage vital signs and the nursing notes.  Pertinent labs & imaging results that were available during my care of the patient were reviewed by me and considered in my medical decision making (see chart for details).   1.  Laceration of left index finger without foreign body without damage to nail, initial encounter Laceration repaired, see procedure note above for details. Discussed wound care and cleaning at home.  Imaging:  Infection return precautions discussed.  Suture removal in 10 days.  Tdap updated today.  Tylenol as needed for pain at home.  Advised to rest and avoid activities that may increase tension to wound/sutures or expose wound to infection. Excuse note given.   Counseled patient on potential for adverse effects with medications prescribed/recommended today, strict  ER and return-to-clinic precautions discussed, patient verbalized understanding.    Final Clinical Impressions(s) / UC Diagnoses   Final diagnoses:  Laceration of left index finger without foreign body without damage to nail, initial encounter     Discharge Instructions      Wound care: Please keep the area surrounding the wound/sutures clean and dry for the next 24 hours. After 24 hours, you may get the wound wet. Gently clean wound with antibacterial soap. Do not scrub wound. Cover the area with a nonstick bandage and change the bandage 2 times a day.   You should have the sutures removed in 10 days by your primary care provider or at urgent care. Return sooner than 10 days if you experience discharge from your laceration, redness around your laceration, warmth around your laceration, or fever.   You may take over the counter medicines as needed for aches and pains once the numbing wears off.   Thanks for letting me fix your cut today!     ED Prescriptions   None    PDMP not reviewed this encounter.   Carlisle Beers, [place] 12/05/22 1032

## 2022-12-05 NOTE — ED Triage Notes (Signed)
Pt states he cut left index finger with pocket knife ~9am today-lac noted/bleeding controlled-NAD-steady gait

## 2023-05-29 ENCOUNTER — Emergency Department (HOSPITAL_BASED_OUTPATIENT_CLINIC_OR_DEPARTMENT_OTHER): Payer: 59

## 2023-05-29 ENCOUNTER — Emergency Department (HOSPITAL_BASED_OUTPATIENT_CLINIC_OR_DEPARTMENT_OTHER)
Admission: EM | Admit: 2023-05-29 | Discharge: 2023-05-29 | Disposition: A | Discharge status: Home / Self Care | Payer: 59 | Attending: Emergency Medicine | Admitting: Emergency Medicine

## 2023-05-29 ENCOUNTER — Other Ambulatory Visit (HOSPITAL_BASED_OUTPATIENT_CLINIC_OR_DEPARTMENT_OTHER): Payer: Self-pay

## 2023-05-29 ENCOUNTER — Other Ambulatory Visit: Payer: Self-pay

## 2023-05-29 ENCOUNTER — Encounter (HOSPITAL_BASED_OUTPATIENT_CLINIC_OR_DEPARTMENT_OTHER): Payer: Self-pay | Admitting: Emergency Medicine

## 2023-05-29 DIAGNOSIS — R000 Tachycardia, unspecified: Secondary | ICD-10-CM | POA: Diagnosis not present

## 2023-05-29 DIAGNOSIS — R110 Nausea: Secondary | ICD-10-CM

## 2023-05-29 DIAGNOSIS — Z20822 Contact with and (suspected) exposure to covid-19: Secondary | ICD-10-CM | POA: Diagnosis not present

## 2023-05-29 DIAGNOSIS — J101 Influenza due to other identified influenza virus with other respiratory manifestations: Secondary | ICD-10-CM | POA: Diagnosis not present

## 2023-05-29 DIAGNOSIS — J3489 Other specified disorders of nose and nasal sinuses: Secondary | ICD-10-CM | POA: Insufficient documentation

## 2023-05-29 DIAGNOSIS — R5381 Other malaise: Secondary | ICD-10-CM

## 2023-05-29 DIAGNOSIS — R051 Acute cough: Secondary | ICD-10-CM

## 2023-05-29 DIAGNOSIS — R109 Unspecified abdominal pain: Secondary | ICD-10-CM | POA: Diagnosis not present

## 2023-05-29 LAB — CBC WITH DIFFERENTIAL/PLATELET
Abs Immature Granulocytes: 0.03 10*3/uL (ref 0.00–0.07)
Basophils Absolute: 0 10*3/uL (ref 0.0–0.1)
Basophils Relative: 0 %
Eosinophils Absolute: 0 10*3/uL (ref 0.0–0.5)
Eosinophils Relative: 0 %
HCT: 39 % (ref 39.0–52.0)
Hemoglobin: 13.9 g/dL (ref 13.0–17.0)
Immature Granulocytes: 0 %
Lymphocytes Relative: 9 %
Lymphs Abs: 0.8 10*3/uL (ref 0.7–4.0)
MCH: 29.6 pg (ref 26.0–34.0)
MCHC: 35.6 g/dL (ref 30.0–36.0)
MCV: 83 fL (ref 80.0–100.0)
Monocytes Absolute: 0.9 10*3/uL (ref 0.1–1.0)
Monocytes Relative: 10 %
Neutro Abs: 7.1 10*3/uL (ref 1.7–7.7)
Neutrophils Relative %: 81 %
Platelets: 304 10*3/uL (ref 150–400)
RBC: 4.7 MIL/uL (ref 4.22–5.81)
RDW: 12.9 % (ref 11.5–15.5)
WBC: 8.9 10*3/uL (ref 4.0–10.5)
nRBC: 0 % (ref 0.0–0.2)

## 2023-05-29 LAB — COMPREHENSIVE METABOLIC PANEL
ALT: 9 U/L (ref 0–44)
AST: 17 U/L (ref 15–41)
Albumin: 4.6 g/dL (ref 3.5–5.0)
Alkaline Phosphatase: 65 U/L (ref 38–126)
Anion gap: 11 (ref 5–15)
BUN: 14 mg/dL (ref 6–20)
CO2: 24 mmol/L (ref 22–32)
Calcium: 9.5 mg/dL (ref 8.9–10.3)
Chloride: 98 mmol/L (ref 98–111)
Creatinine, Ser: 1.27 mg/dL — ABNORMAL HIGH (ref 0.61–1.24)
GFR, Estimated: >60 mL/min (ref >60)
Glucose, Bld: 103 mg/dL — ABNORMAL HIGH (ref 70–99)
Potassium: 3.5 mmol/L (ref 3.5–5.1)
Sodium: 133 mmol/L — ABNORMAL LOW (ref 135–145)
Total Bilirubin: 1.4 mg/dL — ABNORMAL HIGH (ref 0.0–1.2)
Total Protein: 8.1 g/dL (ref 6.5–8.1)

## 2023-05-29 LAB — LACTIC ACID, PLASMA: Lactic Acid, Venous: 1.6 mmol/L (ref 0.5–1.9)

## 2023-05-29 LAB — RESP PANEL BY RT-PCR (RSV, FLU A&B, COVID)  RVPGX2
Influenza A by PCR: POSITIVE — AB
Influenza B by PCR: NEGATIVE
Resp Syncytial Virus by PCR: NEGATIVE
SARS Coronavirus 2 by RT PCR: NEGATIVE

## 2023-05-29 LAB — TROPONIN I (HIGH SENSITIVITY): Troponin I (High Sensitivity): 2 ng/L (ref <18)

## 2023-05-29 LAB — LIPASE, BLOOD: Lipase: 15 U/L (ref 11–51)

## 2023-05-29 MED ORDER — SODIUM CHLORIDE 0.9 % IV BOLUS
1000.0000 mL | Freq: Once | INTRAVENOUS | Status: AC
  Administered 2023-05-29: 1000 mL via INTRAVENOUS

## 2023-05-29 MED ORDER — DIPHENHYDRAMINE HCL 50 MG/ML IJ SOLN
50.0000 mg | Freq: Once | INTRAMUSCULAR | Status: AC
  Administered 2023-05-29: 50 mg via INTRAVENOUS
  Filled 2023-05-29: qty 1

## 2023-05-29 MED ORDER — ONDANSETRON 4 MG PO TBDP
4.0000 mg | ORAL_TABLET | Freq: Three times a day (TID) | ORAL | 0 refills | Status: DC | PRN
  Filled 2023-05-29: qty 20, 7d supply, fill #0

## 2023-05-29 MED ORDER — PROCHLORPERAZINE EDISYLATE 10 MG/2ML IJ SOLN
10.0000 mg | Freq: Once | INTRAMUSCULAR | Status: AC
  Administered 2023-05-29: 10 mg via INTRAVENOUS
  Filled 2023-05-29: qty 2

## 2023-05-29 MED ORDER — OSELTAMIVIR PHOSPHATE 75 MG PO CAPS
75.0000 mg | ORAL_CAPSULE | Freq: Two times a day (BID) | ORAL | 0 refills | Status: AC
  Filled 2023-05-29: qty 10, 5d supply, fill #0

## 2023-05-29 NOTE — Discharge Instructions (Signed)
 Your history, exam, and workup today revealed you do have influenza as the cause of your symptoms.  As your vital signs and symptoms have improved, we feel you are safe for discharge home.  Please use the nausea medicine to help with symptoms and consider taking the Tamiflu  to decrease the time course of your flu.  Please follow-up with your primary doctor.  If any symptoms change or worsen acutely, please return to the nearest emergency department.

## 2023-05-29 NOTE — ED Provider Notes (Signed)
 [facility] AT [facility] Provider Note   CSN: [id number] Arrival date & time: 05/29/23  0865     History  Chief Complaint  Patient presents with   Shortness of Breath    Eric [name] is a 48 y.o. male.  The history is provided by the patient and medical records. No language interpreter was used.  Shortness of Breath Severity:  Moderate Onset quality:  Gradual Duration:  2 days Timing:  Constant Progression:  Waxing and waning Chronicity:  New Context: URI   Relieved by:  Nothing Worsened by:  Nothing Ineffective treatments:  None tried Associated symptoms: abdominal pain (mild cramp), chest pain (tightness), cough, headaches and sputum production   Associated symptoms: no diaphoresis, no fever (chills present), no hemoptysis, no neck pain, no rash, no vomiting and no wheezing        Home Medications Prior to Admission medications   Medication Sig Start Date End Date Taking? Authorizing Provider  Albuterol  Sulfate (PROAIR  RESPICLICK) 108 (90 Base) MCG/ACT AEPB Inhale 1 puff into the lungs every 6 (six) hours as needed. 08/23/20   Patel, Sona, MD  ibuprofen  (ADVIL ) 200 MG tablet Take 200 mg by mouth every 6 (six) hours as needed for mild pain.    [provider]  pantoprazole  (PROTONIX ) 40 MG tablet Take 1 tablet (40 mg total) by mouth at bedtime. 10/02/19   Goodman, Graydon, MD  venlafaxine  XR (EFFEXOR -XR) 75 MG 24 hr capsule Take 2 capsules (150 mg total) by mouth daily. 10/02/19   Marylynn Soho, MD  zolpidem  (AMBIEN ) 10 MG tablet Take 10 mg by mouth at bedtime as needed for sleep.    [provider]      Allergies    Patient has no known allergies.    Review of Systems   Review of Systems  Constitutional:  Positive for chills and fatigue. Negative for diaphoresis and fever (chills present).  HENT:  Positive for congestion.   Respiratory:  Positive for cough, sputum production, chest tightness and shortness of  breath. Negative for hemoptysis, wheezing and stridor.   Cardiovascular:  Positive for chest pain (tightness). Negative for palpitations and leg swelling.  Gastrointestinal:  Positive for abdominal pain (mild cramp), diarrhea and nausea. Negative for constipation and vomiting.  Genitourinary:  Positive for decreased urine volume. Negative for dysuria and flank pain.  Musculoskeletal:  Negative for back pain, neck pain and neck stiffness.  Skin:  Negative for rash and wound.  Neurological:  Positive for headaches. Negative for weakness, light-headedness and numbness.  Psychiatric/Behavioral:  Negative for agitation and confusion.   All other systems reviewed and are negative.   Physical Exam Updated Vital Signs BP 135/75   Pulse 100   Temp 98.7 F (37.1 C)   Resp 20   Wt 72.6 kg   SpO2 100%   BMI 26.63 kg/m  Physical Exam Vitals and nursing note reviewed.  Constitutional:      General: He is not in acute distress.    Appearance: He is well-developed. He is not ill-appearing, toxic-appearing or diaphoretic.  HENT:     Head: Normocephalic and atraumatic.     Nose: Congestion and rhinorrhea present.     Mouth/Throat:     Mouth: Mucous membranes are dry.     Pharynx: No oropharyngeal exudate or posterior oropharyngeal erythema.  Eyes:     Extraocular Movements: Extraocular movements intact.     Conjunctiva/sclera: Conjunctivae normal.     Pupils: Pupils are equal, round, and  reactive to light.  Cardiovascular:     Rate and Rhythm: Regular rhythm. Tachycardia present.     Pulses: Normal pulses.     Heart sounds: No murmur heard. Pulmonary:     Effort: Pulmonary effort is normal. No respiratory distress.     Breath sounds: Normal breath sounds. No wheezing, rhonchi or rales.  Chest:     Chest wall: No tenderness.  Abdominal:     General: Abdomen is flat.     Palpations: Abdomen is soft.     Tenderness: There is no abdominal tenderness. There is no right CVA tenderness, left  CVA tenderness, guarding or rebound.  Musculoskeletal:        General: No swelling or tenderness.     Cervical back: Neck supple. No tenderness.     Right lower leg: No edema.     Left lower leg: No edema.  Skin:    General: Skin is warm and dry.     Capillary Refill: Capillary refill takes less than 2 seconds.     Findings: No erythema or rash.  Neurological:     General: No focal deficit present.     Mental Status: He is alert.  Psychiatric:        Mood and Affect: Mood normal.     ED Results / Procedures / Treatments   Labs (all labs ordered are listed, but only abnormal results are displayed) Labs Reviewed  RESP PANEL BY RT-PCR (RSV, FLU A&B, COVID)  RVPGX2 - Abnormal; Notable for the following components:      Result Value   Influenza A by PCR POSITIVE (*)    All other components within normal limits  COMPREHENSIVE METABOLIC PANEL - Abnormal; Notable for the following components:   Sodium 133 (*)    Glucose, Bld 103 (*)    Creatinine, Ser 1.27 (*)    Total Bilirubin 1.4 (*)    All other components within normal limits  CBC WITH DIFFERENTIAL/PLATELET  LIPASE, BLOOD  LACTIC ACID, PLASMA  URINALYSIS, W/ REFLEX TO CULTURE (INFECTION SUSPECTED)  TROPONIN I (HIGH SENSITIVITY)    EKG EKG Interpretation Date/Time:  Wednesday May 29 2023 08:36:10 EST Ventricular Rate:  102 PR Interval:  129 QRS Duration:  86 QT Interval:  313 QTC Calculation: 408 R Axis:   66  Text Interpretation: Sinus tachycardia when comapred to prior, similar appearance. No STEMI Confirmed by Wynell Heath ([id number]) on 05/29/2023 8:53:01 AM  Radiology DG Chest Portable 1 View Result Date: 05/29/2023 CLINICAL DATA:  productive cough, tachycrdia, SOB EXAM: PORTABLE CHEST 1 VIEW COMPARISON:  CXR 03/10/22 FINDINGS: No pleural effusion. No pneumothorax. Normal cardiac and mediastinal contours. No focal airspace opacity. No radiographically apparent displaced rib fractures. Visualized upper abdomen  is unremarkable. IMPRESSION: No focal airspace opacity. Electronically Signed   By: Clora Dane M.D.   On: 05/29/2023 10:31    Procedures Procedures    Medications Ordered in ED Medications  sodium chloride  0.9 % bolus 1,000 mL (0 mLs Intravenous Stopped 05/29/23 1047)  prochlorperazine  (COMPAZINE ) injection 10 mg (10 mg Intravenous Given 05/29/23 0916)  diphenhydrAMINE  (BENADRYL ) injection 50 mg (50 mg Intravenous Given 05/29/23 4010)    ED Course/ Medical Decision Making/ A&P                                 Medical Decision Making Amount and/or Complexity of Data Reviewed Labs: ordered. Radiology: ordered.  Risk Prescription drug management.  AMARO OUTEN is a 48 y.o. male with a past medical history significant for GERD, anxiety, depression, previous pneumonia, previous appendectomy, previous gastric banding, and documented diet-controlled diabetes who presents with subjective chills, nausea, diarrhea, cough, chest tightness, shortness of breath, abdominal cramping, and headache.  According to patient, he is at all the symptoms since yesterday that have been worsening.  He is not reporting focal neck pain or neck stiffness but is feeling malaise and myalgias all over.  He is unsure of sick contacts but reportedly goes into apartments and is unsure if he has been around anybody was been sick.  He denies any trauma.  On exam, lungs were clear.  Chest nontender.  Abdomen not significantly tender.  Back and flanks had mild soreness on exam.  No rash seen.  No focal neurologic deficits.  Pupils symmetric and reactive with normal extract movements.  Dry mucous membranes in his mouth.  Tachycardia present on evaluation.  He is afebrile.  Patient moving his neck around on exam.  Clinically I suspect viral illness, will check for COVID, flu, RSV.  Given his tachycardia, reported shortness of breath chest tightness and cough with production, will get an x-ray to rule out pneumonia.  Will  get some screening labs given the reported nausea and diarrhea for the last 48 hours to rule out significant dehydration and his dry mucous membranes or electrolytes needing repletion.  We will check urinalysis given his decreased urine.  Given his lack of focal neck pain or neck stiffness or fevers here have low suspicion for meningitis so do not think he needs LP at this time.  Anticipate reassessment after workup to determine disposition.  1:22 PM Workup revealed patient does have flu A is a likely cause of all the symptoms.  He started feeling better after medications.  Patient was able to tolerate some fluids.  Given otherwise reassuring workup and no evidence of pneumonia, will discharge with return for nausea medicine and Tamiflu .  He will follow-up with PCP.  Understood return precautions and follow-up instructions and patient was discharged in good condition with improving symptoms.        Final Clinical Impression(s) / ED Diagnoses Final diagnoses:  Influenza A  Acute cough  Malaise and fatigue  Nausea    Rx / DC Orders ED Discharge Orders          Ordered    ondansetron  (ZOFRAN -ODT) 4 MG disintegrating tablet  Every 8 hours PRN        05/29/23 1324    oseltamivir  (TAMIFLU ) 75 MG capsule  Every 12 hours        05/29/23 1324            Clinical Impression: 1. Influenza A   2. Acute cough   3. Malaise and fatigue   4. Nausea     Disposition: Discharge  Condition: Good  I have discussed the results, Dx and Tx plan with the pt(& family if present). He/she/they expressed understanding and agree(s) with the plan. Discharge instructions discussed at great length. Strict return precautions discussed and pt &/or family have verbalized understanding of the instructions. No further questions at time of discharge.    New Prescriptions   ONDANSETRON  (ZOFRAN -ODT) 4 MG DISINTEGRATING TABLET    Take 1 tablet (4 mg total) by mouth every 8 (eight) hours as needed for  nausea or vomiting.   OSELTAMIVIR  (TAMIFLU ) 75 MG CAPSULE    Take 1 capsule (75 mg total) by mouth every 12 (twelve)  hours.    Follow Up: Little Riff, MD [address] [facility] [place] [address] [phone]     [facility] Emergency Department at [facility] [address] [place]  [id number] [phone]       Bailie Christenbury, Marine Sia, MD 05/29/23 1326

## 2023-05-29 NOTE — ED Triage Notes (Signed)
 Pt c/o CP shob, fever with HA and lower ABD pain with nausea and diarrhea starting yesterday

## 2023-05-29 NOTE — ED Notes (Signed)
 Per EDP order, pt given fluids and/or food for PO challenge. Pt verbalized understanding to utilize call bell if nausea or emesis occur.

## 2023-07-22 ENCOUNTER — Ambulatory Visit
Admission: EM | Admit: 2023-07-22 | Discharge: 2023-07-22 | Disposition: A | Discharge status: Home / Self Care | Payer: Self-pay

## 2023-07-22 ENCOUNTER — Emergency Department (HOSPITAL_BASED_OUTPATIENT_CLINIC_OR_DEPARTMENT_OTHER): Admission: EM | Admit: 2023-07-22 | Discharge: 2023-07-22 | Discharge status: Against Medical Advice | Payer: Self-pay

## 2023-07-22 ENCOUNTER — Emergency Department (HOSPITAL_BASED_OUTPATIENT_CLINIC_OR_DEPARTMENT_OTHER)
Admission: EM | Admit: 2023-07-22 | Discharge: 2023-07-23 | Disposition: A | Discharge status: Home / Self Care | Payer: Self-pay | Attending: Emergency Medicine | Admitting: Emergency Medicine

## 2023-07-22 ENCOUNTER — Emergency Department (HOSPITAL_BASED_OUTPATIENT_CLINIC_OR_DEPARTMENT_OTHER): Payer: Self-pay

## 2023-07-22 ENCOUNTER — Encounter (HOSPITAL_BASED_OUTPATIENT_CLINIC_OR_DEPARTMENT_OTHER): Payer: Self-pay | Admitting: Emergency Medicine

## 2023-07-22 ENCOUNTER — Other Ambulatory Visit: Payer: Self-pay

## 2023-07-22 DIAGNOSIS — K209 Esophagitis, unspecified without bleeding: Secondary | ICD-10-CM | POA: Insufficient documentation

## 2023-07-22 DIAGNOSIS — D72829 Elevated white blood cell count, unspecified: Secondary | ICD-10-CM | POA: Insufficient documentation

## 2023-07-22 DIAGNOSIS — I10 Essential (primary) hypertension: Secondary | ICD-10-CM | POA: Insufficient documentation

## 2023-07-22 DIAGNOSIS — D75839 Thrombocytosis, unspecified: Secondary | ICD-10-CM | POA: Insufficient documentation

## 2023-07-22 DIAGNOSIS — R109 Unspecified abdominal pain: Secondary | ICD-10-CM

## 2023-07-22 DIAGNOSIS — K449 Diaphragmatic hernia without obstruction or gangrene: Secondary | ICD-10-CM | POA: Insufficient documentation

## 2023-07-22 DIAGNOSIS — F17220 Nicotine dependence, chewing tobacco, uncomplicated: Secondary | ICD-10-CM | POA: Insufficient documentation

## 2023-07-22 LAB — CBC
HCT: 42.7 % (ref 39.0–52.0)
Hemoglobin: 14.8 g/dL (ref 13.0–17.0)
MCH: 29.5 pg (ref 26.0–34.0)
MCHC: 34.7 g/dL (ref 30.0–36.0)
MCV: 85.2 fL (ref 80.0–100.0)
Platelets: 420 10*3/uL — ABNORMAL HIGH (ref 150–400)
RBC: 5.01 MIL/uL (ref 4.22–5.81)
RDW: 14.2 % (ref 11.5–15.5)
WBC: 12 10*3/uL — ABNORMAL HIGH (ref 4.0–10.5)
nRBC: 0 % (ref 0.0–0.2)

## 2023-07-22 LAB — COMPREHENSIVE METABOLIC PANEL
ALT: 20 U/L (ref 0–44)
AST: 33 U/L (ref 15–41)
Albumin: 5 g/dL (ref 3.5–5.0)
Alkaline Phosphatase: 80 U/L (ref 38–126)
Anion gap: 14 (ref 5–15)
BUN: 19 mg/dL (ref 6–20)
CO2: 22 mmol/L (ref 22–32)
Calcium: 10.7 mg/dL — ABNORMAL HIGH (ref 8.9–10.3)
Chloride: 99 mmol/L (ref 98–111)
Creatinine, Ser: 1.44 mg/dL — ABNORMAL HIGH (ref 0.61–1.24)
GFR, Estimated: 60 mL/min — ABNORMAL LOW (ref >60)
Glucose, Bld: 96 mg/dL (ref 70–99)
Potassium: 3.6 mmol/L (ref 3.5–5.1)
Sodium: 135 mmol/L (ref 135–145)
Total Bilirubin: 2.2 mg/dL — ABNORMAL HIGH (ref 0.0–1.2)
Total Protein: 8.9 g/dL — ABNORMAL HIGH (ref 6.5–8.1)

## 2023-07-22 LAB — LIPASE, BLOOD: Lipase: 14 U/L (ref 11–51)

## 2023-07-22 MED ORDER — IOHEXOL 300 MG/ML  SOLN
100.0000 mL | Freq: Once | INTRAMUSCULAR | Status: AC | PRN
  Administered 2023-07-22: 100 mL via INTRAVENOUS

## 2023-07-22 MED ORDER — PANTOPRAZOLE SODIUM 40 MG PO TBEC
40.0000 mg | DELAYED_RELEASE_TABLET | Freq: Once | ORAL | Status: AC
  Administered 2023-07-23: 40 mg via ORAL
  Filled 2023-07-22: qty 1

## 2023-07-22 MED ORDER — SUCRALFATE 1 G PO TABS
1.0000 g | ORAL_TABLET | Freq: Once | ORAL | Status: AC
  Administered 2023-07-23: 1 g via ORAL
  Filled 2023-07-22: qty 1

## 2023-07-22 MED ORDER — MORPHINE SULFATE (PF) 4 MG/ML IV SOLN
4.0000 mg | Freq: Once | INTRAVENOUS | Status: AC
  Administered 2023-07-22: 4 mg via INTRAVENOUS
  Filled 2023-07-22: qty 1

## 2023-07-22 MED ORDER — ONDANSETRON 4 MG PO TBDP
4.0000 mg | ORAL_TABLET | Freq: Three times a day (TID) | ORAL | 0 refills | Status: AC | PRN
  Filled 2023-07-22: qty 18, 21d supply, fill #0

## 2023-07-22 MED ORDER — PANTOPRAZOLE SODIUM 40 MG PO TBEC
40.0000 mg | DELAYED_RELEASE_TABLET | Freq: Every day | ORAL | 0 refills | Status: AC
  Filled 2023-07-22: qty 30, 30d supply, fill #0

## 2023-07-22 MED ORDER — ONDANSETRON HCL 4 MG/2ML IJ SOLN
4.0000 mg | Freq: Once | INTRAMUSCULAR | Status: AC
  Administered 2023-07-22: 4 mg via INTRAVENOUS
  Filled 2023-07-22: qty 2

## 2023-07-22 MED ORDER — DICYCLOMINE HCL 20 MG PO TABS
20.0000 mg | ORAL_TABLET | Freq: Two times a day (BID) | ORAL | 0 refills | Status: AC | PRN
  Filled 2023-07-22: qty 20, 10d supply, fill #0

## 2023-07-22 NOTE — ED Notes (Signed)
 Pt had checked into UC today and this is the 2nd time he has come here today.  He has LWBS, pt brought straight back to a room, family present at bedside.   Pt states abd. Pain started last night, more on left side, states he has never had pain like this before.   Pt is freezing and then will break out into a sweat r/t pain.  Rates pain 10/10 Denies any etoh or drug abuse

## 2023-07-22 NOTE — Discharge Instructions (Addendum)
 As discussed, CT scan did show significant inflammatory changes of your esophagus which is most likely causing your symptoms.  Will place you back on reflux medicine to take daily.  Will also send in medicine for nausea and abdominal discomfort.  Avoid NSAIDs as these medications will make symptoms worse; see dietary/lifestyle changes as described in the information packet attached to discharge papers.  Recommend follow-up with GI in the outpatient setting for reassessment.  Please do not hesitate to return if the worrisome signs and symptoms we discussed become apparent.

## 2023-07-22 NOTE — ED Notes (Signed)
 Pt d/c from department after no answer to be triaged at 1644. Pt still in department and states never left.

## 2023-07-22 NOTE — ED Provider Notes (Signed)
 [place] EMERGENCY DEPARTMENT AT [facility] Provider Note   CSN: [id number] Arrival date & time: 07/22/23  1925     History  Chief Complaint  Patient presents with   Abdominal Pain    Eric [name] is a 48 y.o. male.   Abdominal Pain   48 year old male presents emergency department with complaints of lower abdominal pain.  Reports most pain in left lower abdomen.  States the pain began suddenly yesterday evening when he was sitting at his house.  Reports pain constant since onset intermittently worsening without known exacerbating factor.  States that he finds a little relief in the fetal position on his left side but otherwise, no relieving factors.  Took Motrin without improvement of symptoms.  Reports nausea without emesis.  Denies any fevers, chills, urinary symptoms, change in bowel habits.  Last bowel movement this morning and regular per patient.  Past medical history significant for GERD, depression, laparoscopic gastric banding, appendectomy  Home Medications Prior to Admission medications   Medication Sig Start Date End Date Taking? Authorizing Provider  dicyclomine (BENTYL) 20 MG tablet Take 1 tablet (20 mg total) by mouth 2 (two) times daily as needed. 07/22/23  Yes Sherian Maroon A, PA  ondansetron (ZOFRAN-ODT) 4 MG disintegrating tablet Take 1 tablet (4 mg total) by mouth every 8 (eight) hours as needed. 07/22/23  Yes Sherian Maroon A, PA  pantoprazole (PROTONIX) 40 MG tablet Take 1 tablet (40 mg total) by mouth daily. 07/22/23  Yes Sherian Maroon A, PA  Albuterol Sulfate (PROAIR RESPICLICK) 108 (90 Base) MCG/ACT AEPB Inhale 1 puff into the lungs every 6 (six) hours as needed. 08/23/20   Enedina Finner, MD  ibuprofen (ADVIL) 200 MG tablet Take 200 mg by mouth every 6 (six) hours as needed for mild pain.    [provider]  oseltamivir (TAMIFLU) 75 MG capsule Take 1 capsule (75 mg total) by mouth every 12 (twelve) hours. 05/29/23   Tegeler, Canary Brim,  MD  venlafaxine XR (EFFEXOR-XR) 75 MG 24 hr capsule Take 2 capsules (150 mg total) by mouth daily. 10/02/19   Phineas Semen, MD  zolpidem (AMBIEN) 10 MG tablet Take 10 mg by mouth at bedtime as needed for sleep.    [provider]      Allergies    Patient has no known allergies.    Review of Systems   Review of Systems  Gastrointestinal:  Positive for abdominal pain.  All other systems reviewed and are negative.   Physical Exam Updated Vital Signs BP (!) 145/99   Pulse 99   Temp 97.7 F (36.5 C) (Oral)   Resp 20   Ht 5\' 5"  (1.651 m)   Wt 77.1 kg   SpO2 100%   BMI 28.29 kg/m  Physical Exam Vitals and nursing note reviewed.  Constitutional:      General: He is not in acute distress.    Appearance: He is well-developed.  HENT:     Head: Normocephalic and atraumatic.  Eyes:     Conjunctiva/sclera: Conjunctivae normal.  Cardiovascular:     Rate and Rhythm: Normal rate and regular rhythm.     Heart sounds: No murmur heard. Pulmonary:     Effort: Pulmonary effort is normal. No respiratory distress.     Breath sounds: Normal breath sounds.  Abdominal:     Palpations: Abdomen is soft.     Tenderness: There is abdominal tenderness in the epigastric area, left upper quadrant and left lower quadrant. There is no  right CVA tenderness, left CVA tenderness or guarding. Negative signs include Murphy's sign and McBurney's sign.  Musculoskeletal:        General: No swelling.     Cervical back: Neck supple.  Skin:    General: Skin is warm and dry.     Capillary Refill: Capillary refill takes less than 2 seconds.  Neurological:     Mental Status: He is alert.  Psychiatric:        Mood and Affect: Mood normal.     ED Results / Procedures / Treatments   Labs (all labs ordered are listed, but only abnormal results are displayed) Labs Reviewed  COMPREHENSIVE METABOLIC PANEL - Abnormal; Notable for the following components:      Result Value   Creatinine, Ser 1.44  (*)    Calcium 10.7 (*)    Total Protein 8.9 (*)    Total Bilirubin 2.2 (*)    GFR, Estimated 60 (*)    All other components within normal limits  CBC - Abnormal; Notable for the following components:   WBC 12.0 (*)    Platelets 420 (*)    All other components within normal limits  LIPASE, BLOOD  URINALYSIS, ROUTINE W REFLEX MICROSCOPIC    EKG None  Radiology CT ABDOMEN PELVIS W CONTRAST Result Date: 07/22/2023 CLINICAL DATA:  Left lower quadrant pain. EXAM: CT ABDOMEN AND PELVIS WITH CONTRAST TECHNIQUE: Multidetector CT imaging of the abdomen and pelvis was performed using the standard protocol following bolus administration of intravenous contrast. RADIATION DOSE REDUCTION: This exam was performed according to the departmental dose-optimization program which includes automated exposure control, adjustment of the mA and/or kV according to patient size and/or use of iterative reconstruction technique. CONTRAST:  OMNIPAQUE IOHEXOL 300 MG/ML  SOLN COMPARISON:  03/10/2022 FINDINGS: Lower chest: No basilar airspace disease or pleural effusion. Hepatobiliary: No focal liver abnormality is seen. No gallstones, gallbladder wall thickening, or biliary dilatation. Pancreas: No ductal dilatation or inflammation. Spleen: Normal in size without focal abnormality. Adrenals/Urinary Tract: Normal adrenal glands. No hydronephrosis, renal calculi or renal inflammation. No suspicious renal lesion. Unremarkable urinary bladder. Stomach/Bowel: Gastric band in place. Small hiatal hernia with wall thickening of the distal esophagus. There is no small bowel obstruction or inflammation. Small to moderate colonic stool burden. No colonic inflammation. The appendix is not visualized. Vascular/Lymphatic: No acute vascular findings. Normal caliber abdominal aorta. The portal vein is patent. Neural retroaortic left renal vein. No abdominopelvic adenopathy. Reproductive: Prostate is unremarkable. Other: No free air, free  fluid, or intra-abdominal fluid collection. Musculoskeletal: There are no acute or suspicious osseous abnormalities. IMPRESSION: 1. No acute abnormality or explanation for left lower quadrant pain. 2. Gastric band in place. Small hiatal hernia with wall thickening of the distal esophagus, may be due to reflux or esophagitis. Electronically Signed   By: Narda Rutherford M.D.   On: 07/22/2023 23:16    Procedures Procedures    Medications Ordered in ED Medications  pantoprazole (PROTONIX) EC tablet 40 mg (has no administration in time range)  sucralfate (CARAFATE) tablet 1 g (has no administration in time range)  morphine (PF) 4 MG/ML injection 4 mg (4 mg Intravenous Given 07/22/23 2010)  ondansetron (ZOFRAN) injection 4 mg (4 mg Intravenous Given 07/22/23 2010)  iohexol (OMNIPAQUE) 300 MG/ML solution 100 mL (100 mLs Intravenous Contrast Given 07/22/23 2047)    ED Course/ Medical Decision Making/ A&P  Medical Decision Making Amount and/or Complexity of Data Reviewed Labs: ordered. Radiology: ordered.  Risk Prescription drug management.   This patient presents to the ED for concern of abdominal pain, this involves an extensive number of treatment options, and is a complaint that carries with it a high risk of complications and morbidity.  The differential diagnosis includes difficult ileus, appendicitis, nephrolithiasis, pyelonephritis, SBO/LBO, volvulus, other   Co morbidities that complicate the patient evaluation  See HPI   Additional history obtained:  Additional history obtained from EMR External records from outside source obtained and reviewed including hospital records   Lab Tests:  I Ordered, and personally interpreted labs.  The pertinent results include: No electrolyte abnormalities besides slight hypercalcemia of 10.7.  Patient with baseline renal function creatinine 1.44, BUN of 19 GFR of 60.  No transaminitis.  Leukocytosis of 12.   Platelets elevated at 420.  Hemoglobin within normal limits.  Lipase within normal limits.   Imaging Studies ordered:  I ordered imaging studies including CT abdomen pelvis I independently visualized and interpreted imaging which showed  Gastric band in place.  Small hiatal hernia with wall thickening of distal esophagus. I agree with the radiologist interpretation  Cardiac Monitoring: / EKG:  The patient was maintained on a cardiac monitor.  I personally viewed and interpreted the cardiac monitored which showed an underlying rhythm of: Sinus rhythm   Consultations Obtained:  N/a   Problem List / ED Course / Critical interventions / Medication management  Esophagitis, abdominal pain, hiatal hernia I ordered medication including morphine, Zofran   Reevaluation of the patient after these medicines showed that the patient improved I have reviewed the patients home medicines and have made adjustments as needed   Social Determinants of Health:  Chews tobacco.  Vapes.  Denies illicit drug use.   Test / Admission - Considered:  Esophagitis, abdominal pain, hiatal hernia Vitals signs significant for hyper tension blood pressure 139 systolic. Otherwise within normal range and stable throughout visit. Laboratory/imaging studies significant for: See above 49 year old male presents emergency department with complaints of lower abdominal pain.  Reports most pain in left lower abdomen.  States the pain began suddenly yesterday evening when he was sitting at his house.  Reports pain constant since onset intermittently worsening without known exacerbating factor.  States that he finds a little relief in the fetal position on his left side but otherwise, no relieving factors.  Took Motrin without improvement of symptoms.  Reports nausea without emesis.  Denies any fevers, chills, urinary symptoms, change in bowel habits.  Last bowel movement this morning and regular per patient. On exam, patient  with tenderness left-sided abdomen as well as epigastric region.  Patient curled up on his side as he states this is only position that relieves his symptoms.  Laboratory studies concerning for slight leukocytosis of 12.  Otherwise, renal function near baseline.  CT imaging concerning for esophagitis, hiatal hernia.  Treated with antiemetic, GI cocktail and did note improvement of symptoms and subsequent able to tolerate p.o.  Will plan to place patient on a PPI, recommend dietary changes/lifestyle modifications regarding GERD/reflux/esophagitis and follow-up with GI in the outpatient setting.  Treatment plan discussed at length with patient and he acknowledged understanding was agreeable to said plan.  Patient overall well-appearing, afebrile in no acute distress. Worrisome signs and symptoms were discussed with the patient, and the patient acknowledged understanding to return to the ED if noticed. Patient was stable upon discharge.  Final Clinical Impression(s) / ED Diagnoses Final diagnoses:  Esophagitis  Abdominal pain, unspecified abdominal location  Hiatal hernia    Rx / DC Orders ED Discharge Orders          Ordered    pantoprazole (PROTONIX) 40 MG tablet  Daily        07/22/23 2332    dicyclomine (BENTYL) 20 MG tablet  2 times daily PRN        07/22/23 2332    ondansetron (ZOFRAN-ODT) 4 MG disintegrating tablet  Every 8 hours PRN        07/22/23 2332              Peter Garter, [place] 07/22/23 2338    Derwood Kaplan, MD 07/23/23 2334

## 2023-07-22 NOTE — ED Triage Notes (Signed)
 Lower abd pain bilateral. Diaphoretic. +N/-V/-D. Denies urinary symptoms. Started last night. SOB- attributes to pain.

## 2023-07-22 NOTE — ED Triage Notes (Signed)
 Pt reports he has abdominal pain, shob, body aches, sweats  x 1 day

## 2023-07-22 NOTE — ED Provider Notes (Signed)
 Patient left frantically without being seen at triage. Time in department was 00:13.   Wallis Bamberg, [place] 07/22/23 1540

## 2023-07-22 NOTE — ED Notes (Signed)
 Pt tolerated PO challenge

## 2023-07-22 NOTE — ED Notes (Signed)
 Patient called greater than 3 times with no answer.  All areas of lobby and restroom searched.

## 2023-07-23 ENCOUNTER — Other Ambulatory Visit (HOSPITAL_BASED_OUTPATIENT_CLINIC_OR_DEPARTMENT_OTHER): Payer: Self-pay

## 2024-01-25 ENCOUNTER — Encounter (HOSPITAL_BASED_OUTPATIENT_CLINIC_OR_DEPARTMENT_OTHER): Payer: Self-pay

## 2024-01-25 ENCOUNTER — Other Ambulatory Visit: Payer: Self-pay

## 2024-01-25 ENCOUNTER — Emergency Department (HOSPITAL_BASED_OUTPATIENT_CLINIC_OR_DEPARTMENT_OTHER)
Admission: EM | Admit: 2024-01-25 | Discharge: 2024-01-25 | Disposition: A | Discharge status: Home / Self Care | Payer: Self-pay | Attending: Emergency Medicine | Admitting: Emergency Medicine

## 2024-01-25 DIAGNOSIS — K146 Glossodynia: Secondary | ICD-10-CM

## 2024-01-25 DIAGNOSIS — Z87891 Personal history of nicotine dependence: Secondary | ICD-10-CM | POA: Insufficient documentation

## 2024-01-25 DIAGNOSIS — R059 Cough, unspecified: Secondary | ICD-10-CM | POA: Insufficient documentation

## 2024-01-25 DIAGNOSIS — R051 Acute cough: Secondary | ICD-10-CM

## 2024-01-25 DIAGNOSIS — H9201 Otalgia, right ear: Secondary | ICD-10-CM | POA: Insufficient documentation

## 2024-01-25 DIAGNOSIS — J029 Acute pharyngitis, unspecified: Secondary | ICD-10-CM | POA: Insufficient documentation

## 2024-01-25 DIAGNOSIS — Z8616 Personal history of COVID-19: Secondary | ICD-10-CM | POA: Insufficient documentation

## 2024-01-25 LAB — RESP PANEL BY RT-PCR (RSV, FLU A&B, COVID)  RVPGX2
Influenza A by PCR: NEGATIVE
Influenza B by PCR: NEGATIVE
Resp Syncytial Virus by PCR: NEGATIVE
SARS Coronavirus 2 by RT PCR: NEGATIVE

## 2024-01-25 LAB — GROUP A STREP BY PCR: Group A Strep by PCR: NOT DETECTED

## 2024-01-25 MED ORDER — AMOXICILLIN 500 MG PO CAPS
500.0000 mg | ORAL_CAPSULE | Freq: Once | ORAL | Status: AC
  Administered 2024-01-25: 500 mg via ORAL
  Filled 2024-01-25: qty 1

## 2024-01-25 MED ORDER — AMOXICILLIN 500 MG PO CAPS: 500.0000 mg | ORAL_CAPSULE | Freq: Two times a day (BID) | ORAL | 0 refills | Status: AC

## 2024-01-25 MED ORDER — CHLORHEXIDINE GLUCONATE 0.12 % MT SOLN: 15.0000 mL | Freq: Two times a day (BID) | OROMUCOSAL | 0 refills | Status: AC

## 2024-01-25 MED ORDER — CELECOXIB 200 MG PO CAPS: 200.0000 mg | ORAL_CAPSULE | Freq: Two times a day (BID) | ORAL | 0 refills | Status: AC | PRN

## 2024-01-25 NOTE — ED Provider Notes (Signed)
 [place] EMERGENCY DEPARTMENT AT [facility] Provider Note   CSN: [id number] Arrival date & time: 01/25/24  1542     Patient presents with: Tongue Issue, Sore Throat, and Facial Pain   Eric [name] is a 48 y.o. male.    Sore Throat   48 year old male presents emergency department couple different complaints.  States that he recently got denture implants put in in his lower teeth a couple weeks ago.  Not long after using the implants, developed sore to the right side of his tongue.  States that he feels like the part of his implant is rubbing on that part of the tongue.  States he has been using his implants mainly at night due to inability to tolerate it during the day.  States that he feels like he is biting on that area as well.  Consulted the provider who formed to the implants who recommended getting a little bit more time to adjust before altering the implant at all.  Patient is noted no improvement over the past 2 weeks.  States that he feels like there is a white spot on his right part of the tongue where the area of irritation is.  States for the past 2 days, has developed right sided ear pain, mild sore throat, cough.  Denies any fevers, chills, chest pain, shortness of breath, abdominal pain, nausea, vomiting.  Denies any known illness exposures.  Would like to be evaluated for this as well.  Past medical history significant for COVID, GERD, depression, anxiety, AKI  Prior to Admission medications   Medication Sig Start Date End Date Taking? Authorizing Provider  Albuterol  Sulfate (PROAIR  RESPICLICK) 108 (90 Base) MCG/ACT AEPB Inhale 1 puff into the lungs every 6 (six) hours as needed. 08/23/20   Patel, Sona, MD  dicyclomine  (BENTYL ) 20 MG tablet Take 1 tablet (20 mg total) by mouth 2 (two) times daily as needed. 07/22/23   Silver Wonda LABOR, PA  ibuprofen  (ADVIL ) 200 MG tablet Take 200 mg by mouth every 6 (six) hours as needed for mild pain.    [provider]   ondansetron  (ZOFRAN -ODT) 4 MG disintegrating tablet Take 1 tablet (4 mg total) by mouth every 8 (eight) hours as needed. 07/22/23   Silver Wonda LABOR, PA  oseltamivir  (TAMIFLU ) 75 MG capsule Take 1 capsule (75 mg total) by mouth every 12 (twelve) hours. 05/29/23   Tegeler, Lonni PARAS, MD  pantoprazole  (PROTONIX ) 40 MG tablet Take 1 tablet (40 mg total) by mouth daily. 07/22/23   Silver Wonda LABOR, PA  venlafaxine  XR (EFFEXOR -XR) 75 MG 24 hr capsule Take 2 capsules (150 mg total) by mouth daily. 10/02/19   Goodman, Graydon, MD  zolpidem  (AMBIEN ) 10 MG tablet Take 10 mg by mouth at bedtime as needed for sleep.    [provider]    Allergies: Patient has no known allergies.    Review of Systems  All other systems reviewed and are negative.   Updated Vital Signs BP (!) 117/94   Pulse 89   Temp 98.7 F (37.1 C) (Oral)   Resp 20   Ht 5' 5 (1.651 m)   Wt 77.1 kg   SpO2 99%   BMI 28.29 kg/m   Physical Exam Vitals and nursing note reviewed.  Constitutional:      General: He is not in acute distress.    Appearance: He is well-developed.  HENT:     Head: Normocephalic and atraumatic.     Ears:  Comments: Erythematous it.  Right-sided TM with purulent fluid level about inferior  25% TM.    Mouth/Throat:     Comments: Mild posterior pharyngeal erythema.  Uvula midline rises symmetrical phonation.  No sublingual extremity Bitler swelling.  No trismus.  Tonsils 1+ bilaterally without exudate.  1.0 cm area of white patch on the right lateral aspect of tongue with surrounding erythema.  No expressible drainage.  Area very tender to touch.  Not able to scrape off the white patch. Eyes:     Conjunctiva/sclera: Conjunctivae normal.  Cardiovascular:     Rate and Rhythm: Normal rate and regular rhythm.     Heart sounds: No murmur heard. Pulmonary:     Effort: Pulmonary effort is normal. No respiratory distress.     Breath sounds: Normal breath sounds. No wheezing, rhonchi or  rales.  Abdominal:     Palpations: Abdomen is soft.     Tenderness: There is no abdominal tenderness.  Musculoskeletal:        General: No swelling.     Cervical back: Neck supple.  Skin:    General: Skin is warm and dry.     Capillary Refill: Capillary refill takes less than 2 seconds.  Neurological:     Mental Status: He is alert.  Psychiatric:        Mood and Affect: Mood normal.     (all labs ordered are listed, but only abnormal results are displayed) Labs Reviewed  GROUP A STREP BY PCR  RESP PANEL BY RT-PCR (RSV, FLU A&B, COVID)  [id number]    EKG: None  Radiology: No results found.   Procedures   Medications Ordered in the ED  amoxicillin  (AMOXIL ) capsule 500 mg (500 mg Oral Given 01/25/24 1731)                                    Medical Decision Making Risk Prescription drug management.   This patient presents to the ED for concern of tongue pain, cough, sore throat, ear pain, this involves an extensive number of treatment options, and is a complaint that carries with it a high risk of complications and morbidity.  The differential diagnosis includes, flu, RSV, tongue cancer, COVID, pneumonia, strep pharyngitis, other   Co morbidities that complicate the patient evaluation  See HPI   Additional history obtained:  Additional history obtained from EMR External records from outside source obtained and reviewed including hospital records   Lab Tests:  I Ordered, and personally interpreted labs.  The pertinent results include: Group A strep negative.  Viral testing negative   Imaging Studies ordered:  N/a   Cardiac Monitoring: / EKG:  N/a   Consultations Obtained:  N/a   Problem List / ED Course / Critical interventions / Medication management  Tongue pain, viral URI I ordered medication including amoxicillin    Reevaluation of the patient after these medicines showed that the patient stayed the same I have reviewed the patients home  medicines and have made adjustments as needed   Social Determinants of Health:  Former cigarette use.  Denies illicit drug use.   Test / Admission - Considered:  Tongue pain, viral URI, right ear pain Vitals signs within normal range and stable throughout visit. Laboratory studies significant for: See above 48 year old male presents emergency department couple different complaints.  States that he recently got denture implants put in in his lower teeth a couple weeks ago.  Not long after using the implants, developed sore to the right side of his tongue.  States that he feels like the part of his implant is rubbing on that part of the tongue.  States he has been using his implants mainly at night due to inability to tolerate it during the day.  States that he feels like he is biting on that area as well.  Consulted the provider who formed to the implants who recommended getting a little bit more time to adjust before altering the implant at all.  Patient is noted no improvement over the past 2 weeks.  States that he feels like there is a white spot on his right part of the tongue where the area of irritation is.  States for the past 2 days, has developed right sided ear pain, mild sore throat, cough.  Denies any fevers, chills, chest pain, shortness of breath, abdominal pain, nausea, vomiting.  Denies any known illness exposures.  Would like to be evaluated for this as well. On exam, mild posterior pharyngeal erythema.  Erythematous right-sided TM with purulent fluid level.  Lungs clear to oscillation bilaterally.  Abdomen nontender.  Regarding cough, sore throat and the ear pain, suspect viral URI.  Patient does have clinical evidence of right-sided otitis media.  Will place patient on antibiotics for this.  Regarding right tongue pain, area seems to be from where his dental implants had and rubbing into the right side of his tongue.  No evidence clinically of abscess.  No evidence clinically of  Ludwig angina, PTA.  Will recommend follow-up with dental specialist to have the inserts altered to decrease likelihood of irritation of the tongue.  Will send in Peridex  mouthwash as well as recommend anti-inflammatories for pain.  Treatment plan discussed with patient he acknowledged understanding was agreeable.  Patient well-appearing, afebrile in no acute distress. Worrisome signs and symptoms were discussed with the patient, and the patient acknowledged understanding to return to the ED if noticed. Patient was stable upon discharge.       Final diagnoses:  None    ED Discharge Orders     None          Silver Wonda LABOR, [place] 01/25/24 1818    Yolande Lamar BROCKS, MD 01/26/24 [phone]

## 2024-01-25 NOTE — Discharge Instructions (Addendum)
 As discussed, your viral testing was negative.  Group A strep negative.  Will push antibiotics given concern for right ear infection.  Will send you home with a mouthwash to help with oral hygiene.  Recommend over-the-counter Orajel to help with the tongue discomfort.  Please follow-up with the implant specialist to have your implants adjusted as I think this is most likely causing your tongue irritation.  Will send in medication to help with pain in the back of your throat as well as your tongue.  Recommend follow-up with your primary care otherwise for reassessment.

## 2024-01-25 NOTE — ED Notes (Signed)
 RN reviewed discharge instructions with pt. Pt verbalized understanding and had no further questions. VSS upon discharge.

## 2024-01-25 NOTE — ED Triage Notes (Signed)
 Arrives POV with complaints of developing a sore to the right side of his tongue x2 weeks. Also reports right side facial pain going into his ear with some blurred vision. Reports a sore throat as well.  Recently had dental implants placed.

## 2024-01-25 NOTE — ED Notes (Signed)
 Pt called by this RN in an attempt to change the pharmacy for antibiotics, left voicemail.

## 2024-03-13 ENCOUNTER — Other Ambulatory Visit: Payer: Self-pay

## 2024-03-13 ENCOUNTER — Other Ambulatory Visit (HOSPITAL_BASED_OUTPATIENT_CLINIC_OR_DEPARTMENT_OTHER): Payer: Self-pay

## 2024-03-13 MED ORDER — VENLAFAXINE HCL ER 75 MG PO CP24
225.0000 mg | ORAL_CAPSULE | Freq: Every day | ORAL | 3 refills | Status: AC
  Filled 2024-03-13: qty 90, 30d supply, fill #0
  Filled 2024-06-12: qty 90, 30d supply, fill #1

## 2024-03-13 MED ORDER — BUSPIRONE HCL 5 MG PO TABS
5.0000 mg | ORAL_TABLET | Freq: Two times a day (BID) | ORAL | 3 refills | Status: AC
  Filled 2024-03-13: qty 180, 90d supply, fill #0

## 2024-03-13 MED ORDER — PANTOPRAZOLE SODIUM 40 MG PO TBEC
40.0000 mg | DELAYED_RELEASE_TABLET | Freq: Every day | ORAL | 3 refills | Status: AC
  Filled 2024-03-13: qty 90, 90d supply, fill #0
  Filled 2024-06-12: qty 90, 90d supply, fill #1

## 2024-03-13 MED ORDER — TRAMADOL HCL 50 MG PO TABS
50.0000 mg | ORAL_TABLET | Freq: Every evening | ORAL | 0 refills | Status: AC
  Filled 2024-03-13: qty 14, 14d supply, fill #0

## 2024-03-13 MED ORDER — ZOLPIDEM TARTRATE 5 MG PO TABS
5.0000 mg | ORAL_TABLET | Freq: Every day | ORAL | 5 refills | Status: DC
  Filled 2024-03-13: qty 30, 30d supply, fill #0
  Filled 2024-04-14: qty 30, 30d supply, fill #1

## 2024-03-24 ENCOUNTER — Other Ambulatory Visit (HOSPITAL_BASED_OUTPATIENT_CLINIC_OR_DEPARTMENT_OTHER): Payer: Self-pay

## 2024-03-24 ENCOUNTER — Encounter (HOSPITAL_BASED_OUTPATIENT_CLINIC_OR_DEPARTMENT_OTHER): Payer: Self-pay

## 2024-03-24 MED ORDER — NYSTATIN 100000 UNIT/ML MT SUSP
5.0000 mL | Freq: Four times a day (QID) | OROMUCOSAL | 1 refills | Status: AC
  Filled 2024-03-24: qty 240, 12d supply, fill #0

## 2024-04-12 ENCOUNTER — Encounter (HOSPITAL_BASED_OUTPATIENT_CLINIC_OR_DEPARTMENT_OTHER): Payer: Self-pay | Admitting: Emergency Medicine

## 2024-04-12 ENCOUNTER — Emergency Department (HOSPITAL_BASED_OUTPATIENT_CLINIC_OR_DEPARTMENT_OTHER): Payer: Self-pay | Admitting: Radiology

## 2024-04-12 ENCOUNTER — Emergency Department (HOSPITAL_BASED_OUTPATIENT_CLINIC_OR_DEPARTMENT_OTHER)
Admission: EM | Admit: 2024-04-12 | Discharge: 2024-04-12 | Disposition: A | Discharge status: Home / Self Care | Payer: Self-pay | Attending: Emergency Medicine | Admitting: Emergency Medicine

## 2024-04-12 DIAGNOSIS — H66011 Acute suppurative otitis media with spontaneous rupture of ear drum, right ear: Secondary | ICD-10-CM | POA: Insufficient documentation

## 2024-04-12 DIAGNOSIS — Z79899 Other long term (current) drug therapy: Secondary | ICD-10-CM | POA: Insufficient documentation

## 2024-04-12 LAB — RESP PANEL BY RT-PCR (RSV, FLU A&B, COVID)  RVPGX2
Influenza A by PCR: NEGATIVE
Influenza B by PCR: NEGATIVE
Resp Syncytial Virus by PCR: NEGATIVE
SARS Coronavirus 2 by RT PCR: NEGATIVE

## 2024-04-12 MED ORDER — OFLOXACIN 0.3 % OT SOLN: 10.0000 [drp] | Freq: Two times a day (BID) | OTIC | 0 refills | Status: AC

## 2024-04-12 MED ORDER — OXYCODONE-ACETAMINOPHEN 5-325 MG PO TABS
1.0000 | ORAL_TABLET | Freq: Once | ORAL | Status: AC
  Administered 2024-04-12: 1 via ORAL
  Filled 2024-04-12: qty 1

## 2024-04-12 MED ORDER — AMOXICILLIN-POT CLAVULANATE 875-125 MG PO TABS
1.0000 | ORAL_TABLET | Freq: Once | ORAL | Status: AC
  Administered 2024-04-12: 1 via ORAL
  Filled 2024-04-12: qty 1

## 2024-04-12 MED ORDER — AMOXICILLIN-POT CLAVULANATE 875-125 MG PO TABS: 1.0000 | ORAL_TABLET | Freq: Two times a day (BID) | ORAL | 0 refills | Status: AC

## 2024-04-12 MED ORDER — OXYCODONE HCL 5 MG PO TABS: 5.0000 mg | ORAL_TABLET | Freq: Four times a day (QID) | ORAL | 0 refills | Status: AC | PRN

## 2024-04-12 NOTE — ED Provider Notes (Signed)
  EMERGENCY DEPARTMENT AT [facility] Provider Note   CSN: [id number] Arrival date & time: 04/12/24  1013     Patient presents with: Eric [name] is a 49 y.o. male.   Patient is a 49 year old male with no significant past medical history presenting to the emergency department with right ear pain.  Patient states that he has been feeling sick for the last couple of days with bodyaches, nausea and some shortness of breath.  He states he initially thought it might just be a viral infection but this morning woke up with severe right ear pain.  He states that he is hearing some swishing in his ear and feels like there is fluid draining but has not seen any fluid drained out.  He states he has not had a fever measured at home.  He denies any known sick contacts.  He denies any significant cough.  The history is provided by the patient and a friend.  Otalgia      Prior to Admission medications   Medication Sig Start Date End Date Taking? Authorizing Provider  amoxicillin -clavulanate (AUGMENTIN ) 875-125 MG tablet Take 1 tablet by mouth every 12 (twelve) hours. 04/12/24  Yes Ellouise, Ivey Nembhard K, DO  ofloxacin (FLOXIN) 0.3 % OTIC solution Place 10 drops into the right ear 2 (two) times daily. 04/12/24  Yes Ellouise, Tanesha Arambula K, DO  oxyCODONE  (ROXICODONE ) 5 MG immediate release tablet Take 1 tablet (5 mg total) by mouth every 6 (six) hours as needed. 04/12/24  Yes Ellouise, Celest Reitz K, DO  Albuterol  Sulfate (PROAIR  RESPICLICK) 108 (90 Base) MCG/ACT AEPB Inhale 1 puff into the lungs every 6 (six) hours as needed. 08/23/20   Patel, Sona, MD  amoxicillin  (AMOXIL ) 500 MG capsule Take 1 capsule (500 mg total) by mouth 2 (two) times daily. 01/25/24   Silver Wonda LABOR, PA  busPIRone  (BUSPAR ) 5 MG tablet Take 1 tablet (5 mg total) by mouth 2 (two) times daily. 03/13/24     celecoxib  (CELEBREX ) 200 MG capsule Take 1 capsule (200 mg total) by mouth 2 (two) times daily as  needed. 01/25/24   Silver Wonda LABOR, PA  chlorhexidine  (PERIDEX ) 0.12 % solution Use as directed 15 mLs in the mouth or throat 2 (two) times daily. 01/25/24   Silver Wonda LABOR, PA  dicyclomine  (BENTYL ) 20 MG tablet Take 1 tablet (20 mg total) by mouth 2 (two) times daily as needed. 07/22/23   Silver Wonda LABOR, PA  ibuprofen  (ADVIL ) 200 MG tablet Take 200 mg by mouth every 6 (six) hours as needed for mild pain.    [provider]  magic mouthwash (nystatin , hydrocortisone , diphenhydrAMINE ) suspension Take 5 mLs by mouth 4 (four) times daily. 03/24/24     ondansetron  (ZOFRAN -ODT) 4 MG disintegrating tablet Take 1 tablet (4 mg total) by mouth every 8 (eight) hours as needed. 07/22/23   Silver Wonda LABOR, PA  oseltamivir  (TAMIFLU ) 75 MG capsule Take 1 capsule (75 mg total) by mouth every 12 (twelve) hours. 05/29/23   Tegeler, Lonni PARAS, MD  pantoprazole  (PROTONIX ) 40 MG tablet Take 1 tablet (40 mg total) by mouth daily. 07/22/23   Silver Wonda LABOR, PA  pantoprazole  (PROTONIX ) 40 MG tablet Take 1 tablet (40 mg total) by mouth daily. 03/13/24     traMADol  (ULTRAM ) 50 MG tablet Take 1 tablet (50 mg total) by mouth nightly as needed for moderate pain (4-6). 03/13/24     venlafaxine  XR (EFFEXOR -XR) 75 MG 24 hr capsule Take 2  capsules (150 mg total) by mouth daily. 10/02/19   Goodman, Graydon, MD  venlafaxine  XR (EFFEXOR -XR) 75 MG 24 hr capsule Take 3 capsules (225 mg total) by mouth daily. 03/13/24     zolpidem  (AMBIEN ) 10 MG tablet Take 10 mg by mouth at bedtime as needed for sleep.    [provider]  zolpidem  (AMBIEN ) 5 MG tablet Take 1 tablet (5 mg total) by mouth nightly as needed for sleep. 03/13/24       Allergies: Patient has no known allergies.    Review of Systems  HENT:  Positive for ear pain.     Updated Vital Signs BP (!) 116/98   Pulse 84   Temp 97.7 F (36.5 C) (Oral)   Resp 18   Wt 75.8 kg   SpO2 99%   BMI 27.79 kg/m   Physical Exam Vitals and nursing note  reviewed.  Constitutional:      General: He is not in acute distress.    Appearance: Normal appearance.  HENT:     Head: Normocephalic and atraumatic.     Right Ear: Ear canal and external ear normal. Tenderness (Pulling on pinnae) present. A middle ear effusion is present. Tympanic membrane is perforated.     Left Ear: Hearing, tympanic membrane, ear canal and external ear normal.     Mouth/Throat:     Mouth: Mucous membranes are moist.     Pharynx: Oropharynx is clear.  Eyes:     Extraocular Movements: Extraocular movements intact.     Conjunctiva/sclera: Conjunctivae normal.  Cardiovascular:     Rate and Rhythm: Normal rate and regular rhythm.     Heart sounds: Normal heart sounds.  Pulmonary:     Effort: Pulmonary effort is normal.     Breath sounds: Normal breath sounds.  Abdominal:     General: Abdomen is flat.     Palpations: Abdomen is soft.     Tenderness: There is no abdominal tenderness.  Musculoskeletal:        General: Normal range of motion.     Cervical back: Normal range of motion.  Skin:    General: Skin is warm and dry.  Neurological:     General: No focal deficit present.     Mental Status: He is alert and oriented to person, place, and time.  Psychiatric:        Mood and Affect: Mood normal.        Behavior: Behavior normal.     (all labs ordered are listed, but only abnormal results are displayed) Labs Reviewed  RESP PANEL BY RT-PCR (RSV, FLU A&B, COVID)  [id number]    EKG: None  Radiology: DG Chest 2 View Result Date: 04/12/2024 CLINICAL DATA:  Short of breath. EXAM: CHEST - 2 VIEW COMPARISON:  05/29/2023.  CT, 03/11/2015. FINDINGS: Cardiac silhouette is normal in size. Normal mediastinal and hilar contours. Clear lungs.  No pleural effusion or pneumothorax. Skeletal structures unremarkable. IMPRESSION: No active cardiopulmonary disease. Electronically Signed   By: Alm Parkins M.D.   On: 04/12/2024 13:16     Procedures   Medications Ordered  in the ED  oxyCODONE -acetaminophen  (PERCOCET/ROXICET) 5-325 MG per tablet 1 tablet (1 tablet Oral Given 04/12/24 1307)  amoxicillin -clavulanate (AUGMENTIN ) 875-125 MG per tablet 1 tablet (1 tablet Oral Given 04/12/24 1358)  Medical Decision Making This patient presents to the ED with chief complaint(s) of ear pain with no pertinent past medical history which further complicates the presenting complaint. The complaint involves an extensive differential diagnosis and also carries with it a high risk of complications and morbidity.    The differential diagnosis includes otitis media, otitis externa, ruptured TM, viral syndrome, no signs of mastoiditis on exam, pneumonia, pneumothorax, pulmonary edema, pleural effusion, no signs of dehydration on exam  Additional history obtained: Additional history obtained from :family Records reviewed N/A  ED Course and Reassessment: On patient's arrival he is hemodynamically stable in no acute distress.  Exam is consistent with otitis media with ruptured TM.  He is additionally having some shortness of breath and chest x-ray we added on.  COVID, flu and RSV is negative.  He is given pain control and will need to start on antibiotics.  Independent labs interpretation:  The following labs were independently interpreted: Viral swab negative  Independent visualization of imaging: - I independently visualized the following imaging with scope of interpretation limited to determining acute life threatening conditions related to emergency care: Chest x-ray, which revealed no acute disease  Consultation: - Consulted or discussed management/test interpretation w/ external professional: N/A  Consideration for admission or further workup: Patient has no emergent conditions requiring admission or further work-up at this time and is stable for discharge home with primary care and ENT follow-up  Social Determinants of health:  N/A    Amount and/or Complexity of Data Reviewed Radiology: ordered.  Risk Prescription drug management.       Final diagnoses:  Non-recurrent acute suppurative otitis media of right ear with spontaneous rupture of tympanic membrane    ED Discharge Orders          Ordered    amoxicillin -clavulanate (AUGMENTIN ) 875-125 MG tablet  Every 12 hours        04/12/24 1424    ofloxacin  (FLOXIN ) 0.3 % OTIC solution  2 times daily        04/12/24 1424    oxyCODONE  (ROXICODONE ) 5 MG immediate release tablet  Every 6 hours PRN        04/12/24 1424               Kingsley, Karlita Lichtman K, DO 04/12/24 1426

## 2024-04-12 NOTE — Discharge Instructions (Addendum)
 You were seen in the emergency department for your ear pain.  You do have an ear infection that is complicated by a rupture of your eardrum.  I have given you a course of antibiotic pills as well as antibiotic eardrops and you should take these as prescribed.  You can take Tylenol  and Motrin  every 6 hours as needed for pain and I am giving you oxycodone  for breakthrough pain.  This can make you drowsy so do not take it while driving, working or operating heavy machinery. Follow-up with your primary doctor as well as ENT to have your ear rechecked and make sure that it is healing appropriately.  You should return to the emergency department if you are having fevers despite the antibiotics, uncontrollable pain, significant pain or swelling behind your ear or any other new or concerning symptoms.

## 2024-04-12 NOTE — ED Triage Notes (Signed)
 Pt reports chills and dizziness x 5 days pta. Endorses RT ear pain today. Also reports shob

## 2024-04-14 ENCOUNTER — Other Ambulatory Visit (HOSPITAL_BASED_OUTPATIENT_CLINIC_OR_DEPARTMENT_OTHER): Payer: Self-pay

## 2024-04-14 MED ORDER — TRAMADOL HCL 50 MG PO TABS
50.0000 mg | ORAL_TABLET | Freq: Every evening | ORAL | 0 refills | Status: AC | PRN
  Filled 2024-04-14: qty 14, 14d supply, fill #0

## 2024-04-14 MED ORDER — ZOLPIDEM TARTRATE 5 MG PO TABS
5.0000 mg | ORAL_TABLET | Freq: Every evening | ORAL | 5 refills | Status: DC | PRN
  Filled 2024-04-14: qty 30, 30d supply, fill #0

## 2024-04-15 ENCOUNTER — Other Ambulatory Visit (HOSPITAL_BASED_OUTPATIENT_CLINIC_OR_DEPARTMENT_OTHER): Payer: Self-pay

## 2024-04-15 ENCOUNTER — Other Ambulatory Visit: Payer: Self-pay

## 2024-04-27 ENCOUNTER — Other Ambulatory Visit: Payer: Self-pay

## 2024-04-27 MED ORDER — HYDROCODONE-ACETAMINOPHEN 5-325 MG PO TABS
1.0000 | ORAL_TABLET | Freq: Four times a day (QID) | ORAL | 0 refills | Status: DC | PRN
  Filled 2024-04-27: qty 20, 3d supply, fill #0

## 2024-04-30 ENCOUNTER — Emergency Department (HOSPITAL_COMMUNITY): Payer: Self-pay

## 2024-04-30 ENCOUNTER — Other Ambulatory Visit: Payer: Self-pay

## 2024-04-30 ENCOUNTER — Emergency Department (HOSPITAL_COMMUNITY)
Admission: EM | Admit: 2024-04-30 | Discharge: 2024-04-30 | Disposition: A | Discharge status: Home / Self Care | Payer: Self-pay | Attending: Emergency Medicine | Admitting: Emergency Medicine

## 2024-04-30 ENCOUNTER — Encounter (HOSPITAL_COMMUNITY): Payer: Self-pay

## 2024-04-30 DIAGNOSIS — J189 Pneumonia, unspecified organism: Secondary | ICD-10-CM

## 2024-04-30 DIAGNOSIS — R1084 Generalized abdominal pain: Secondary | ICD-10-CM | POA: Insufficient documentation

## 2024-04-30 DIAGNOSIS — R109 Unspecified abdominal pain: Secondary | ICD-10-CM

## 2024-04-30 DIAGNOSIS — J181 Lobar pneumonia, unspecified organism: Secondary | ICD-10-CM | POA: Insufficient documentation

## 2024-04-30 DIAGNOSIS — R0600 Dyspnea, unspecified: Secondary | ICD-10-CM

## 2024-04-30 DIAGNOSIS — J168 Pneumonia due to other specified infectious organisms: Secondary | ICD-10-CM | POA: Insufficient documentation

## 2024-04-30 LAB — BASIC METABOLIC PANEL WITH GFR
Anion gap: 16 — ABNORMAL HIGH (ref 5–15)
BUN: 13 mg/dL (ref 6–20)
CO2: 20 mmol/L — ABNORMAL LOW (ref 22–32)
Calcium: 10 mg/dL (ref 8.9–10.3)
Chloride: 99 mmol/L (ref 98–111)
Creatinine, Ser: 1.3 mg/dL — ABNORMAL HIGH (ref 0.61–1.24)
GFR, Estimated: >60 mL/min (ref >60)
Glucose, Bld: 128 mg/dL — ABNORMAL HIGH (ref 70–99)
Potassium: 4.3 mmol/L (ref 3.5–5.1)
Sodium: 135 mmol/L (ref 135–145)

## 2024-04-30 LAB — CBC WITH DIFFERENTIAL/PLATELET
Abs Immature Granulocytes: 0.01 K/uL (ref 0.00–0.07)
Basophils Absolute: 0 K/uL (ref 0.0–0.1)
Basophils Relative: 1 %
Eosinophils Absolute: 0.1 K/uL (ref 0.0–0.5)
Eosinophils Relative: 2 %
HCT: 39.6 % (ref 39.0–52.0)
Hemoglobin: 13.5 g/dL (ref 13.0–17.0)
Immature Granulocytes: 0 %
Lymphocytes Relative: 34 %
Lymphs Abs: 1.7 K/uL (ref 0.7–4.0)
MCH: 28.2 pg (ref 26.0–34.0)
MCHC: 34.1 g/dL (ref 30.0–36.0)
MCV: 82.7 fL (ref 80.0–100.0)
Monocytes Absolute: 0.6 K/uL (ref 0.1–1.0)
Monocytes Relative: 13 %
Neutro Abs: 2.5 K/uL (ref 1.7–7.7)
Neutrophils Relative %: 50 %
Platelets: 338 K/uL (ref 150–400)
RBC: 4.79 MIL/uL (ref 4.22–5.81)
RDW: 13.5 % (ref 11.5–15.5)
WBC: 4.9 K/uL (ref 4.0–10.5)
nRBC: 0 % (ref 0.0–0.2)

## 2024-04-30 LAB — I-STAT CG4 LACTIC ACID, ED
Lactic Acid, Venous: 1 mmol/L (ref 0.5–1.9)
Lactic Acid, Venous: 1.5 mmol/L (ref 0.5–1.9)

## 2024-04-30 LAB — I-STAT CHEM 8, ED
BUN: 13 mg/dL (ref 6–20)
Calcium, Ion: 1.1 mmol/L — ABNORMAL LOW (ref 1.15–1.40)
Chloride: 102 mmol/L (ref 98–111)
Creatinine, Ser: 1.3 mg/dL — ABNORMAL HIGH (ref 0.61–1.24)
Glucose, Bld: 137 mg/dL — ABNORMAL HIGH (ref 70–99)
HCT: 42 % (ref 39.0–52.0)
Hemoglobin: 14.3 g/dL (ref 13.0–17.0)
Potassium: 4.1 mmol/L (ref 3.5–5.1)
Sodium: 137 mmol/L (ref 135–145)
TCO2: 19 mmol/L — ABNORMAL LOW (ref 22–32)

## 2024-04-30 LAB — URINALYSIS, ROUTINE W REFLEX MICROSCOPIC
Bilirubin Urine: NEGATIVE
Glucose, UA: NEGATIVE mg/dL
Hgb urine dipstick: NEGATIVE
Ketones, ur: NEGATIVE mg/dL
Leukocytes,Ua: NEGATIVE
Nitrite: NEGATIVE
Protein, ur: NEGATIVE mg/dL
Specific Gravity, Urine: >1.06 — ABNORMAL HIGH (ref 1.005–1.030)
pH: 5 (ref 5.0–8.0)

## 2024-04-30 LAB — HEPATIC FUNCTION PANEL
ALT: 14 U/L (ref 0–44)
AST: 26 U/L (ref 15–41)
Albumin: 4.2 g/dL (ref 3.5–5.0)
Alkaline Phosphatase: 71 U/L (ref 38–126)
Bilirubin, Direct: 0.4 mg/dL — ABNORMAL HIGH (ref 0.0–0.2)
Indirect Bilirubin: 1.3 mg/dL — ABNORMAL HIGH (ref 0.3–0.9)
Total Bilirubin: 1.7 mg/dL — ABNORMAL HIGH (ref 0.0–1.2)
Total Protein: 7 g/dL (ref 6.5–8.1)

## 2024-04-30 LAB — TROPONIN T, HIGH SENSITIVITY: Troponin T High Sensitivity: <15 ng/L (ref 0–19)

## 2024-04-30 MED ORDER — DOXYCYCLINE HYCLATE 100 MG PO CAPS: 100.0000 mg | ORAL_CAPSULE | Freq: Two times a day (BID) | ORAL | 0 refills | Status: DC

## 2024-04-30 MED ORDER — CEFUROXIME AXETIL 500 MG PO TABS
500.0000 mg | ORAL_TABLET | Freq: Two times a day (BID) | ORAL | 0 refills | Status: AC
  Filled 2024-04-30: qty 14, 7d supply, fill #0

## 2024-04-30 MED ORDER — CEFUROXIME AXETIL 500 MG PO TABS: 500.0000 mg | ORAL_TABLET | Freq: Two times a day (BID) | ORAL | 0 refills | Status: DC

## 2024-04-30 MED ORDER — IOHEXOL 350 MG/ML SOLN
75.0000 mL | Freq: Once | INTRAVENOUS | Status: AC | PRN
  Administered 2024-04-30: 18:00:00 75 mL via INTRAVENOUS

## 2024-04-30 MED ORDER — IOHEXOL 350 MG/ML SOLN
75.0000 mL | Freq: Once | INTRAVENOUS | Status: AC | PRN
  Administered 2024-04-30: 14:00:00 75 mL via INTRAVENOUS

## 2024-04-30 MED ORDER — SODIUM CHLORIDE 0.9 % IV BOLUS
1000.0000 mL | Freq: Once | INTRAVENOUS | Status: AC
  Administered 2024-04-30: 19:00:00 1000 mL via INTRAVENOUS

## 2024-04-30 MED ORDER — DOXYCYCLINE HYCLATE 100 MG PO CAPS
100.0000 mg | ORAL_CAPSULE | Freq: Two times a day (BID) | ORAL | 0 refills | Status: AC
  Filled 2024-04-30: qty 10, 5d supply, fill #0

## 2024-04-30 NOTE — ED Notes (Signed)
 Pt O2 stayed between 97 and 100% while ambulating. Pt endorses SHOB while ambulating

## 2024-04-30 NOTE — ED Provider Triage Note (Cosign Needed)
 Emergency Medicine Provider Triage Evaluation Note  Eric [name] , a 48 y.o. male  was evaluated in triage.  Pt complains of worsening shortness of breath for the last 2 weeks.  She patient reports walking short distances causes worsening shortness of breath.  Patient is being worked up for a lesion on his tongue concerning for cancer.  Review of Systems  Positive: Shortness of breath, lightheadedness Negative: Chest pain, nausea, vomiting  Physical Exam  BP 137/84 (BP Location: Left Arm)   Pulse (!) 102   Temp (!) 97.5 F (36.4 C)   Resp 20   Ht 5' 5 (1.651 m)   Wt 75.8 kg   SpO2 100%   BMI 27.81 kg/m  Gen:   Awake, no distress   Resp:  Tachypneic and lung fields are clear MSK:   Moves extremities without difficulty  Other:  Superficial phlebitis noted in right leg patient reports this has been worked up in the past  Medical Decision Making  Medically screening exam initiated at 12:51 PM.  Appropriate orders placed.  Eric [name] was informed that the remainder of the evaluation will be completed by another provider, this initial triage assessment does not replace that evaluation, and the importance of remaining in the ED until their evaluation is complete.  PE will be ruled out with CTA.   Eric [name], [place] 04/30/24 1302

## 2024-04-30 NOTE — Discharge Instructions (Addendum)
 Thank for letting us  evaluate you today.  Your CT imaging of your chest did not show any blood clot.  It did show concern for pneumonia in the left upper lobe.  I did provide you with 2 antibiotics for this to take for pneumonia.  I have also provided you with pulmonary specialist follow-up if shortness of breath does not improve following antibiotic cycle for further management.  Call to make an appointment with them.  Your CT of your abdomen did not show any acute abdominal abnormalities. Does show a fatty liver. Your bilirubin on your labs is elevated which could indicate some gallbladder or liver irritation. I would avoid alcohol and tylenol  use. I did provide you with gastroenterology follow-up if you continue to have chronic abdominal pains or complaints.  You may call to make an appointment with them if necessary.  Return to ED if you experience chest pain, shortness of breath, worsening symptoms

## 2024-04-30 NOTE — ED Triage Notes (Addendum)
 Patient reports he has had sob and difficulty breathing x 2 weeks and has been seeing PCP and has appt today but it got to bad.  Also reports growth on tongue that he had biopsied.  Endorses tongue and right ear pain due to biopsy.  Complains of mild abd pain associated with the rapid breathing.  Reports getting out of breath just walking short distances. Reports when he gets short of breath he gets blurry vision.

## 2024-04-30 NOTE — ED Provider Notes (Signed)
 [place] [facility] Provider Note   CSN: [id number] Arrival date & time: 04/30/24  1236     Patient presents with: Shortness of Breath   Eric [name] is a 48 y.o. male with past medical history of GERD, insomnia, restless leg presents Emergency Department for evaluation of shortness of breath that has been worsening over the past 2 weeks.  Reports that shortness of breath is worse with exertion.  No chest pain.  Has had a productive cough and nasal congestion that started yesterday  Also complains of generalized abdominal pain for the past 2 days.  Vomited once on Monday.  Is passing gas.  Last BM was today and normal.  No recent travel no known sick contacts.  Has recently finished Augmentin  for AOM. denies urinary symptoms, rectal bleeding    Shortness of Breath      Prior to Admission medications  Medication Sig Start Date End Date Taking? Authorizing Provider  Albuterol  Sulfate (PROAIR  RESPICLICK) 108 (90 Base) MCG/ACT AEPB Inhale 1 puff into the lungs every 6 (six) hours as needed. 08/23/20   Patel, Sona, MD  amoxicillin  (AMOXIL ) 500 MG capsule Take 1 capsule (500 mg total) by mouth 2 (two) times daily. 01/25/24   Silver Wonda LABOR, PA  amoxicillin -clavulanate (AUGMENTIN ) 875-125 MG tablet Take 1 tablet by mouth every 12 (twelve) hours. 04/12/24   Kingsley, Victoria K, DO  busPIRone  (BUSPAR ) 5 MG tablet Take 1 tablet (5 mg total) by mouth 2 (two) times daily. 03/13/24     cefUROXime  (CEFTIN ) 500 MG tablet Take 1 tablet (500 mg total) by mouth 2 (two) times daily with a meal for 7 days. 04/30/24 05/07/24  Minnie Tinnie BRAVO, PA  celecoxib  (CELEBREX ) 200 MG capsule Take 1 capsule (200 mg total) by mouth 2 (two) times daily as needed. 01/25/24   Silver Wonda LABOR, PA  chlorhexidine  (PERIDEX ) 0.12 % solution Use as directed 15 mLs in the mouth or throat 2 (two) times daily. 01/25/24   Silver Wonda LABOR, PA  dicyclomine  (BENTYL ) 20 MG tablet Take 1  tablet (20 mg total) by mouth 2 (two) times daily as needed. 07/22/23   Silver Wonda LABOR, PA  doxycycline  (VIBRAMYCIN ) 100 MG capsule Take 1 capsule (100 mg total) by mouth 2 (two) times daily for 5 days. 04/30/24 05/05/24  Minnie Tinnie BRAVO, PA  HYDROcodone -acetaminophen  (NORCO/VICODIN) 5-325 MG tablet Take 1-2 tablets by mouth every 6 (six) hours as needed for severe pain (7-10). 04/27/24     ibuprofen  (ADVIL ) 200 MG tablet Take 200 mg by mouth every 6 (six) hours as needed for mild pain.    [provider]  magic mouthwash (nystatin , hydrocortisone , diphenhydrAMINE ) suspension Take 5 mLs by mouth 4 (four) times daily. 03/24/24     ofloxacin  (FLOXIN ) 0.3 % OTIC solution Place 10 drops into the right ear 2 (two) times daily. 04/12/24   Kingsley, Victoria K, DO  ondansetron  (ZOFRAN -ODT) 4 MG disintegrating tablet Take 1 tablet (4 mg total) by mouth every 8 (eight) hours as needed. 07/22/23   Silver Wonda LABOR, PA  oseltamivir  (TAMIFLU ) 75 MG capsule Take 1 capsule (75 mg total) by mouth every 12 (twelve) hours. 05/29/23   Tegeler, Lonni PARAS, MD  oxyCODONE  (ROXICODONE ) 5 MG immediate release tablet Take 1 tablet (5 mg total) by mouth every 6 (six) hours as needed. 04/12/24   Kingsley, Victoria K, DO  pantoprazole  (PROTONIX ) 40 MG tablet Take 1 tablet (40 mg total) by mouth daily. 07/22/23  Silver Fell A, PA  pantoprazole  (PROTONIX ) 40 MG tablet Take 1 tablet (40 mg total) by mouth daily. 03/13/24     traMADol  (ULTRAM ) 50 MG tablet Take 1 tablet (50 mg total) by mouth nightly as needed for moderate pain (4-6). 03/13/24     traMADol  (ULTRAM ) 50 MG tablet Take 1 tablet (50 mg total) by mouth at bedtime as needed. 04/14/24     venlafaxine  XR (EFFEXOR -XR) 75 MG 24 hr capsule Take 2 capsules (150 mg total) by mouth daily. 10/02/19   Goodman, Graydon, MD  venlafaxine  XR (EFFEXOR -XR) 75 MG 24 hr capsule Take 3 capsules (225 mg total) by mouth daily. 03/13/24     zolpidem  (AMBIEN ) 10 MG tablet Take  10 mg by mouth at bedtime as needed for sleep.    [provider]  zolpidem  (AMBIEN ) 5 MG tablet Take 1 tablet (5 mg total) by mouth at bedtime as needed. 04/14/24       Allergies: Patient has no known allergies.    Review of Systems  Respiratory:  Positive for shortness of breath.     Updated Vital Signs BP 103/68 (BP Location: Left Arm)   Pulse 71   Temp 98.2 F (36.8 C) (Oral)   Resp 18   Ht 5' 5 (1.651 m)   Wt 75.8 kg   SpO2 98%   BMI 27.81 kg/m   Physical Exam Vitals and nursing note reviewed.  Constitutional:      General: He is not in acute distress.    Appearance: Normal appearance.  HENT:     Head: Normocephalic and atraumatic.  Eyes:     Conjunctiva/sclera: Conjunctivae normal.  Cardiovascular:     Rate and Rhythm: Normal rate.  Pulmonary:     Effort: Pulmonary effort is normal. No respiratory distress.     Breath sounds: Examination of the left-upper field reveals decreased breath sounds. Decreased breath sounds present.  Abdominal:     Tenderness: There is generalized abdominal tenderness.     Comments: Nonsurgical abd with no peritoneal signs  Skin:    Coloration: Skin is not jaundiced or pale.  Neurological:     Mental Status: He is alert. Mental status is at baseline.     (all labs ordered are listed, but only abnormal results are displayed) Labs Reviewed  BASIC METABOLIC PANEL WITH GFR - Abnormal; Notable for the following components:      Result Value   CO2 20 (*)    Glucose, Bld 128 (*)    Creatinine, Ser 1.30 (*)    Anion gap 16 (*)    All other components within normal limits  HEPATIC FUNCTION PANEL - Abnormal; Notable for the following components:   Total Bilirubin 1.7 (*)    Bilirubin, Direct 0.4 (*)    Indirect Bilirubin 1.3 (*)    All other components within normal limits  URINALYSIS, ROUTINE W REFLEX MICROSCOPIC - Abnormal; Notable for the following components:   Specific Gravity, Urine >1.060 (*)    All other components  within normal limits  I-STAT CHEM 8, ED - Abnormal; Notable for the following components:   Creatinine, Ser 1.30 (*)    Glucose, Bld 137 (*)    Calcium, Ion 1.10 (*)    TCO2 19 (*)    All other components within normal limits  CBC WITH DIFFERENTIAL/PLATELET  I-STAT CG4 LACTIC ACID, ED  I-STAT CG4 LACTIC ACID, ED  TROPONIN T, HIGH SENSITIVITY    EKG: EKG Interpretation Date/Time:  Thursday April 30 2024  13:04:55 EST Ventricular Rate:  101 PR Interval:  132 QRS Duration:  74 QT Interval:  314 QTC Calculation: 407 R Axis:   59  Text Interpretation: Sinus tachycardia Otherwise normal ECG No significant change since last tracing Confirmed by Ellouise Fine ([id number]) on 04/30/2024 5:19:10 PM  Radiology: CT ABDOMEN PELVIS W CONTRAST Result Date: 04/30/2024 EXAM: CT ABDOMEN AND PELVIS WITH CONTRAST 04/30/2024 05:46:00 PM TECHNIQUE: CT of the abdomen and pelvis was performed with the administration of 75 mL of iohexol  (OMNIPAQUE ) 350 MG/ML injection. Multiplanar reformatted images are provided for review. Automated exposure control, iterative reconstruction, and/or weight-based adjustment of the mA/kV was utilized to reduce the radiation dose to as low as reasonably achievable. COMPARISON: CT abdomen and pelvis 07/22/2023. CLINICAL HISTORY: Abdominal pain, acute, nonlocalized. FINDINGS: LOWER CHEST: There is a 2 mm nodule in the left lower lobe on image 4/4. LIVER: There is diffuse fatty infiltration of the liver, unchanged. GALLBLADDER AND BILE DUCTS: Gallbladder is unremarkable. No biliary ductal dilatation. SPLEEN: No acute abnormality. PANCREAS: No acute abnormality. ADRENAL GLANDS: No acute abnormality. KIDNEYS, URETERS AND BLADDER: No stones in the kidneys or ureters. No hydronephrosis. No perinephric or periureteral stranding. Urinary bladder is unremarkable. GI AND BOWEL: Gastric band in place. Small hiatal hernia is again noted. There is wall thickening of the distal esophagus similar  to the prior study. The appendix is not visualized. There is no bowel obstruction. PERITONEUM AND RETROPERITONEUM: No ascites. No free air. VASCULATURE: Aorta is normal in caliber. LYMPH NODES: No lymphadenopathy. REPRODUCTIVE ORGANS: No acute abnormality. BONES AND SOFT TISSUES: No acute osseous abnormality. No focal soft tissue abnormality. IMPRESSION: 1. Small hiatal hernia and wall thickening of the distal esophagus, similar to the prior study. Correlate clinically for esophagitis . 2. 2 mm left lower lobe pulmonary nodule; no routine follow-up imaging is recommended per Fleischner Society Guidelines. 3. Diffuse hepatic steatosis, unchanged. Electronically signed by: Greig Pique MD 04/30/2024 06:17 PM EST RP Workstation: [id number]   CT Angio Chest PE W and/or Wo Contrast Result Date: 04/30/2024 CLINICAL DATA:  Concern for pulmonary embolism. EXAM: CT ANGIOGRAPHY CHEST WITH CONTRAST TECHNIQUE: Multidetector CT imaging of the chest was performed using the standard protocol during bolus administration of intravenous contrast. Multiplanar CT image reconstructions and MIPs were obtained to evaluate the vascular anatomy. RADIATION DOSE REDUCTION: This exam was performed according to the departmental dose-optimization program which includes automated exposure control, adjustment of the mA and/or kV according to patient size and/or use of iterative reconstruction technique. CONTRAST:  75mL OMNIPAQUE  IOHEXOL  350 MG/ML SOLN COMPARISON:  Chest CT dated 03/11/2015. FINDINGS: Cardiovascular: There is no cardiomegaly or pericardial effusion. The thoracic aorta is unremarkable. The origins of the great vessels of the aortic arch are patent. No pulmonary artery embolus identified. Mediastinum/Nodes: No hilar or mediastinal adenopathy. Mild circumferential thickening of the distal esophagus may be related to underdistention or represent esophagitis related to reflux. Endoscopy may provide better evaluation if there is  clinical concern for underlying esophageal malignancy. Small amount of fluid content noted within the esophagus. Lungs/Pleura: Faint clusters of ground-glass density in the left upper lobe and lingula concerning for atypical infiltrate. No consolidative changes. There is no pleural effusion pneumothorax. The central airways are patent. Upper Abdomen: Gastric lap band. Musculoskeletal: No acute osseous pathology. Review of the MIP images confirms the above findings. IMPRESSION: 1. No CT evidence of pulmonary artery embolus. 2. Faint clusters of ground-glass density in the left upper lobe and lingula concerning for atypical infiltrate. 3. Mild  circumferential thickening of the distal esophagus may be related to underdistention or represent esophagitis related to reflux. Electronically Signed   By: Vanetta Chou M.D.   On: 04/30/2024 14:08   DG Chest 2 View Result Date: 04/30/2024 CLINICAL DATA:  Shortness of breath 2 weeks EXAM: CHEST - 2 VIEW COMPARISON:  April 12, 2024 FINDINGS: The heart size and mediastinal contours are within normal limits. Both lungs are clear. The visualized skeletal structures are unremarkable. IMPRESSION: No active cardiopulmonary disease. Electronically Signed   By: Lynwood Landy Raddle M.D.   On: 04/30/2024 13:48    Medications Ordered in the ED  iohexol  (OMNIPAQUE ) 350 MG/ML injection 75 mL (75 mLs Intravenous Contrast Given 04/30/24 1330)  iohexol  (OMNIPAQUE ) 350 MG/ML injection 75 mL (75 mLs Intravenous Contrast Given 04/30/24 1747)  sodium chloride  0.9 % bolus 1,000 mL (0 mLs Intravenous Stopped 04/30/24 2050)                                    Medical Decision Making Amount and/or Complexity of Data Reviewed Labs: ordered. Radiology: ordered.  Risk Prescription drug management.   Patient presents to the ED for concern of abd pain, shob, cough, congestion, this involves an extensive number of treatment options, and is a complaint that carries with it a high  risk of complications and morbidity.  The differential diagnosis includes COVID, flu, RSV, PNA, fluid overload, symptomatic anemia, ACS, electrolyte abnormality    Co morbidities that complicate the patient evaluation  Tongue lesion currently being workup up for neoplasm   Additional history obtained:  Additional history obtained from Nursing   External records from outside source obtained and reviewed including triage RN note   Lab Tests:  I Ordered, and personally interpreted labs.  The pertinent results include:   Creatinine 1.30 AG 16 Trop negative x2 No leukocytosis. LA WNL UA wo infection Total bili 1.7 per baseline   Imaging Studies ordered:  I ordered imaging studies including CTPE, CT abd pelvis  I independently visualized and interpreted imaging which showed  Small hiatal hernia and wall thickening of the distal esophagus, similar to the prior study. 2 mm left lower lobe pulmonary nodule; no routine follow-up imaging is recommended per Fleischner Society Guidelines. Diffuse hepatic steatosis, unchanged. No CT evidence of pulmonary artery embolus. Faint clusters of ground-glass density in the left upper lobe and lingula concerning for atypical infiltrate I agree with the radiologist interpretation   Cardiac Monitoring:  The patient was maintained on a cardiac monitor.  I personally viewed and interpreted the cardiac monitored which showed an underlying rhythm of: ST at 101bpm with no ischemic, ST nor T wave abnormalities   Medicines ordered and prescription drug management:  I ordered medication including NS, cefuroxime , doxy for LUL PNA, hydration  Reevaluation of the patient after these medicines showed that the patient stayed the same I have reviewed the patients home medicines and have made adjustments as needed    Problem List / ED Course:  Dyspnea LUL PNA Vital signs hemodynamically stable no fever nor tachycardia.  Meeting oxygen saturation  without supplementation.  No signs of acute respiratory distress Lung sounds notable for decreased lung sounds in left upper lobe but otherwise clear No complaints of chest pain or pedal edema.  Does not appear fluid overloaded on exam. Cardiac workup is reassuring with EKG without ischemic changes.  Troponin negative x 2. PE not consistent with dissection  with no cp nor back pain Did obtain CT PE study as patient was complaining of shortness of breath with mild tachycardia 101 bpm and possible cancerous lesion to right side of tongue.  Fortunately, this is negative for PE. Ambulated patient with no desaturations nor tachycardia.  Maintained oxygen saturation throughout entirety of ED visit. As patient is currently being worked up for possible neoplasm of his left side of his tongue, as well as a 2 mm nodule and infiltrate on CT imaging today, I did provide him with pulmonology follow-up for further management if symptoms do not improve following abx course Provided prescription for ceftin , doxy for PNA as he recently finished amox and augmentin  for dental infection prophylaxis and AOM within last 3 mo  Abd pain Patient has generalized abdominal tenderness with no focal tenderness. Not clinically obstructed No leukocytosis.  No sign of systemic infection nor sepsis Creatinine 1.30 per patient's baseline.  Does have mildly elevated AG of 16 with no signs of sepsis nor hyperglycemia.  This in conjunction with concentrated urine may be secondary to starvation ketoacidosis at maximum so did provide NS bolus which will decrease AG and hydrate patient.  Also has elevated T. bili levels per his baseline.  Does have hepatic steatosis which is unchanged.  Did provide patient with gastroenterology follow-up for further management of abdominal pain, elevated T. bili enzymes if abdominal pain does not resolve Does have a port in his abdomen with no surrounding erythema, warmth, swelling, nor fluctuance.  Low  suspicion of abscess. Symptoms may be secondary to gastroenteritis, food poisoning versus finishing of recent antibiotics.  At this time, patient appears stable with no acute emergent etiology of abdominal pain with reassuring abdominal pain workup. CT without acute intra-abdominal inflammation or infection requiring emergent treatment. Passes PO challenge   Reevaluation:  After the interventions noted above, I reevaluated the patient and found that they have :improved     Dispostion:  After consideration of the diagnostic results and the patients response to treatment, I feel that the patent would benefit from outpatient management with abx for PNA, pulmonology f/u, GI f/u.   Discussed ED workup, disposition, return to ED precautions with patient who expresses understanding agrees with plan.  All questions answered to their satisfaction.  They are agreeable to plan.  Discharge instructions provided on paperwork  Final diagnoses:  Pneumonia of left upper lobe due to infectious organism  Dyspnea, unspecified type  Abdominal pain, unspecified abdominal location    ED Discharge Orders          Ordered    cefUROXime  (CEFTIN ) 500 MG tablet  2 times daily with meals,   Status:  Discontinued        04/30/24 1958    doxycycline  (VIBRAMYCIN ) 100 MG capsule  2 times daily,   Status:  Discontinued        04/30/24 1958    cefUROXime  (CEFTIN ) 500 MG tablet  2 times daily with meals        04/30/24 2057    doxycycline  (VIBRAMYCIN ) 100 MG capsule  2 times daily        04/30/24 2057             Minnie Tinnie BRAVO, PA 04/30/24 2318    Ellouise Fine K, DO 04/30/24 2343

## 2024-05-01 ENCOUNTER — Other Ambulatory Visit: Payer: Self-pay

## 2024-05-12 ENCOUNTER — Other Ambulatory Visit: Payer: Self-pay

## 2024-05-12 MED ORDER — HYDROCODONE-ACETAMINOPHEN 5-325 MG PO TABS: 1.0000 | ORAL_TABLET | Freq: Four times a day (QID) | ORAL | 0 refills | Status: AC | PRN

## 2024-05-12 MED ORDER — ZOLPIDEM TARTRATE 5 MG PO TABS
5.0000 mg | ORAL_TABLET | Freq: Every evening | ORAL | 5 refills | Status: AC | PRN
  Filled 2024-05-15: qty 30, 30d supply, fill #0
  Filled 2024-06-12: qty 30, 30d supply, fill #1

## 2024-05-15 ENCOUNTER — Other Ambulatory Visit: Payer: Self-pay

## 2024-05-15 MED ORDER — OXYCODONE HCL 5 MG PO TABS
5.0000 mg | ORAL_TABLET | Freq: Four times a day (QID) | ORAL | 0 refills | Status: AC | PRN
  Filled 2024-05-15: qty 40, 10d supply, fill #0

## 2024-05-19 DIAGNOSIS — C760 Malignant neoplasm of head, face and neck: Secondary | ICD-10-CM

## 2024-05-21 ENCOUNTER — Ambulatory Visit
Admission: RE | Admit: 2024-05-21 | Discharge: 2024-05-21 | Disposition: A | Discharge status: Home / Self Care | Payer: Self-pay | Source: Ambulatory Visit | Attending: Otolaryngology | Admitting: Otolaryngology

## 2024-05-21 DIAGNOSIS — C760 Malignant neoplasm of head, face and neck: Secondary | ICD-10-CM

## 2024-06-08 ENCOUNTER — Other Ambulatory Visit: Payer: Self-pay

## 2024-06-08 MED ORDER — AMOXICILLIN-POT CLAVULANATE 875-125 MG PO TABS
1.0000 | ORAL_TABLET | Freq: Two times a day (BID) | ORAL | 0 refills | Status: AC
  Filled 2024-06-08: qty 28, 14d supply, fill #0

## 2024-06-12 ENCOUNTER — Other Ambulatory Visit: Payer: Self-pay

## 2024-06-12 ENCOUNTER — Other Ambulatory Visit (HOSPITAL_BASED_OUTPATIENT_CLINIC_OR_DEPARTMENT_OTHER): Payer: Self-pay

## 2024-06-18 ENCOUNTER — Encounter (HOSPITAL_BASED_OUTPATIENT_CLINIC_OR_DEPARTMENT_OTHER): Payer: Self-pay

## 2024-06-18 ENCOUNTER — Emergency Department (HOSPITAL_BASED_OUTPATIENT_CLINIC_OR_DEPARTMENT_OTHER): Admitting: Radiology

## 2024-06-18 ENCOUNTER — Other Ambulatory Visit: Payer: Self-pay

## 2024-06-18 ENCOUNTER — Emergency Department (HOSPITAL_BASED_OUTPATIENT_CLINIC_OR_DEPARTMENT_OTHER)
Admission: EM | Admit: 2024-06-18 | Discharge: 2024-06-18 | Disposition: A | Discharge status: Home / Self Care | Source: Home / Self Care | Attending: Emergency Medicine | Admitting: Emergency Medicine

## 2024-06-18 DIAGNOSIS — R079 Chest pain, unspecified: Secondary | ICD-10-CM

## 2024-06-18 LAB — CBC
HCT: 34.5 % — ABNORMAL LOW (ref 39.0–52.0)
Hemoglobin: 11.5 g/dL — ABNORMAL LOW (ref 13.0–17.0)
MCH: 28.7 pg (ref 26.0–34.0)
MCHC: 33.3 g/dL (ref 30.0–36.0)
MCV: 86 fL (ref 80.0–100.0)
Platelets: 306 10*3/uL (ref 150–400)
RBC: 4.01 MIL/uL — ABNORMAL LOW (ref 4.22–5.81)
RDW: 14.7 % (ref 11.5–15.5)
WBC: 6.5 10*3/uL (ref 4.0–10.5)
nRBC: 0 % (ref 0.0–0.2)

## 2024-06-18 LAB — BASIC METABOLIC PANEL WITH GFR
Anion gap: 12 (ref 5–15)
BUN: 13 mg/dL (ref 6–20)
CO2: 25 mmol/L (ref 22–32)
Calcium: 9.6 mg/dL (ref 8.9–10.3)
Chloride: 101 mmol/L (ref 98–111)
Creatinine, Ser: 0.99 mg/dL (ref 0.61–1.24)
GFR, Estimated: >60 mL/min
Glucose, Bld: 156 mg/dL — ABNORMAL HIGH (ref 70–99)
Potassium: 4.1 mmol/L (ref 3.5–5.1)
Sodium: 137 mmol/L (ref 135–145)

## 2024-06-18 LAB — D-DIMER, QUANTITATIVE: D-Dimer, Quant: 0.4 ug{FEU}/mL (ref 0.00–0.50)

## 2024-06-18 LAB — TROPONIN T, HIGH SENSITIVITY
Troponin T High Sensitivity: 8 ng/L (ref 0–19)
Troponin T High Sensitivity: 7 ng/L (ref 0–19)

## 2024-06-18 MED ORDER — ASPIRIN 81 MG PO CHEW
324.0000 mg | CHEWABLE_TABLET | Freq: Once | ORAL | Status: AC
  Administered 2024-06-18: 324 mg via ORAL
  Filled 2024-06-18: qty 4

## 2024-06-18 NOTE — ED Triage Notes (Signed)
 Presents to ED with c/o chest pain. Started a few days ago on R side. Is now on L side and constant. EKG done in triage.

## 2024-06-18 NOTE — Discharge Instructions (Addendum)
 You were evaluated in the emergency department today for chest pain. Your EKG was normal did not show abnormality of your heart. You also had lab work that did not show results that suggest an injury to your heart. I recommend you follow-up with your primary care doctor and a cardiologist to further evaluate your risk of cardiac disease. I have placed a referral for Cardiology.  They should call you to schedule an appointment. You pain may be related to heartburn or the muscles in your chest.  You may try Tylenol  and over the counter Tums to help with this.

## 2024-06-18 NOTE — ED Provider Notes (Cosign Needed Addendum)
 " [place] EMERGENCY DEPARTMENT AT [facility] Provider Note   CSN: [id number] Arrival date & time: 06/18/24  9082     Patient presents with: Chest Pain   Eric [name] is a 49 y.o. male.   He presents with chest pain.  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 No language interpreter was used.       Prior to Admission medications  Medication Sig Start Date End Date Taking? Authorizing Provider  Albuterol  Sulfate (PROAIR  RESPICLICK) 108 (90 Base) MCG/ACT AEPB Inhale 1 puff into the lungs every 6 (six) hours as needed. 08/23/20   Patel, Sona, MD  amoxicillin  (AMOXIL ) 500 MG capsule Take 1 capsule (500 mg total) by mouth 2 (two) times daily. 01/25/24   Silver Wonda LABOR, PA  amoxicillin -clavulanate (AUGMENTIN ) 875-125 MG tablet Take 1 tablet by mouth every 12 (twelve) hours. 04/12/24   Kingsley, Victoria K, DO  amoxicillin -clavulanate (AUGMENTIN ) 875-125 MG tablet Take 1 tablet by mouth 2 (two) times daily FOR 14 DAYS. 06/08/24     busPIRone   (BUSPAR ) 5 MG tablet Take 1 tablet (5 mg total) by mouth 2 (two) times daily. 03/13/24     celecoxib  (CELEBREX ) 200 MG capsule Take 1 capsule (200 mg total) by mouth 2 (two) times daily as needed. 01/25/24   Silver Wonda LABOR, PA  chlorhexidine  (PERIDEX ) 0.12 % solution Use as directed 15 mLs in the mouth or throat 2 (two) times daily. 01/25/24   Silver Wonda LABOR, PA  dicyclomine  (BENTYL ) 20 MG tablet Take 1 tablet (20 mg total) by mouth 2 (two) times daily as needed. 07/22/23   Silver Wonda LABOR, PA  HYDROcodone -acetaminophen  (NORCO/VICODIN) 5-325 MG tablet Take 1-2 tablets by mouth every 6 (six) hours as needed. 05/12/24     ibuprofen  (ADVIL ) 200 MG tablet Take 200 mg by mouth every 6 (six) hours as needed for mild pain.    [provider]  magic mouthwash (nystatin , hydrocortisone , diphenhydrAMINE ) suspension Take 5 mLs by mouth 4 (four) times daily. 03/24/24     ofloxacin  (FLOXIN ) 0.3 % OTIC solution Place 10 drops into the right ear 2 (two) times daily. 04/12/24   Kingsley, Victoria K, DO  ondansetron  (ZOFRAN -ODT) 4 MG disintegrating tablet Take 1 tablet (4 mg total) by mouth every 8 (eight) hours as needed. 07/22/23   Silver Wonda LABOR, PA  oseltamivir  (TAMIFLU ) 75 MG capsule Take 1 capsule (75 mg total) by mouth every 12 (twelve) hours. 05/29/23   Tegeler, Lonni PARAS, MD  oxyCODONE  (OXY IR/ROXICODONE ) 5 MG immediate release tablet Take 1 tablet (5 mg total) by mouth every  six (6) hours as needed for pain. 05/15/24     oxyCODONE  (ROXICODONE ) 5 MG immediate release tablet Take 1 tablet (5 mg total) by mouth every 6 (six) hours as needed. 04/12/24   Kingsley, Victoria K, DO  pantoprazole  (PROTONIX ) 40 MG tablet Take 1 tablet (40 mg total) by mouth daily. 07/22/23   Silver Wonda LABOR, PA  pantoprazole  (PROTONIX ) 40 MG tablet Take 1 tablet (40 mg total) by mouth daily. 03/13/24     traMADol  (ULTRAM ) 50 MG tablet Take 1 tablet (50 mg total) by mouth nightly as needed for moderate pain (4-6).  03/13/24     traMADol  (ULTRAM ) 50 MG tablet Take 1 tablet (50 mg total) by mouth at bedtime as needed. 04/14/24     venlafaxine  XR (EFFEXOR -XR) 75 MG 24 hr capsule Take 2 capsules (150 mg total) by mouth daily. 10/02/19   Goodman, Graydon, MD  venlafaxine  XR (EFFEXOR -XR) 75 MG 24 hr capsule Take 3 capsules (225 mg total) by mouth daily. 03/13/24     zolpidem  (AMBIEN ) 10 MG tablet Take 10 mg by mouth at bedtime as needed for sleep.    [provider]  zolpidem  (AMBIEN ) 5 MG tablet Take 1 tablet (5 mg total) by mouth nightly as needed for sleep. 05/12/24       Allergies: Patient has no known allergies.    Review of Systems  Updated Vital Signs BP 127/84   Pulse 90   Temp 99.9 F (37.7 C) (Oral)   Resp 17   Ht 5' 5 (1.651 m)   Wt 63.5 kg   SpO2 97%   BMI 23.30 kg/m   Physical Exam Vitals and nursing note reviewed.  Constitutional:      General: He is not in acute distress.    Appearance: He is well-developed.  HENT:     Head: Normocephalic and atraumatic.  Eyes:     Conjunctiva/sclera: Conjunctivae normal.  Cardiovascular:     Rate and Rhythm: Regular rhythm. Tachycardia present.     Heart sounds: No murmur heard. Pulmonary:     Effort: Pulmonary effort is normal. No respiratory distress.     Breath sounds: Normal breath sounds.  Abdominal:     Palpations: Abdomen is soft.     Tenderness: There is no abdominal tenderness.  Musculoskeletal:        General: No swelling.     Cervical back: Neck supple.  Skin:    General: Skin is warm and dry.     Capillary Refill: Capillary refill takes less than 2 seconds.  Neurological:     Mental Status: He is alert.  Psychiatric:        Mood and Affect: Mood normal.     (all labs ordered are listed, but only abnormal results are displayed) Labs Reviewed  BASIC METABOLIC PANEL WITH GFR - Abnormal; Notable for the following components:      Result Value   Glucose, Bld 156 (*)    All other components within normal limits   CBC - Abnormal; Notable for the following components:   RBC 4.01 (*)    Hemoglobin 11.5 (*)    HCT 34.5 (*)    All other components within normal limits  D-DIMER, QUANTITATIVE  TROPONIN T, HIGH SENSITIVITY  TROPONIN T, HIGH SENSITIVITY    EKG: None  Radiology: DG Chest 2 View Result Date: 06/18/2024 CLINICAL DATA:  Chest pain EXAM: CHEST - 2 VIEW COMPARISON:  April 30, 2024 FINDINGS: The heart size and mediastinal contours are within  normal limits. Both lungs are clear. The visualized skeletal structures are unremarkable. IMPRESSION: No active cardiopulmonary disease. Electronically Signed   By: Lynwood Landy Raddle M.D.   On: 06/18/2024 10:23     Procedures   Medications Ordered in the ED  aspirin  chewable tablet 324 mg (324 mg Oral Given 06/18/24 1016)    Clinical Course as of 06/18/24 1245  Thu Jun 18, 2024  1005 DG Chest 2 View Personally reviewed, no obvious acute pulmonary or cardiac abnormality. [MQ]  1007 EKG 12-Lead [MQ]  1010 Basic metabolic panel(!) Mildly elevated glucose, otherwise normal BMP [MQ]  1010 CBC(!) Mild anemia. [MQ]  1100 D-dimer, quantitative Negative D-dimer [MQ]  1205 Troponin T, High Sensitivity Delta troponin normal.  Not consistent with cardiac etiology of his pain.  Recommend follow-up with cardiology for further evaluation of his risk of cardiac disease. [MQ]    Clinical Course User Index [MQ] Alba Sharper, MD             HEART Score: 3                    Medical Decision Making 49 y.o. 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 Clinical description is concerning for ACS; however, EKG and delta troponins today were reassuringly normal. Heart Score 3. CXR and other labs also reassuring without major abnormality or obvious cause of patient pain. D-dimer also negative, making PE unlikely. Seems unlikely to be related to cancer history. Given  characteristics of pain will refer to cardiology for follow-up and further evaluation of cardiac disease. . Stable for discharge at this time, patient agreeable to plan. Can trial Tylenol  and Tums for pain.  Amount and/or Complexity of Data Reviewed Labs: ordered. Decision-making details documented in ED Course. Radiology: ordered. Decision-making details documented in ED Course. ECG/medicine tests:  Decision-making details documented in ED Course.  Risk OTC drugs.       Final diagnoses:  Chest pain, unspecified type    ED Discharge Orders          Ordered    Ambulatory referral to Cardiology       Comments: If you have not heard from the Cardiology office within the next 72 hours please call [phone].   06/18/24 1214               Alba Sharper, MD 06/18/24 1247    Alba Sharper, MD 06/18/24 1554  "
